# Patient Record
Sex: Male | Born: 1975 | Race: White | Hispanic: No | Marital: Single | Smoking: Never smoker
Health system: Southern US, Community
[De-identification: ages and names within clinical notes are randomized; demographics above are authoritative.]

## PROBLEM LIST (undated history)

## (undated) DIAGNOSIS — E785 Hyperlipidemia, unspecified: Secondary | ICD-10-CM

## (undated) DIAGNOSIS — D649 Anemia, unspecified: Secondary | ICD-10-CM

## (undated) DIAGNOSIS — E119 Type 2 diabetes mellitus without complications: Secondary | ICD-10-CM

## (undated) DIAGNOSIS — I4510 Unspecified right bundle-branch block: Secondary | ICD-10-CM

## (undated) DIAGNOSIS — I10 Essential (primary) hypertension: Secondary | ICD-10-CM

## (undated) HISTORY — PX: APPENDECTOMY: SHX54

## (undated) HISTORY — PX: TONSILLECTOMY: SUR1361

## (undated) HISTORY — DX: Essential (primary) hypertension: I10

## (undated) HISTORY — DX: Hyperlipidemia, unspecified: E78.5

## (undated) HISTORY — PX: COLONOSCOPY: SHX174

---

## 2018-08-20 ENCOUNTER — Telehealth: Payer: Self-pay | Admitting: Internal Medicine

## 2018-08-20 NOTE — Telephone Encounter (Signed)
Copied from CRM [id number]. Topic: General - Inquiry >> Aug 20, 2018 12:19 PM Donita Brooks wrote: Reason for CRM: Pt called wanting to know if Dr. Yetta Barre will accept him as a new pt. Pt was referred by Marla Roe

## 2018-08-20 NOTE — Telephone Encounter (Signed)
yes

## 2018-08-20 NOTE — Telephone Encounter (Signed)
Yes, I will see him  TJ 

## 2018-08-21 NOTE — Telephone Encounter (Signed)
Scheduled for tomorrow at 1:30, I put it in as an OV

## 2018-08-22 ENCOUNTER — Ambulatory Visit: Payer: Self-pay | Admitting: Internal Medicine

## 2018-08-27 DIAGNOSIS — I10 Essential (primary) hypertension: Secondary | ICD-10-CM | POA: Insufficient documentation

## 2018-09-02 DIAGNOSIS — E1165 Type 2 diabetes mellitus with hyperglycemia: Secondary | ICD-10-CM | POA: Insufficient documentation

## 2019-07-01 DIAGNOSIS — F900 Attention-deficit hyperactivity disorder, predominantly inattentive type: Secondary | ICD-10-CM | POA: Insufficient documentation

## 2021-04-07 ENCOUNTER — Other Ambulatory Visit: Payer: Self-pay

## 2021-04-07 ENCOUNTER — Emergency Department (HOSPITAL_BASED_OUTPATIENT_CLINIC_OR_DEPARTMENT_OTHER): Payer: 59

## 2021-04-07 ENCOUNTER — Emergency Department (HOSPITAL_BASED_OUTPATIENT_CLINIC_OR_DEPARTMENT_OTHER)
Admission: EM | Admit: 2021-04-07 | Discharge: 2021-04-07 | Disposition: A | Discharge status: Home / Self Care | Payer: 59 | Attending: Emergency Medicine | Admitting: Emergency Medicine

## 2021-04-07 ENCOUNTER — Encounter (HOSPITAL_BASED_OUTPATIENT_CLINIC_OR_DEPARTMENT_OTHER): Payer: Self-pay | Admitting: *Deleted

## 2021-04-07 DIAGNOSIS — Z23 Encounter for immunization: Secondary | ICD-10-CM | POA: Diagnosis not present

## 2021-04-07 DIAGNOSIS — W25XXXA Contact with sharp glass, initial encounter: Secondary | ICD-10-CM | POA: Insufficient documentation

## 2021-04-07 DIAGNOSIS — S81811A Laceration without foreign body, right lower leg, initial encounter: Secondary | ICD-10-CM | POA: Diagnosis not present

## 2021-04-07 DIAGNOSIS — S8991XA Unspecified injury of right lower leg, initial encounter: Secondary | ICD-10-CM | POA: Diagnosis present

## 2021-04-07 DIAGNOSIS — T1490XA Injury, unspecified, initial encounter: Secondary | ICD-10-CM

## 2021-04-07 MED ORDER — BUPIVACAINE HCL (PF) 0.5 % IJ SOLN
10.0000 mL | Freq: Once | INTRAMUSCULAR | Status: AC
  Administered 2021-04-07: 10 mL
  Filled 2021-04-07: qty 10

## 2021-04-07 MED ORDER — LIDOCAINE-EPINEPHRINE (PF) 2 %-1:200000 IJ SOLN
10.0000 mL | Freq: Once | INTRAMUSCULAR | Status: AC
  Administered 2021-04-07: 10 mL
  Filled 2021-04-07: qty 20

## 2021-04-07 MED ORDER — CEPHALEXIN 500 MG PO CAPS: 500.0000 mg | ORAL_CAPSULE | Freq: Four times a day (QID) | ORAL | 0 refills | Status: DC

## 2021-04-07 MED ORDER — TETANUS-DIPHTH-ACELL PERTUSSIS 5-2.5-18.5 LF-MCG/0.5 IM SUSY
0.5000 mL | PREFILLED_SYRINGE | Freq: Once | INTRAMUSCULAR | Status: AC
  Administered 2021-04-07: 0.5 mL via INTRAMUSCULAR
  Filled 2021-04-07: qty 0.5

## 2021-04-07 NOTE — ED Provider Notes (Signed)
[facility] EMERGENCY DEPT Provider Note   CSN: [id number] Arrival date & time: 04/07/21  1300     History Chief Complaint  Patient presents with   Laceration    Brandon Ruiz is a 45 y.o. male with no pertinent past medical history, unknown last tetanus shot, who presents today for evaluation of a laceration to his right calf.  He states that prior to arrival he accidentally cut his posterior right leg on a broken light bulb. He denies any weakness, numbness or tingling.  He states that the broken bulb was in the trash can and he has low suspicion for retained foreign body, he doesn't think it broke further when he cut his leg on it.   9 sutures, one horizontal and one vertical mattress  HPI     History reviewed. No pertinent past medical history.  There are no problems to display for this patient.   Past Surgical History:  Procedure Laterality Date   APPENDECTOMY         No family history on file.  Social History   Tobacco Use   Smoking status: Never   Smokeless tobacco: Never  Vaping Use   Vaping Use: Never used  Substance Use Topics   Alcohol use: Yes   Drug use: Never    Home Medications Prior to Admission medications   Medication Sig Start Date End Date Taking? Authorizing Provider  cephALEXin (KEFLEX) 500 MG capsule Take 1 capsule (500 mg total) by mouth 4 (four) times daily. 04/07/21  Yes Cristina Gong, PA-C    Allergies    Penicillins  Review of Systems   Review of Systems  Constitutional:  Negative for chills and fever.  Musculoskeletal:  Negative for joint swelling.  Skin:  Positive for wound.  Neurological:  Negative for weakness and numbness.  All other systems reviewed and are negative.  Physical Exam Updated Vital Signs BP (!) 136/93 (BP Location: Right Arm)   Pulse 88   Temp 98.1 F (36.7 C) (Temporal)   Resp 16   Ht 6\' 4"  (1.93 m)   Wt 120.2 kg   SpO2 98%   BMI 32.26 kg/m   Physical Exam Vitals and  nursing note reviewed.  Constitutional:      General: He is not in acute distress. HENT:     Head: Normocephalic and atraumatic.  Cardiovascular:     Rate and Rhythm: Normal rate.  Pulmonary:     Effort: Pulmonary effort is normal. No respiratory distress.  Musculoskeletal:     Cervical back: No rigidity.     Comments: Strength intact to right foot/leg  Skin:    Comments: 10cm V shaped laceration on the posterior right calf.  No active bleeding.  Please see clinical images.  Wound extends through dermis into subcutaneous tissues.    Neurological:     Mental Status: He is alert. Mental status is at baseline.     Sensory: No sensory deficit (Sensation intact to light touch to right foot).     Comments: Awake and alert, answers all questions appropriately.  Speech is not slurred.    Psychiatric:        Mood and Affect: Mood normal.       ED Results / Procedures / Treatments   Labs (all labs ordered are listed, but only abnormal results are displayed) Labs Reviewed - No data to display  EKG None  Radiology DG Tibia/Fibula Right Port  Addendum Date: 04/07/2021   ADDENDUM REPORT: 04/07/2021 16:34 ADDENDUM:  Corrected report: Original report was generated with an error, corrected as follows: Soft tissue density of the lower leg projecting between the tibia and fibula on AP view, likely a vascular calcification. Electronically Signed   By: Allegra Lai M.D.   On: 04/07/2021 16:34   Result Date: 04/07/2021 CLINICAL DATA:  Right lower leg laceration EXAM: PORTABLE RIGHT TIBIA AND FIBULA - 2 VIEW COMPARISON:  None. FINDINGS: There is no evidence of fracture or other focal bone lesions. Soft tissue density of the lower leg projecting between the radius and ulna on AP view, likely a vascular calcification. No radiopaque foreign body. IMPRESSION: No acute osseous abnormality. Electronically Signed: By: Allegra Lai M.D. On: 04/07/2021 14:32    Procedures .Marland KitchenLaceration  Repair  Date/Time: 04/07/2021 10:36 PM Performed by: Cristina Gong, PA-C Authorized by: Cristina Gong, PA-C   Consent:    Consent obtained:  Verbal   Consent given by:  Patient   Risks, benefits, and alternatives were discussed: yes     Risks discussed:  Infection, need for additional repair, poor cosmetic result, pain, retained foreign body, tendon damage, vascular damage, poor wound healing and nerve damage   Alternatives discussed:  No treatment and referral (Alternative wound closures) Universal protocol:    Procedure explained and questions answered to patient or proxy's satisfaction: yes   Anesthesia:    Anesthesia method:  Local infiltration   Local anesthetic: 50-50 mixture of bupivacaine 0.5% without epi, and lidocaine 2% with epi. Laceration details:    Location:  Leg   Leg location:  R lower leg   Length (cm):  10 Pre-procedure details:    Preparation:  Patient was prepped and draped in usual sterile fashion and imaging obtained to evaluate for foreign bodies Exploration:    Limited defect created (wound extended): no     Hemostasis achieved with:  Direct pressure   Imaging outcome: foreign body not noted     Wound exploration: wound explored through full range of motion and entire depth of wound visualized     Wound extent: areolar tissue violated and fascia violated (Small break what appears to be fascia, sub 1cm)     Wound extent: no foreign bodies/material noted, no muscle damage noted, no nerve damage noted, no tendon damage noted and no vascular damage noted     Wound extent comment:  Small   Contaminated: yes   Treatment:    Area cleansed with:  Saline and povidone-iodine   Amount of cleaning:  Extensive   Irrigation solution:  Sterile saline   Irrigation method:  Pressure wash   Layers repaired: Fascia was not repaired as defect is small, under 1 cm and placement of sutures would cause more trauma than benefit. Skin repair:    Repair method:   Sutures   Suture size:  4-0 and 3-0   Suture material:  Prolene   Suture technique:  Simple interrupted, horizontal mattress and vertical mattress (9 sutures total were placed, including 1 horizontal mattress and 1 vertical mattress suture)   Number of sutures:  9 Approximation:    Approximation:  Close Repair type:    Repair type:  Intermediate Post-procedure details:    Dressing:  Non-adherent dressing and bulky dressing   Procedure completion:  Tolerated well, no immediate complications   Medications Ordered in ED Medications  bupivacaine (MARCAINE) 0.5 % injection 10 mL (10 mLs Infiltration Given 04/07/21 1631)  lidocaine-EPINEPHrine (XYLOCAINE W/EPI) 2 %-1:200000 (PF) injection 10 mL (10 mLs Infiltration Given 04/07/21 1631)  Tdap (BOOSTRIX) injection 0.5 mL (0.5 mLs Intramuscular Given 04/07/21 1631)    ED Course  I have reviewed the triage vital signs and the nursing notes.  Pertinent labs & imaging results that were available during my care of the patient were reviewed by me and considered in my medical decision making (see chart for details).    MDM Rules/Calculators/A&P                          Patient is a healthy 45 year old man who presents today for evaluation of a isolated laceration to his right posterior leg.  Unknown last tetanus, tetanus is updated. X-rays obtained without fracture or radiopaque foreign body. I did discuss with patient the possibility of retained foreign body specifically with glass. Base of the wound was viewed in a clean bloodless field without obvious retained foreign body. Wound was extensively irrigated. There is a questionable defect in what appears to be fascia, however this is under 1 cm on exam.  Given the small size it was not repaired. Laceration was repaired, please see procedure note for details.  Patient tolerated repair well. He will require suture removal in 14 days. He is slightly hypertensive in the emergency room, recommended  re-check.    As he does have a history of diabetes we will give prescription for Keflex.  Discussed options for watchful waiting vs initiation of antibiotic treatment.  He does have a history of allergy to penicillins, however states that he has tolerated Keflex well in the past without difficulty  He was offered crutches, states he has some at home and declined.  Ace wrap placed by RN for added support.  Wound care is discussed with patient who states his understanding.  Return precautions were discussed with patient who states their understanding.  At the time of discharge patient denied any unaddressed complaints or concerns.  Patient is agreeable for discharge home.  Note: Portions of this report may have been transcribed using voice recognition software. Every effort was made to ensure accuracy; however, inadvertent computerized transcription errors may be present    Final Clinical Impression(s) / ED Diagnoses Final diagnoses:  Injury  Laceration of right calf    Rx / DC Orders ED Discharge Orders          Ordered    cephALEXin (KEFLEX) 500 MG capsule  4 times daily       Note to Pharmacy: Do not fill after 05/08/21   04/07/21 1740             Cristina Gong, PA-C 04/07/21 2245    Milagros Loll, MD 04/08/21 2137

## 2021-04-07 NOTE — ED Triage Notes (Signed)
Pt cut rt leg on broken light bulb PTA, bleeding controlled.

## 2021-04-07 NOTE — Discharge Instructions (Addendum)
Today you required 9 stitches.  As we discussed your stitches need to be removed in 10 to 14 days. Please keep your wound clean and dry and do not get it wet for the first 48 hours. After that you can let clean running water and soap run over it.  Your sutures need to be removed in 10 to 14 days. You can choose to either watch your wound and monitor it for signs of infection as we discussed or take the antibiotic.   If you develop fevers, have thick white drainage of purulent material, or have any fevers or other concerns please seek additional medical care and evaluation.  Please take Ibuprofen (Advil, motrin) and Tylenol (acetaminophen) to relieve your pain.    You may take up to 600 MG (3 pills) of normal strength ibuprofen every 8 hours as needed.   You make take tylenol, up to 1,000 mg (two extra strength pills) every 8 hours as needed.   It is safe to take ibuprofen and tylenol at the same time as they work differently.   Do not take more than 3,000 mg tylenol in a 24 hour period (not more than one dose every 8 hours.  Please check all medication labels as many medications such as pain and cold medications may contain tylenol.  Do not drink alcohol while taking these medications.  Do not take other NSAID'S while taking ibuprofen (such as aleve or naproxen).  Please take ibuprofen with food to decrease stomach upset.  You may have diarrhea from the antibiotics.  It is very important that you continue to take the antibiotics even if you get diarrhea unless a medical professional tells you that you may stop taking them.  If you stop too early the bacteria you are being treated for will become stronger and you may need different, more powerful antibiotics that have more side effects and worsening diarrhea.  Please stay well hydrated and consider probiotics as they may decrease the severity of your diarrhea.

## 2021-04-07 NOTE — ED Notes (Signed)
No acute distress noted upon this RN's departure of patient. Verified discharge paperwork with name and DOB. Vital signs stable. Patient taken to checkout window. Discharge paperwork discussed with patient. Patient given extra gauze for wound. No further questions voiced upon discharge.

## 2021-06-11 ENCOUNTER — Emergency Department (HOSPITAL_BASED_OUTPATIENT_CLINIC_OR_DEPARTMENT_OTHER)
Admission: EM | Admit: 2021-06-11 | Discharge: 2021-06-11 | Disposition: A | Discharge status: Home / Self Care | Payer: Managed Care, Other (non HMO) | Attending: Emergency Medicine | Admitting: Emergency Medicine

## 2021-06-11 ENCOUNTER — Encounter (HOSPITAL_BASED_OUTPATIENT_CLINIC_OR_DEPARTMENT_OTHER): Payer: Self-pay | Admitting: Emergency Medicine

## 2021-06-11 ENCOUNTER — Other Ambulatory Visit: Payer: Self-pay

## 2021-06-11 ENCOUNTER — Emergency Department (HOSPITAL_BASED_OUTPATIENT_CLINIC_OR_DEPARTMENT_OTHER): Payer: Managed Care, Other (non HMO)

## 2021-06-11 DIAGNOSIS — M79604 Pain in right leg: Secondary | ICD-10-CM | POA: Diagnosis present

## 2021-06-11 DIAGNOSIS — M7989 Other specified soft tissue disorders: Secondary | ICD-10-CM

## 2021-06-11 DIAGNOSIS — R2241 Localized swelling, mass and lump, right lower limb: Secondary | ICD-10-CM | POA: Diagnosis not present

## 2021-06-11 NOTE — ED Triage Notes (Signed)
Pt c/o posterior knee/upper calf pain for 1 weeks. Pt had 9 stitches to same lower leg approx 2 months ago, post lacerations from shattered light bulb.

## 2021-06-11 NOTE — ED Notes (Addendum)
Pt c/o right upper posterior calf pain, more painful with palpation.  Pt drove from [place] last night, states pain started about 1 week prior to his long drive.  Pt did not take his daily BP medication today.

## 2021-06-11 NOTE — Discharge Instructions (Addendum)
Your work-up is reassuring today.  You do not have a blood clot.  The pocket is consistent with a hematoma, there is no infection or anything that needs to be drained.  Follow-up with the attached orthopedics office as needed for this discomfort.

## 2021-06-11 NOTE — ED Provider Notes (Signed)
[facility] EMERGENCY DEPT Provider Note   CSN: [id number] Arrival date & time: 06/11/21  1307     History Chief Complaint  Patient presents with   Leg Pain    Brandon Ruiz is a 45 y.o. male with no pertinent past medical history presenting today with a complaint of right lower extremity swelling and pain.  Reports that in October he sustained a laceration to the posterior calf on his right leg.  This was sutured and healed with a elevated bump that was not painful.  Over the last few days he began to notice that he was having tenderness to the posterior calf proximal to this area as well as leg swelling.  He recently had a flight to [place].  No shortness of breath or chest pain.  Pain is worse when walking.  No history of DVT/PE.  Is concerned that there could possibly be an abscess or skin infection.   History reviewed. No pertinent past medical history.  There are no problems to display for this patient.   Past Surgical History:  Procedure Laterality Date   APPENDECTOMY         No family history on file.  Social History   Tobacco Use   Smoking status: Never   Smokeless tobacco: Never  Vaping Use   Vaping Use: Never used  Substance Use Topics   Alcohol use: Yes   Drug use: Never    Home Medications Prior to Admission medications   Medication Sig Start Date End Date Taking? Authorizing Provider  cephALEXin (KEFLEX) 500 MG capsule Take 1 capsule (500 mg total) by mouth 4 (four) times daily. 04/07/21   Cristina Gong, PA-C    Allergies    Penicillins  Review of Systems   Review of Systems  Constitutional:  Negative for chills and fever.  Respiratory:  Negative for chest tightness and shortness of breath.   Cardiovascular:  Negative for chest pain.  Musculoskeletal:  Negative for arthralgias and gait problem.  Skin:  Negative for wound.  Neurological:  Negative for weakness and numbness.  All other systems reviewed and are  negative.  Physical Exam Updated Vital Signs BP (!) 160/115 (BP Location: Right Arm)    Pulse (!) 105    Temp 98.2 F (36.8 C) (Oral)    Resp 16    SpO2 100%   Physical Exam Vitals and nursing note reviewed.  Constitutional:      Appearance: Normal appearance.  HENT:     Head: Normocephalic and atraumatic.  Eyes:     General: No scleral icterus.    Conjunctiva/sclera: Conjunctivae normal.  Pulmonary:     Effort: Pulmonary effort is normal. No respiratory distress.  Musculoskeletal:        General: Swelling present. Normal range of motion.     Comments: Right lower extremity swollen when compared to the left.  Strong pulses bilaterally.   2x2cm cyst to the posterior right calf.  Skin:    General: Skin is warm and dry.     Findings: No erythema or rash.  Neurological:     Mental Status: He is alert.  Psychiatric:        Mood and Affect: Mood normal.    ED Results / Procedures / Treatments   Labs (all labs ordered are listed, but only abnormal results are displayed) Labs Reviewed - No data to display  EKG None  Radiology US Venous Img Lower Right (DVT Study)  Result Date: 06/11/2021 CLINICAL DATA:  Right leg  swelling EXAM: Right LOWER EXTREMITY VENOUS DOPPLER ULTRASOUND TECHNIQUE: Gray-scale sonography with compression, as well as color and duplex ultrasound, were performed to evaluate the deep venous system(s) from the level of the common femoral vein through the popliteal and proximal calf veins. COMPARISON:  None. FINDINGS: VENOUS Normal compressibility of the common femoral, superficial femoral, and popliteal veins, as well as the visualized calf veins. Visualized portions of profunda femoral vein and great saphenous vein unremarkable. No filling defects to suggest DVT on grayscale or color Doppler imaging. Doppler waveforms show normal direction of venous flow, normal respiratory plasticity and response to augmentation. Limited views of the contralateral common femoral  vein are unremarkable. OTHER In the region of interest at the posterolateral upper calf region, there is a 5.7 x 1.3 x 2.9 cm heterogeneously hypoechoic subcutaneous collection without internal or increased surrounding Doppler flow. Limitations: none IMPRESSION: 1. No DVT identified. 2. Heterogeneous subcutaneous collection in the posterolateral upper calf region of interest as described. Appearance is suggestive of a hematoma, without definite evidence of abscess/infection, correlate clinically. Electronically Signed   By: Jannifer Hick M.D.   On: 06/11/2021 15:40    Procedures Procedures   Medications Ordered in ED Medications - No data to display  ED Course  I have reviewed the triage vital signs and the nursing notes.  Pertinent labs & imaging results that were available during my care of the patient were reviewed by me and considered in my medical decision making (see chart for details).  Clinical Course as of 06/11/21 1550  Sat Jun 11, 2021  1542 US Venous Img Lower Right (DVT Study) [MR]    Clinical Course User Index [MR] Johniya Durfee, Gabriel Cirri, PA-C   MDM Rules/Calculators/A&P Clementeen Graham is a 45 year old male presenting today due to right leg swelling and discomfort.  He is concerned about cellulitis or potential complication of the laceration repair.  That was done in October.  Patient was examined by myself and MD Dykstra.  His right lower extremity was swollen in comparison to the left.  He had strong pulses bilaterally.  The fluid collection on the back of his leg is not consistent with an abscess and there are no signs of cellulitic skin.   Workup: -DVT study was negative.   Conclusion: -Ultrasound states that the area on the back of his calf is consistent with a hematoma.  There is nothing for [place] to do about this today and he will follow-up with orthopedics as needed for his symptoms.  Signs and symptoms of DVT were discussed at length.  He expressed understanding and will  return if his symptoms get worse.  Final Clinical Impression(s) / ED Diagnoses Final diagnoses:  Right leg swelling    Rx / DC Orders Results and diagnoses were explained to the patient. Return precautions discussed in full. Patient had no additional questions and expressed complete understanding.     Saddie Benders, PA-C 06/11/21 1615    Milagros Loll, MD 06/13/21 [phone]

## 2021-06-11 NOTE — ED Notes (Signed)
No acute distress noted upon this RN's departure of patient. Verified discharge paperwork with name and DOB. Vital signs stable. Patient taken to checkout window. Discharge paperwork discussed with patient. No further questions voiced upon discharge.  ° °

## 2022-05-22 ENCOUNTER — Emergency Department (HOSPITAL_COMMUNITY): Payer: Managed Care, Other (non HMO) | Admitting: Anesthesiology

## 2022-05-22 ENCOUNTER — Other Ambulatory Visit: Payer: Self-pay

## 2022-05-22 ENCOUNTER — Emergency Department (HOSPITAL_BASED_OUTPATIENT_CLINIC_OR_DEPARTMENT_OTHER): Payer: Managed Care, Other (non HMO)

## 2022-05-22 ENCOUNTER — Encounter (HOSPITAL_BASED_OUTPATIENT_CLINIC_OR_DEPARTMENT_OTHER): Payer: Self-pay | Admitting: Radiology

## 2022-05-22 ENCOUNTER — Encounter (HOSPITAL_COMMUNITY): Payer: Self-pay

## 2022-05-22 ENCOUNTER — Inpatient Hospital Stay (HOSPITAL_BASED_OUTPATIENT_CLINIC_OR_DEPARTMENT_OTHER)
Admission: EM | Admit: 2022-05-22 | Discharge: 2022-05-24 | DRG: 872 | Disposition: A | Discharge status: Home / Self Care | Payer: Managed Care, Other (non HMO) | Attending: General Surgery | Admitting: General Surgery

## 2022-05-22 ENCOUNTER — Encounter (HOSPITAL_COMMUNITY): Admission: EM | Disposition: A | Discharge status: Home / Self Care | Payer: Self-pay | Source: Home / Self Care

## 2022-05-22 DIAGNOSIS — E669 Obesity, unspecified: Secondary | ICD-10-CM | POA: Diagnosis present

## 2022-05-22 DIAGNOSIS — Z880 Allergy status to penicillin: Secondary | ICD-10-CM

## 2022-05-22 DIAGNOSIS — L02214 Cutaneous abscess of groin: Secondary | ICD-10-CM | POA: Diagnosis present

## 2022-05-22 DIAGNOSIS — Z9049 Acquired absence of other specified parts of digestive tract: Secondary | ICD-10-CM

## 2022-05-22 DIAGNOSIS — A419 Sepsis, unspecified organism: Secondary | ICD-10-CM | POA: Diagnosis present

## 2022-05-22 DIAGNOSIS — N493 Fournier gangrene: Principal | ICD-10-CM

## 2022-05-22 DIAGNOSIS — Z6831 Body mass index (BMI) 31.0-31.9, adult: Secondary | ICD-10-CM | POA: Diagnosis not present

## 2022-05-22 DIAGNOSIS — Z1152 Encounter for screening for COVID-19: Secondary | ICD-10-CM

## 2022-05-22 DIAGNOSIS — F909 Attention-deficit hyperactivity disorder, unspecified type: Secondary | ICD-10-CM | POA: Diagnosis present

## 2022-05-22 DIAGNOSIS — I10 Essential (primary) hypertension: Secondary | ICD-10-CM | POA: Diagnosis present

## 2022-05-22 DIAGNOSIS — E1165 Type 2 diabetes mellitus with hyperglycemia: Secondary | ICD-10-CM | POA: Diagnosis present

## 2022-05-22 HISTORY — PX: INCISION AND DRAINAGE OF WOUND: SHX1803

## 2022-05-22 HISTORY — DX: Type 2 diabetes mellitus without complications: E11.9

## 2022-05-22 LAB — GLUCOSE, CAPILLARY
Glucose-Capillary: 241 mg/dL — ABNORMAL HIGH (ref 70–99)
Glucose-Capillary: 218 mg/dL — ABNORMAL HIGH (ref 70–99)
Glucose-Capillary: 218 mg/dL — ABNORMAL HIGH (ref 70–99)

## 2022-05-22 LAB — COMPREHENSIVE METABOLIC PANEL
ALT: 27 U/L (ref 0–44)
AST: 16 U/L (ref 15–41)
Albumin: 4 g/dL (ref 3.5–5.0)
Alkaline Phosphatase: 98 U/L (ref 38–126)
Anion gap: 16 — ABNORMAL HIGH (ref 5–15)
BUN: 13 mg/dL (ref 6–20)
CO2: 19 mmol/L — ABNORMAL LOW (ref 22–32)
Calcium: 9.7 mg/dL (ref 8.9–10.3)
Chloride: 96 mmol/L — ABNORMAL LOW (ref 98–111)
Creatinine, Ser: 0.6 mg/dL — ABNORMAL LOW (ref 0.61–1.24)
GFR, Estimated: >60 mL/min (ref >60)
Glucose, Bld: 305 mg/dL — ABNORMAL HIGH (ref 70–99)
Potassium: 4 mmol/L (ref 3.5–5.1)
Sodium: 131 mmol/L — ABNORMAL LOW (ref 135–145)
Total Bilirubin: 1.2 mg/dL (ref 0.3–1.2)
Total Protein: 7.4 g/dL (ref 6.5–8.1)

## 2022-05-22 LAB — CBC WITH DIFFERENTIAL/PLATELET
Abs Immature Granulocytes: 0.05 10*3/uL (ref 0.00–0.07)
Basophils Absolute: 0.1 10*3/uL (ref 0.0–0.1)
Basophils Relative: 0 %
Eosinophils Absolute: 0 10*3/uL (ref 0.0–0.5)
Eosinophils Relative: 0 %
HCT: 43.8 % (ref 39.0–52.0)
Hemoglobin: 15.9 g/dL (ref 13.0–17.0)
Immature Granulocytes: 0 %
Lymphocytes Relative: 8 %
Lymphs Abs: 0.9 10*3/uL (ref 0.7–4.0)
MCH: 31.7 pg (ref 26.0–34.0)
MCHC: 36.3 g/dL — ABNORMAL HIGH (ref 30.0–36.0)
MCV: 87.4 fL (ref 80.0–100.0)
Monocytes Absolute: 1.1 10*3/uL — ABNORMAL HIGH (ref 0.1–1.0)
Monocytes Relative: 10 %
Neutro Abs: 9.1 10*3/uL — ABNORMAL HIGH (ref 1.7–7.7)
Neutrophils Relative %: 82 %
Platelets: 267 10*3/uL (ref 150–400)
RBC: 5.01 MIL/uL (ref 4.22–5.81)
RDW: 12.7 % (ref 11.5–15.5)
WBC: 11.2 10*3/uL — ABNORMAL HIGH (ref 4.0–10.5)
nRBC: 0 % (ref 0.0–0.2)

## 2022-05-22 LAB — LACTIC ACID, PLASMA: Lactic Acid, Venous: 1 mmol/L (ref 0.5–1.9)

## 2022-05-22 LAB — PROTIME-INR
INR: 1.1 (ref 0.8–1.2)
Prothrombin Time: 14 seconds (ref 11.4–15.2)

## 2022-05-22 LAB — RESP PANEL BY RT-PCR (FLU A&B, COVID) ARPGX2
Influenza A by PCR: NEGATIVE
Influenza B by PCR: NEGATIVE
SARS Coronavirus 2 by RT PCR: NEGATIVE

## 2022-05-22 LAB — APTT: aPTT: 27 seconds (ref 24–36)

## 2022-05-22 SURGERY — IRRIGATION AND DEBRIDEMENT WOUND
Anesthesia: General | Laterality: Right

## 2022-05-22 MED ORDER — FENTANYL CITRATE PF 50 MCG/ML IJ SOSY
PREFILLED_SYRINGE | INTRAMUSCULAR | Status: AC
  Administered 2022-05-22: 50 ug via INTRAVENOUS
  Filled 2022-05-22: qty 3

## 2022-05-22 MED ORDER — LACTATED RINGERS IV BOLUS (SEPSIS)
1000.0000 mL | Freq: Once | INTRAVENOUS | Status: AC
  Administered 2022-05-22: 1000 mL via INTRAVENOUS

## 2022-05-22 MED ORDER — LIDOCAINE HCL (PF) 2 % IJ SOLN
INTRAMUSCULAR | Status: AC
  Filled 2022-05-22: qty 5

## 2022-05-22 MED ORDER — ENOXAPARIN SODIUM 40 MG/0.4ML IJ SOSY
40.0000 mg | PREFILLED_SYRINGE | INTRAMUSCULAR | Status: DC
  Administered 2022-05-23 – 2022-05-24 (×2): 40 mg via SUBCUTANEOUS
  Filled 2022-05-22 (×2): qty 0.4

## 2022-05-22 MED ORDER — ONDANSETRON HCL 4 MG/2ML IJ SOLN: 4.0000 mg | Freq: Four times a day (QID) | INTRAMUSCULAR | Status: DC | PRN

## 2022-05-22 MED ORDER — SUCCINYLCHOLINE CHLORIDE 200 MG/10ML IV SOSY
PREFILLED_SYRINGE | INTRAVENOUS | Status: AC
  Filled 2022-05-22: qty 10

## 2022-05-22 MED ORDER — FENTANYL CITRATE (PF) 100 MCG/2ML IJ SOLN
INTRAMUSCULAR | Status: DC | PRN
  Administered 2022-05-22 (×2): 50 ug via INTRAVENOUS

## 2022-05-22 MED ORDER — IBUPROFEN 400 MG PO TABS: 800.0000 mg | ORAL_TABLET | Freq: Once | ORAL | Status: DC

## 2022-05-22 MED ORDER — ONDANSETRON HCL 4 MG/2ML IJ SOLN
INTRAMUSCULAR | Status: AC
  Filled 2022-05-22: qty 2

## 2022-05-22 MED ORDER — MIDAZOLAM HCL 5 MG/5ML IJ SOLN
INTRAMUSCULAR | Status: DC | PRN
  Administered 2022-05-22: 2 mg via INTRAVENOUS

## 2022-05-22 MED ORDER — DEXTROSE-NACL 5-0.45 % IV SOLN: INTRAVENOUS | Status: DC

## 2022-05-22 MED ORDER — VANCOMYCIN HCL IN DEXTROSE 1-5 GM/200ML-% IV SOLN
1000.0000 mg | Freq: Once | INTRAVENOUS | Status: AC
  Administered 2022-05-22: 1000 mg via INTRAVENOUS
  Filled 2022-05-22: qty 200

## 2022-05-22 MED ORDER — ACETAMINOPHEN 650 MG RE SUPP: 650.0000 mg | Freq: Four times a day (QID) | RECTAL | Status: DC | PRN

## 2022-05-22 MED ORDER — MIDAZOLAM HCL 2 MG/2ML IJ SOLN
INTRAMUSCULAR | Status: AC
  Filled 2022-05-22: qty 2

## 2022-05-22 MED ORDER — ACETAMINOPHEN 325 MG PO TABS: 650.0000 mg | ORAL_TABLET | Freq: Four times a day (QID) | ORAL | Status: DC | PRN

## 2022-05-22 MED ORDER — ACETAMINOPHEN 10 MG/ML IV SOLN
INTRAVENOUS | Status: AC
  Filled 2022-05-22: qty 100

## 2022-05-22 MED ORDER — LIDOCAINE 2% (20 MG/ML) 5 ML SYRINGE
INTRAMUSCULAR | Status: DC | PRN
  Administered 2022-05-22: 60 mg via INTRAVENOUS

## 2022-05-22 MED ORDER — FENTANYL CITRATE PF 50 MCG/ML IJ SOSY: 25.0000 ug | PREFILLED_SYRINGE | INTRAMUSCULAR | Status: DC | PRN

## 2022-05-22 MED ORDER — BUPIVACAINE-EPINEPHRINE (PF) 0.5% -1:200000 IJ SOLN
INTRAMUSCULAR | Status: DC | PRN
  Administered 2022-05-22: 10 mL via PERINEURAL

## 2022-05-22 MED ORDER — DEXAMETHASONE SODIUM PHOSPHATE 10 MG/ML IJ SOLN
INTRAMUSCULAR | Status: DC | PRN
  Administered 2022-05-22: 4 mg via INTRAVENOUS

## 2022-05-22 MED ORDER — FENTANYL CITRATE (PF) 100 MCG/2ML IJ SOLN
INTRAMUSCULAR | Status: AC
  Filled 2022-05-22: qty 2

## 2022-05-22 MED ORDER — SODIUM CHLORIDE 0.9 % IV SOLN
2.0000 g | Freq: Three times a day (TID) | INTRAVENOUS | Status: DC
  Administered 2022-05-22 – 2022-05-24 (×6): 2 g via INTRAVENOUS
  Filled 2022-05-22 (×7): qty 12.5

## 2022-05-22 MED ORDER — PROPOFOL 10 MG/ML IV BOLUS
INTRAVENOUS | Status: AC
  Filled 2022-05-22: qty 20

## 2022-05-22 MED ORDER — SODIUM CHLORIDE 0.9 % IV SOLN: INTRAVENOUS | Status: DC | PRN

## 2022-05-22 MED ORDER — IOHEXOL 300 MG/ML  SOLN
100.0000 mL | Freq: Once | INTRAMUSCULAR | Status: AC | PRN
  Administered 2022-05-22: 100 mL via INTRAVENOUS

## 2022-05-22 MED ORDER — ONDANSETRON 4 MG PO TBDP: 4.0000 mg | ORAL_TABLET | Freq: Four times a day (QID) | ORAL | Status: DC | PRN

## 2022-05-22 MED ORDER — CHLORHEXIDINE GLUCONATE 0.12 % MT SOLN
15.0000 mL | Freq: Once | OROMUCOSAL | Status: AC
  Administered 2022-05-22: 15 mL via OROMUCOSAL

## 2022-05-22 MED ORDER — ONDANSETRON HCL 4 MG/2ML IJ SOLN
INTRAMUSCULAR | Status: DC | PRN
  Administered 2022-05-22: 4 mg via INTRAVENOUS

## 2022-05-22 MED ORDER — 0.9 % SODIUM CHLORIDE (POUR BTL) OPTIME
TOPICAL | Status: DC | PRN
  Administered 2022-05-22: 1000 mL

## 2022-05-22 MED ORDER — OXYCODONE HCL 5 MG PO TABS: 5.0000 mg | ORAL_TABLET | ORAL | Status: DC | PRN

## 2022-05-22 MED ORDER — AMISULPRIDE (ANTIEMETIC) 5 MG/2ML IV SOLN: 10.0000 mg | Freq: Once | INTRAVENOUS | Status: DC | PRN

## 2022-05-22 MED ORDER — ACETAMINOPHEN 500 MG PO TABS
1000.0000 mg | ORAL_TABLET | Freq: Once | ORAL | Status: DC
  Filled 2022-05-22: qty 2

## 2022-05-22 MED ORDER — LACTATED RINGERS IV BOLUS (SEPSIS): 1000.0000 mL | Freq: Once | INTRAVENOUS | Status: DC

## 2022-05-22 MED ORDER — MORPHINE SULFATE (PF) 2 MG/ML IV SOLN: 2.0000 mg | INTRAVENOUS | Status: DC | PRN

## 2022-05-22 MED ORDER — VANCOMYCIN HCL 1750 MG/350ML IV SOLN
1750.0000 mg | Freq: Two times a day (BID) | INTRAVENOUS | Status: DC
  Administered 2022-05-23 – 2022-05-24 (×3): 1750 mg via INTRAVENOUS
  Filled 2022-05-22 (×5): qty 350

## 2022-05-22 MED ORDER — KETOROLAC TROMETHAMINE 30 MG/ML IJ SOLN
INTRAMUSCULAR | Status: DC | PRN
  Administered 2022-05-22: 30 mg via INTRAVENOUS

## 2022-05-22 MED ORDER — DEXAMETHASONE SODIUM PHOSPHATE 10 MG/ML IJ SOLN
INTRAMUSCULAR | Status: AC
  Filled 2022-05-22: qty 1

## 2022-05-22 MED ORDER — ROCURONIUM BROMIDE 10 MG/ML (PF) SYRINGE
PREFILLED_SYRINGE | INTRAVENOUS | Status: AC
  Filled 2022-05-22: qty 10

## 2022-05-22 MED ORDER — INSULIN ASPART 100 UNIT/ML IJ SOLN
INTRAMUSCULAR | Status: AC
  Filled 2022-05-22: qty 1

## 2022-05-22 MED ORDER — KETOROLAC TROMETHAMINE 30 MG/ML IJ SOLN
INTRAMUSCULAR | Status: AC
  Filled 2022-05-22: qty 1

## 2022-05-22 MED ORDER — METRONIDAZOLE 500 MG/100ML IV SOLN
500.0000 mg | Freq: Once | INTRAVENOUS | Status: AC
  Administered 2022-05-22: 500 mg via INTRAVENOUS
  Filled 2022-05-22: qty 100

## 2022-05-22 MED ORDER — SODIUM CHLORIDE 0.9 % IV SOLN
2.0000 g | Freq: Once | INTRAVENOUS | Status: AC
  Administered 2022-05-22: 2 g via INTRAVENOUS
  Filled 2022-05-22: qty 12.5

## 2022-05-22 MED ORDER — PROPOFOL 10 MG/ML IV BOLUS
INTRAVENOUS | Status: DC | PRN
  Administered 2022-05-22: 50 mg via INTRAVENOUS
  Administered 2022-05-22: 120 mg via INTRAVENOUS

## 2022-05-22 MED ORDER — BUPIVACAINE-EPINEPHRINE (PF) 0.5% -1:200000 IJ SOLN
INTRAMUSCULAR | Status: AC
  Filled 2022-05-22: qty 30

## 2022-05-22 MED ORDER — METOPROLOL TARTRATE 5 MG/5ML IV SOLN: 5.0000 mg | Freq: Four times a day (QID) | INTRAVENOUS | Status: DC | PRN

## 2022-05-22 MED ORDER — INSULIN ASPART 100 UNIT/ML IJ SOLN
0.0000 [IU] | INTRAMUSCULAR | Status: DC
  Administered 2022-05-22 – 2022-05-23 (×2): 5 [IU] via SUBCUTANEOUS
  Administered 2022-05-23: 3 [IU] via SUBCUTANEOUS
  Administered 2022-05-23: 5 [IU] via SUBCUTANEOUS
  Administered 2022-05-23 (×2): 8 [IU] via SUBCUTANEOUS
  Administered 2022-05-23: 11 [IU] via SUBCUTANEOUS
  Administered 2022-05-24: 5 [IU] via SUBCUTANEOUS
  Administered 2022-05-24: 8 [IU] via SUBCUTANEOUS
  Administered 2022-05-24: 3 [IU] via SUBCUTANEOUS
  Administered 2022-05-24: 5 [IU] via SUBCUTANEOUS

## 2022-05-22 MED ORDER — INSULIN ASPART 100 UNIT/ML IJ SOLN: 4.0000 [IU] | Freq: Once | INTRAMUSCULAR | Status: DC

## 2022-05-22 MED ORDER — LACTATED RINGERS IV SOLN: INTRAVENOUS | Status: AC

## 2022-05-22 SURGICAL SUPPLY — 32 items
BAG COUNTER SPONGE SURGICOUNT (BAG) IMPLANT
BLADE SURG SZ11 CARB STEEL (BLADE) ×1 IMPLANT
CHLORAPREP W/TINT 26 (MISCELLANEOUS) ×1 IMPLANT
COVER SURGICAL LIGHT HANDLE (MISCELLANEOUS) ×1 IMPLANT
DRAIN PENROSE 0.5X18 (DRAIN) IMPLANT
DRAPE LAPAROSCOPIC ABDOMINAL (DRAPES) IMPLANT
DRAPE LAPAROTOMY TRNSV 102X78 (DRAPES) IMPLANT
DRAPE SHEET LG 3/4 BI-LAMINATE (DRAPES) IMPLANT
DRAPE UNDERBUTTOCKS STRL (DISPOSABLE) IMPLANT
ELECT REM PT RETURN 15FT ADLT (MISCELLANEOUS) ×1 IMPLANT
GAUZE PAD ABD 7.5X8 STRL (GAUZE/BANDAGES/DRESSINGS) IMPLANT
GAUZE PAD ABD 8X10 STRL (GAUZE/BANDAGES/DRESSINGS) IMPLANT
GAUZE SPONGE 4X4 12PLY STRL (GAUZE/BANDAGES/DRESSINGS) IMPLANT
GLOVE BIOGEL PI IND STRL 7.0 (GLOVE) ×1 IMPLANT
GLOVE SURG SS PI 7.0 STRL IVOR (GLOVE) ×1 IMPLANT
GOWN STRL REUS W/ TWL LRG LVL3 (GOWN DISPOSABLE) ×1 IMPLANT
GOWN STRL REUS W/ TWL XL LVL3 (GOWN DISPOSABLE) IMPLANT
GOWN STRL REUS W/TWL LRG LVL3 (GOWN DISPOSABLE) ×1
GOWN STRL REUS W/TWL XL LVL3 (GOWN DISPOSABLE)
KIT BASIN OR (CUSTOM PROCEDURE TRAY) ×1 IMPLANT
KIT TURNOVER KIT A (KITS) IMPLANT
LEGGING LITHOTOMY PAIR STRL (DRAPES) IMPLANT
NEEDLE HYPO 22GX1.5 SAFETY (NEEDLE) IMPLANT
PACK GENERAL/GYN (CUSTOM PROCEDURE TRAY) ×1 IMPLANT
SPIKE FLUID TRANSFER (MISCELLANEOUS) IMPLANT
SURGILUBE 2OZ TUBE FLIPTOP (MISCELLANEOUS) IMPLANT
SUT MNCRL AB 4-0 PS2 18 (SUTURE) IMPLANT
SUT SILK 2 0 SH (SUTURE) IMPLANT
SWAB COLLECTION DEVICE MRSA (MISCELLANEOUS) IMPLANT
SWAB CULTURE ESWAB REG 1ML (MISCELLANEOUS) IMPLANT
TOWEL OR 17X26 10 PK STRL BLUE (TOWEL DISPOSABLE) ×1 IMPLANT
TOWEL OR NON WOVEN STRL DISP B (DISPOSABLE) ×1 IMPLANT

## 2022-05-22 NOTE — Anesthesia Preprocedure Evaluation (Addendum)
Anesthesia Evaluation  Patient identified by MRN, date of birth, ID band Patient awake    Reviewed: Allergy & Precautions, NPO status , Patient's Chart, lab work & pertinent test results  Airway Mallampati: III  TM Distance: >3 FB Neck ROM: Full    Dental   Pulmonary neg pulmonary ROS   breath sounds clear to auscultation       Cardiovascular negative cardio ROS  Rhythm:Regular Rate:Normal     Neuro/Psych negative neurological ROS     GI/Hepatic negative GI ROS, Neg liver ROS,,,  Endo/Other  diabetes, Poorly Controlled, Type 2    Renal/GU negative Renal ROS     Musculoskeletal   Abdominal   Peds  Hematology negative hematology ROS (+)   Anesthesia Other Findings   Reproductive/Obstetrics                             Anesthesia Physical Anesthesia Plan  ASA: 2 and emergent  Anesthesia Plan: General   Post-op Pain Management: Ofirmev IV (intra-op)* and Toradol IV (intra-op)*   Induction: Intravenous and Rapid sequence  PONV Risk Score and Plan: 2 and Dexamethasone, Ondansetron and Treatment may vary due to age or medical condition  Airway Management Planned: LMA  Additional Equipment: None  Intra-op Plan:   Post-operative Plan: Extubation in OR  Informed Consent: I have reviewed the patients History and Physical, chart, labs and discussed the procedure including the risks, benefits and alternatives for the proposed anesthesia with the patient or authorized representative who has indicated his/her understanding and acceptance.     Dental advisory given  Plan Discussed with:   Anesthesia Plan Comments:        Anesthesia Quick Evaluation

## 2022-05-22 NOTE — Progress Notes (Signed)
Pharmacy Antibiotic Note  Valiant Dills is a 46 y.o. male admitted on 05/22/2022 with sepsis and cellulitis. Pharmacy has been consulted for vancomycin and cefepime dosing. Pt is febrile with Tmax 100.4 and WBC is elevated at 11.2. Scr and lactic acid are WNL. Pt was on doxycycline PTA.   Plan: Vancomycin 2gm IV x 1 then 1750mg  IV Q12H  Cefepime 2gm IV Q8H F/u renal fxn, C&S, clinical status and peak/trough at SS  Height: 6\' 4"  (193 cm) Weight: 116.1 kg (256 lb) IBW/kg (Calculated) : 86.8  Temp (24hrs), Avg:100.4 F (38 C), Min:100.4 F (38 C), Max:100.4 F (38 C)  No results for input(s): "WBC", "CREATININE", "LATICACIDVEN", "VANCOTROUGH", "VANCOPEAK", "VANCORANDOM", "GENTTROUGH", "GENTPEAK", "GENTRANDOM", "TOBRATROUGH", "TOBRAPEAK", "TOBRARND", "AMIKACINPEAK", "AMIKACINTROU", "AMIKACIN" in the last 168 hours.  CrCl cannot be calculated (No successful lab value found.).    Allergies  Allergen Reactions   Penicillins     Antimicrobials this admission: Vanc 11/27>> Cefepime 11/27>> Flagyl x 1 11/27  Dose adjustments this admission: N/A  Microbiology results: Pending  Thank you for allowing pharmacy to be a part of this patient's care.  Arrie Zuercher, 05/22/2022 10:41 AM

## 2022-05-22 NOTE — Op Note (Signed)
Preoperative diagnosis: right groin abscess  Postoperative diagnosis: same   Procedure: incision and drainage of right groin abscess  Surgeon: Feliciana Rossetti, M.D.  Asst: none  Anesthesia: general LMA  Indications for procedure: Brandon Ruiz is a 46 y.o. year old male with symptoms of right groin pain and swelling.  Description of procedure: The patient was brought into the operative suite. Anesthesia was administered with General LMA anesthesia. WHO checklist was applied. The patient was then placed in lithotomy position. The area was prepped and draped in the usual sterile fashion.  Next, an incision was made over the area of fluctuance. A moderate amount of purulence was expressed. Some was sent for culture. The area was edematous but there was no sign of necrotizing infection. A counter incision was made posteriorly. Digital dissection was performed to ensure no undrained collections. The tissue was inspected and appeared viable. A 1/2" penrose drain was looped between the incisions and sutured together with 2-0 silk. The wound was irrigated. A single gauze was packed into the wound for hemostasis. The wound was bandaged. The patient awoke from anesthesia and was brought to PACU in stable condition.  Findings: right groin abscess  Specimen: abscess for culture  Implant: 1/2" penrose drain   Blood loss: 10 ml  Local anesthesia:  10 ml marcaine   Complications: none  Feliciana Rossetti, M.D. General, Bariatric, & Minimally Invasive Surgery [facility]

## 2022-05-22 NOTE — ED Provider Notes (Signed)
3:19 PM Assumed care of patient from off-going team. For more details, please see note from same day.  In brief, this is a 46 y.o. male with HTN, T2DM, ADHD who presented to [facility] for c/f fournier's gangrene on CT. S/p cefepime, metronidazole, vancomycin IV. Was febrile/tachycardic at OSH. Also s/p tylenol, ibuprofen, and 2L LR.   Plan/Dispo at time of sign-out & ED Course since sign-out: [ ]  surgery  BP 132/84   Pulse (!) 105   Temp 99.5 F (37.5 C) (Oral)   Resp (!) 31   Ht 6\' 4"  (1.93 m)   Wt 116.1 kg   SpO2 95%   BMI 31.16 kg/m    ED Course:   Clinical Course as of 05/22/22 1636  Mon May 22, 2022  1058 WBC(!): 11.2 [MR]  1119 Lactic Acid, Venous: 1.0 [MR]  1535 Surgery paged [HN]  1544 Surgery coming to see [HN]    Clinical Course User Index [HN] 05/24/22, MD [MR] [place], May 24, 2022, PA-C    Dispo: Patient will be admitted to surgery and go to OR from ED ------------------------------- Loetta Rough, MD Emergency Medicine  This note was created using dictation software, which may contain spelling or grammatical errors.   Gabriel Cirri, MD 05/22/22 [phone]

## 2022-05-22 NOTE — ED Notes (Signed)
Pt arrived from [facility] for surgery consult due to abscess in rectal area that has worsened over the last several days. Alert and oriented x 4. No pain when still.

## 2022-05-22 NOTE — ED Triage Notes (Signed)
Pt c/o abscess in groin area wrapping around R leg to buttock. States it started as pimple, he popped it & it's gotten worse. Associated fever, went to UC & was prescribed doxy. States it began draining this AM Tylenol & doxy this AM

## 2022-05-22 NOTE — ED Notes (Signed)
Called [facility], spoke to Parker. Informed patient ready for ED to ED tx per PA request

## 2022-05-22 NOTE — Anesthesia Procedure Notes (Signed)
Procedure Name: LMA Insertion Date/Time: 05/22/2022 5:02 PM  Performed by: Epimenio Sarin, CRNAPre-anesthesia Checklist: Patient identified, Emergency Drugs available, Suction available, Patient being monitored and Timeout performed Patient Re-evaluated:Patient Re-evaluated prior to induction Oxygen Delivery Method: Circle system utilized Preoxygenation: Pre-oxygenation with 100% oxygen Induction Type: IV induction Ventilation: Mask ventilation without difficulty LMA: LMA inserted LMA Size: 5.0 Number of attempts: 1 Dental Injury: Teeth and Oropharynx as per pre-operative assessment

## 2022-05-22 NOTE — Consult Note (Addendum)
Brandon Ruiz Jun 18, 1976  [id number].    Requesting MD: Deretha Emory, MD Chief Complaint/Reason for Consult: perineal abscess  HPI:  Brandon Ruiz is a 46 y/o M with a PMH of hypertension and diabetes who presents to the ED with a cc groin pain. Reports pain in his right groin and thigh that started about 5 days ago. It started spontaneously draining at home and he was evaluated in urgent care 11/25 where bedside I&D was attempted and only bloody drainage evacuated. He was prescribed doxycycline and warm water soaks at this visit. He returns to the ED today with ongoing pain and discomfort in the area. Associated symptoms include fever. He denies history of previous abscess in this area. He denies tobacco use. Denies use of blood thinners. Reports he is allergic to penicillin. Surgical history: appendectomy.  ROS: as above Review of Systems  All other systems reviewed and are negative.   No family history on file.  History reviewed. No pertinent past medical history.  Past Surgical History:  Procedure Laterality Date   APPENDECTOMY      Social History:  reports that he has never smoked. He has never used smokeless tobacco. He reports current alcohol use. He reports that he does not use drugs.  Allergies:  Allergies  Allergen Reactions   Penicillins     (Not in a hospital admission)    Physical Exam: Blood pressure (!) 133/95, pulse (!) 112, temperature 99.5 F (37.5 C), temperature source Oral, resp. rate 17, height 6\' 4"  (1.93 m), weight 116.1 kg, SpO2 95 %. General: Pleasant white male  laying on hospital bed, appears stated age, NAD. HEENT: head -normocephalic, atraumatic; Eyes: PERRLA, no conjunctival injection; Ears- no external lesions or tenderness, TM visible with no redness or bulging; Nose: nonerythematous, no polyps/masses; Throat: pink mucosa, uvula midline, no exudates.  Neck- Trachea is midline, no thyromegaly or JVD appreciated.  CV- RRR, normal S1/S2, no M/R/G,  radial and dorsalis pedis pulses 2+ BL, cap refill < 2 seconds. Pulm- breathing is non-labored. CTABL, no wheezes, rhales, rhonchi. Abd- soft, NT/ND, appropriate bowel sounds in 4 quadrants, no masses, hernias, or organomegaly. GU- Right posterior groin area with cellulitis and 1 area of 5 mm opening. No crepitus. MSK- UE/LE symmetrical, no cyanosis, clubbing, or edema. Neuro- CN II-XII grossly in tact, no paresthesias. Psych- Alert and Oriented x3 with appropriate affect Skin: warm and dry, no rashes or lesions   Results for orders placed or performed during the hospital encounter of 05/22/22 (from the past 48 hour(s))  Resp Panel by RT-PCR (Flu A&B, Covid) Anterior Nasal Swab     Status: None   Collection Time: 05/22/22 10:34 AM   Specimen: Anterior Nasal Swab  Result Value Ref Range   SARS Coronavirus 2 by RT PCR NEGATIVE NEGATIVE    Comment: (NOTE) SARS-CoV-2 target nucleic acids are NOT DETECTED.  The SARS-CoV-2 RNA is generally detectable in upper respiratory specimens during the acute phase of infection. The lowest concentration of SARS-CoV-2 viral copies this assay can detect is 138 copies/mL. A negative result does not preclude SARS-Cov-2 infection and should not be used as the sole basis for treatment or other patient management decisions. A negative result may occur with  improper specimen collection/handling, submission of specimen other than nasopharyngeal swab, presence of viral mutation(s) within the areas targeted by this assay, and inadequate number of viral copies(<138 copies/mL). A negative result must be combined with clinical observations, patient history, and epidemiological information. The expected result is  Negative.  Fact Sheet for Patients:  BloggerCourse.com  Fact Sheet for Healthcare Providers:  SeriousBroker.it  This test is no t yet approved or cleared by the [place] FDA and  has been  authorized for detection and/or diagnosis of SARS-CoV-2 by FDA under an Emergency Use Authorization (EUA). This EUA will remain  in effect (meaning this test can be used) for the duration of the COVID-19 declaration under Section 564(b)(1) of the Act, 21 U.S.C.section 360bbb-3(b)(1), unless the authorization is terminated  or revoked sooner.       Influenza A by PCR NEGATIVE NEGATIVE   Influenza B by PCR NEGATIVE NEGATIVE    Comment: (NOTE) The Xpert Xpress SARS-CoV-2/FLU/RSV plus assay is intended as an aid in the diagnosis of influenza from Nasopharyngeal swab specimens and should not be used as a sole basis for treatment. Nasal washings and aspirates are unacceptable for Xpert Xpress SARS-CoV-2/FLU/RSV testing.  Fact Sheet for Patients: BloggerCourse.com  Fact Sheet for Healthcare Providers: SeriousBroker.it  This test is not yet approved or cleared by the [place] FDA and has been authorized for detection and/or diagnosis of SARS-CoV-2 by FDA under an Emergency Use Authorization (EUA). This EUA will remain in effect (meaning this test can be used) for the duration of the COVID-19 declaration under Section 564(b)(1) of the Act, 21 U.S.C. section 360bbb-3(b)(1), unless the authorization is terminated or revoked.  Performed at [facility], [address] [place], [place] [address]   Lactic acid, plasma     Status: None   Collection Time: 05/22/22 10:34 AM  Result Value Ref Range   Lactic Acid, Venous 1.0 0.5 - 1.9 mmol/L    Comment: Performed at [facility], [address] [place], [place] [address]  Comprehensive metabolic panel     Status: Abnormal   Collection Time: 05/22/22 10:34 AM  Result Value Ref Range   Sodium 131 (L) 135 - 145 mmol/L   Potassium 4.0 3.5 - 5.1 mmol/L   Chloride 96 (L) 98 - 111 mmol/L   CO2 19 (L) 22 - 32 mmol/L   Glucose, Bld 305 (H) 70 - 99 mg/dL     Comment: Glucose reference range applies only to samples taken after fasting for at least 8 hours.   BUN 13 6 - 20 mg/dL   Creatinine, Ser 8.83 (L) 0.61 - 1.24 mg/dL   Calcium 9.7 8.9 - 25.4 mg/dL   Total Protein 7.4 6.5 - 8.1 g/dL   Albumin 4.0 3.5 - 5.0 g/dL   AST 16 15 - 41 U/L   ALT 27 0 - 44 U/L   Alkaline Phosphatase 98 38 - 126 U/L   Total Bilirubin 1.2 0.3 - 1.2 mg/dL   GFR, Estimated >98 >26 mL/min    Comment: (NOTE) Calculated using the CKD-EPI Creatinine Equation (2021)    Anion gap 16 (H) 5 - 15    Comment: Performed at [facility], [address] [place], [place] [address]  CBC with Differential     Status: Abnormal   Collection Time: 05/22/22 10:34 AM  Result Value Ref Range   WBC 11.2 (H) 4.0 - 10.5 K/uL   RBC 5.01 4.22 - 5.81 MIL/uL   Hemoglobin 15.9 13.0 - 17.0 g/dL   HCT 09.4 07.6 - 80.8 %   MCV 87.4 80.0 - 100.0 fL   MCH 31.7 26.0 - 34.0 pg   MCHC 36.3 (H) 30.0 - 36.0 g/dL   RDW 81.1 03.1 - 59.4 %   Platelets 267  150 - 400 K/uL   nRBC 0.0 0.0 - 0.2 %   Neutrophils Relative % 82 %   Neutro Abs 9.1 (H) 1.7 - 7.7 K/uL   Lymphocytes Relative 8 %   Lymphs Abs 0.9 0.7 - 4.0 K/uL   Monocytes Relative 10 %   Monocytes Absolute 1.1 (H) 0.1 - 1.0 K/uL   Eosinophils Relative 0 %   Eosinophils Absolute 0.0 0.0 - 0.5 K/uL   Basophils Relative 0 %   Basophils Absolute 0.1 0.0 - 0.1 K/uL   Immature Granulocytes 0 %   Abs Immature Granulocytes 0.05 0.00 - 0.07 K/uL    Comment: Performed at [facility], [address], [place], [place] [address]  Protime-INR     Status: None   Collection Time: 05/22/22 10:34 AM  Result Value Ref Range   Prothrombin Time 14.0 11.4 - 15.2 seconds   INR 1.1 0.8 - 1.2    Comment: (NOTE) INR goal varies based on device and disease states. Performed at [facility], [address], [place], [place] [address]   APTT     Status: None   Collection Time: 05/22/22 10:34 AM   Result Value Ref Range   aPTT 27 24 - 36 seconds    Comment: Performed at [facility], [address], [place], [place] [address]   CT PELVIS W CONTRAST  Result Date: 05/22/2022 CLINICAL DATA:  Right groin abscess. EXAM: CT PELVIS WITH CONTRAST TECHNIQUE: Multidetector CT imaging of the pelvis was performed using the standard protocol following the bolus administration of intravenous contrast. RADIATION DOSE REDUCTION: This exam was performed according to the departmental dose-optimization program which includes automated exposure control, adjustment of the mA and/or kV according to patient size and/or use of iterative reconstruction technique. CONTRAST:  OMNIPAQUE IOHEXOL 300 MG/ML  SOLN COMPARISON:  None Available. FINDINGS: Urinary Tract:  Urinary bladder is unremarkable. Bowel:  Unremarkable visualized pelvic bowel loops. Vascular/Lymphatic: No significant vascular abnormality seen. Enlarged right inguinal lymph nodes are noted, the largest measuring 12 mm. These most likely are inflammatory in etiology. Reproductive:  Prostate is unremarkable. Other: No ascites or hernia is noted. There is extensive stranding of the subcutaneous tissues extending from the right inguinal region and through the right perineal region to the inferior portion of the right posterior thigh region. This is consistent with cellulitis. There is noted a 15 x 10 mm possible fluid collection in the right perineum with a small amount of gas present suggesting abscess or possible necrosis, best seen on image number 52 of series 4. Musculoskeletal: Moderate degenerative changes are seen involving both hip joints. No acute osseous abnormality is noted. IMPRESSION: Extensive inflammatory stranding is seen involving the subcutaneous tissues extending from the right inguinal region and through the right perineal region to the inferior portion of the right posterior thigh region, most consistent with cellulitis.  1.5 x 1.0 cm possible fluid collection is seen in the right perineum with a small amount of gas present suggesting abscess or possible necrosis, concerning for developing Fournier's gangrene. Enlarged right inguinal lymph nodes are also noted which most likely are inflammatory in etiology. 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 Electronically Signed   By: Lupita Raider M.D.   On: 05/22/2022 12:28      Assessment/Plan Abscess of the right groin - TMAX 100.4, sinus tachycardia (112 bpm), normotensive. WBC 11.2  - CT abdomen pelvis with significant soft tissue stranding of right groin, perineum, and thigh.  Possible small fluid collection (1.5 x 1.0 cm). - based on above information and physical exam I do suspect he has an abscess that needs further drainage in the OR. Recommend proceeding with I&D.   FEN - NPO, IVF VTE - SCD's ID - Vanc cefepime  [facility]  I reviewed last 24 h vitals and pain scores, last 48 h intake and output, last 24 h labs and trends, and last 24 h imaging results.  Feliciana RossettiLuke Herson Prichard, M.D. [facility] 05/22/2022, 3:43 PM Please see Amion for pager number during day hours 7:00am-4:30pm or 7:00am -11:30am on weekends

## 2022-05-22 NOTE — ED Provider Notes (Signed)
[facility] EMERGENCY DEPT Provider Note   CSN: [id number] Arrival date & time: 05/22/22  0840     History  Chief Complaint  Patient presents with   Abscess    Brandon Ruiz is a 46 y.o. male with a past medical history of hypertension presenting today with an abscess.  He reports that it was present prior to Thanksgiving.  On Saturday he went to urgent care and they tried to perform an I&D but only drained blood.  He says today he started to have some clear drainage.  Currently on doxycycline from urgent care.  No history of diabetes, IVDU or recurrent abscesses.  No dysuria, penile discharge or testicular pain.  Still having bowel movements.  He reports he has had subjective fevers over the last couple of days.   Abscess      Home Medications Prior to Admission medications   Medication Sig Start Date End Date Taking? Authorizing Provider  cephALEXin (KEFLEX) 500 MG capsule Take 1 capsule (500 mg total) by mouth 4 (four) times daily. 04/07/21   Cristina Gong, PA-C      Allergies    Penicillins    Review of Systems   Review of Systems  Physical Exam Updated Vital Signs BP (!) 140/103   Pulse (!) 127   Temp (!) 100.4 F (38 C) (Oral)   Resp 16   Ht 6\' 4"  (1.93 m)   Wt 116.1 kg   SpO2 95%   BMI 31.16 kg/m  Physical Exam Vitals and nursing note reviewed.  Constitutional:      General: He is not in acute distress.    Appearance: Normal appearance. He is not ill-appearing.  HENT:     Head: Normocephalic and atraumatic.  Eyes:     General: No scleral icterus.    Conjunctiva/sclera: Conjunctivae normal.  Cardiovascular:     Rate and Rhythm: Regular rhythm. Tachycardia present.  Pulmonary:     Effort: Pulmonary effort is normal. No respiratory distress.  Abdominal:     General: Abdomen is flat.     Palpations: Abdomen is soft.     Tenderness: There is no abdominal tenderness.  Genitourinary:    Comments: GU exam performed in presence of  nurse tech.  Patient with small pustule to the right groin with large amounts of induration extending from the medial thigh and just lateral to the perineum.  Erythematous and very TTP.  No testicular or penile involvement Skin:    Findings: No rash.  Neurological:     Mental Status: He is alert.  Psychiatric:        Mood and Affect: Mood normal.     ED Results / Procedures / Treatments   Labs (all labs ordered are listed, but only abnormal results are displayed) Labs Reviewed  CBC WITH DIFFERENTIAL/PLATELET - Abnormal; Notable for the following components:      Result Value   WBC 11.2 (*)    MCHC 36.3 (*)    Neutro Abs 9.1 (*)    Monocytes Absolute 1.1 (*)    All other components within normal limits  RESP PANEL BY RT-PCR (FLU A&B, COVID) ARPGX2  CULTURE, BLOOD (ROUTINE X 2)  CULTURE, BLOOD (ROUTINE X 2)  LACTIC ACID, PLASMA  PROTIME-INR  APTT  LACTIC ACID, PLASMA  COMPREHENSIVE METABOLIC PANEL    EKG None  Radiology No results found.  Procedures .Critical Care  Performed by: , PA-C Authorized by: Saddie Benders, PA-C   Critical care provider statement:  Critical care time (minutes):  75   Critical care time was exclusive of:  Separately billable procedures and treating other patients   Critical care was necessary to treat or prevent imminent or life-threatening deterioration of the following conditions:  Sepsis   Critical care was time spent personally by me on the following activities:  Development of treatment plan with patient or surrogate, discussions with consultants, discussions with primary provider, evaluation of patient's response to treatment, examination of patient, obtaining history from patient or surrogate, ordering and review of laboratory studies, ordering and review of radiographic studies, ordering and performing treatments and interventions, pulse oximetry, re-evaluation of patient's condition and review of old charts   I  assumed direction of critical care for this patient from another provider in my specialty: no     Care discussed with: accepting provider at another facility      Medications Ordered in ED Medications  lactated ringers infusion (has no administration in time range)  lactated ringers bolus 1,000 mL (has no administration in time range)    And  lactated ringers bolus 1,000 mL (has no administration in time range)    And  lactated ringers bolus 1,000 mL (has no administration in time range)    And  lactated ringers bolus 1,000 mL (has no administration in time range)  ceFEPIme (MAXIPIME) 2 g in sodium chloride 0.9 % 100 mL IVPB (has no administration in time range)  metroNIDAZOLE (FLAGYL) IVPB 500 mg (has no administration in time range)  vancomycin (VANCOCIN) IVPB 1000 mg/200 mL premix (has no administration in time range)  acetaminophen (TYLENOL) tablet 1,000 mg (has no administration in time range)    ED Course/ Medical Decision Making/ A&P Clinical Course as of 05/22/22 1119  Mon May 22, 2022  1058 WBC(!): 11.2 [MR]  1119 Lactic Acid, Venous: 1.0 [MR]    Clinical Course User Index [MR] Keanen Dohse, Gabriel Cirri, PA-C                           Medical Decision Making Amount and/or Complexity of Data Reviewed Labs: ordered. Decision-making details documented in ED Course. Radiology: ordered.  Risk OTC drugs. Prescription drug management. Decision regarding hospitalization.   46 year old male presenting with an abscess to the right groin.  Has been present for around a week.  Currently on doxycycline after an I&D with urgent care over the weekend.  Differential includes but is not limited to cellulitis, abscess, Fournier's gangrene/necrotizing fasciitis.  This is not an exhaustive differential.   Patient presented febrile, tachycardic with a known source of infection.  Sepsis protocol initiated.   Past Medical History / Co-morbidities / Social History: N/A   Additional  history: I reviewed patient's urgent care note on 11/25.  I&D appears to be productive of bloody drainage   Physical Exam: Pertinent physical exam findings include Large area of induration surrounding a pustule in the right groin.  Appears to have perineal involvement, no scrotal involvement  Lab Tests: I ordered, and personally interpreted labs.  The pertinent results include: Normal lactic WBC 11.2   Imaging Studies: I ordered and independently visualized and interpreted CT pelvis and I agree with the radiologist that there are areas on the CT scan concerning for developing necrotizing infection   Cardiac Monitoring:  The patient was maintained on a cardiac monitor.  I viewed and interpreted the cardiac monitored which showed an underlying rhythm of: sinus tach   Medications: Patient declined pain medication.  Broad-spectrum antibiotics ordered for sepsis secondary to cellulitis.   Consultations Obtained: I spoke with Callie with general surgery and she recommends admission to [facility] and they will see the patient there.  Will continue broad-spectrum antibiotics.  MDM/Disposition: This is a 46 year old male presenting with an abscess for a week.  I&D was failed a couple of days ago.  He is on doxycycline.  Physical exam concerning for deeper infection.  CT revealing of a developing necrotizing infection, concern for Fournier's gangrene.  Patient will be transferred ED to ED to Dr. Doran Durand.  Dr. Alvan Dame will be made aware on patient's arrival.  Patient is agreeable to this plan.   I discussed this case with my attending physician Dr. Deretha Emory who cosigned this note including patient's presenting symptoms, physical exam, and planned diagnostics and interventions. Attending physician stated agreement with plan or made changes to plan which were implemented.     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 Doran Durand accepting physician.   Saddie Benders, PA-C 05/22/22 1338    Vanetta Mulders, MD 05/31/22 [phone]

## 2022-05-22 NOTE — Transfer of Care (Signed)
Immediate Anesthesia Transfer of Care Note  Patient: Brandon Ruiz  Procedure(s) Performed: IRRIGATION AND DEBRIDEMENT RIGHT GROIN (Right)  Patient Location: PACU  Anesthesia Type:General  Level of Consciousness: drowsy and patient cooperative  Airway & Oxygen Therapy: Patient Spontanous Breathing and Patient connected to face mask oxygen  Post-op Assessment: Report given to RN and Post -op Vital signs reviewed and stable  Post vital signs: Reviewed and stable  Last Vitals:  Vitals Value Taken Time  BP 147/92 05/22/22 1739  Temp    Pulse 108 05/22/22 1742  Resp 29 05/22/22 1742  SpO2 97 % 05/22/22 1742  Vitals shown include unvalidated device data.  Last Pain:  Vitals:   05/22/22 1632  TempSrc: Oral  PainSc: 0-No pain      Patients Stated Pain Goal: 4 (05/22/22 1632)  Complications: No notable events documented.

## 2022-05-22 NOTE — Sepsis Progress Note (Signed)
Code sepsis protocol being monitored by eLink. 

## 2022-05-23 ENCOUNTER — Encounter (HOSPITAL_COMMUNITY): Payer: Self-pay | Admitting: General Surgery

## 2022-05-23 LAB — BASIC METABOLIC PANEL
Anion gap: 12 (ref 5–15)
BUN: 13 mg/dL (ref 6–20)
CO2: 23 mmol/L (ref 22–32)
Calcium: 8.8 mg/dL — ABNORMAL LOW (ref 8.9–10.3)
Chloride: 102 mmol/L (ref 98–111)
Creatinine, Ser: 0.48 mg/dL — ABNORMAL LOW (ref 0.61–1.24)
GFR, Estimated: >60 mL/min (ref >60)
Glucose, Bld: 235 mg/dL — ABNORMAL HIGH (ref 70–99)
Potassium: 4.1 mmol/L (ref 3.5–5.1)
Sodium: 137 mmol/L (ref 135–145)

## 2022-05-23 LAB — CBC
HCT: 41.7 % (ref 39.0–52.0)
Hemoglobin: 13.9 g/dL (ref 13.0–17.0)
MCH: 30.2 pg (ref 26.0–34.0)
MCHC: 33.3 g/dL (ref 30.0–36.0)
MCV: 90.5 fL (ref 80.0–100.0)
Platelets: 270 10*3/uL (ref 150–400)
RBC: 4.61 MIL/uL (ref 4.22–5.81)
RDW: 12.6 % (ref 11.5–15.5)
WBC: 10.5 10*3/uL (ref 4.0–10.5)
nRBC: 0 % (ref 0.0–0.2)

## 2022-05-23 LAB — GLUCOSE, CAPILLARY
Glucose-Capillary: 192 mg/dL — ABNORMAL HIGH (ref 70–99)
Glucose-Capillary: 239 mg/dL — ABNORMAL HIGH (ref 70–99)
Glucose-Capillary: 248 mg/dL — ABNORMAL HIGH (ref 70–99)
Glucose-Capillary: 257 mg/dL — ABNORMAL HIGH (ref 70–99)
Glucose-Capillary: 277 mg/dL — ABNORMAL HIGH (ref 70–99)
Glucose-Capillary: 285 mg/dL — ABNORMAL HIGH (ref 70–99)
Glucose-Capillary: 341 mg/dL — ABNORMAL HIGH (ref 70–99)

## 2022-05-23 LAB — HIV ANTIBODY (ROUTINE TESTING W REFLEX): HIV Screen 4th Generation wRfx: NONREACTIVE

## 2022-05-23 NOTE — Progress Notes (Signed)
  Transition of Care [facility]) Screening Note   Patient Details  Name: Shant Hence Date of Birth: 12-23-1975   Transition of Care [facility]) CM/SW Contact:    Amada Jupiter, LCSW Phone Number: 05/23/2022, 2:11 PM    Transition of Care Department [facility]) has reviewed patient and no TOC needs have been identified at this time. We will continue to monitor patient advancement through interdisciplinary progression rounds. If new patient transition needs arise, please place a TOC consult.

## 2022-05-23 NOTE — Inpatient Diabetes Management (Signed)
Inpatient Diabetes Program Recommendations  AACE/ADA: New Consensus Statement on Inpatient Glycemic Control (2015)  Target Ranges:  Prepandial:   less than 140 mg/dL      Peak postprandial:   less than 180 mg/dL (1-2 hours)      Critically ill patients:  140 - 180 mg/dL   Lab Results  Component Value Date   GLUCAP 239 (H) 05/23/2022    Review of Glycemic Control  Diabetes history: DM2 (previously on metformin years ago) Outpatient Diabetes medications: None Current orders for Inpatient glycemic control: Novolog 0-15 units Q4H  HgbA1C - pending  Inpatient Diabetes Program Recommendations:    Consider adding Semglee 15 units QHS  Novolog 5 units TID with meals if eating > 50%.  Long discussion at bedside regarding his diabetes control and importance of getting PCP to manage his diabetes. Pt states he has glucose meter at home to monitor blood sugars. Was on metformin years ago, although states he "didn't need it" when he lost weight and exercised on a regular basis. Likely pt needs to go home on insulin in order to control his blood sugars. Discussed hypoglycemia s/s and treatment. Stressed importance of PCP for his diabetes management post-discharge. Needs good glycemic control for healing. Ordered diabetes book and reviewed insulin pen administration. Answered all questions.  Will f/u in am.   Thank you. Ailene Ards, RD, LDN, [facility] Inpatient Diabetes Coordinator [phone]

## 2022-05-23 NOTE — Progress Notes (Signed)
1 Day Post-Op   Chief Complaint/Subjective: Pain improved  Objective: Vital signs in last 24 hours: Temp:  [97.9 F (36.6 C)-99.6 F (37.6 C)] 97.9 F (36.6 C) (11/28 0603) Pulse Rate:  [95-116] 95 (11/28 0603) Resp:  [15-32] 19 (11/28 0603) BP: (116-153)/(68-96) 129/82 (11/28 0603) SpO2:  [93 %-97 %] 94 % (11/28 0603)   Intake/Output from previous day: 11/27 0701 - 11/28 0700 In: 5408.6 [P.O.:1560; I.V.:2180.8; IV Piggyback:1667.7] Out: 602 [Urine:577; Blood:25] Intake/Output this shift: No intake/output data recorded.  PE: Gen: NAd Resp: nonlabored Card: RRR Abd: soft, NT Groin: erythema decreased from yesterday, drain in place, no purulent drainage  Lab Results:  Recent Labs    05/22/22 1034 05/23/22 0419  WBC 11.2* 10.5  HGB 15.9 13.9  HCT 43.8 41.7  PLT 267 270   BMET Recent Labs    05/22/22 1034 05/23/22 0419  NA 131* 137  K 4.0 4.1  CL 96* 102  CO2 19* 23  GLUCOSE 305* 235*  BUN 13 13  CREATININE 0.60* 0.48*  CALCIUM 9.7 8.8*   PT/INR Recent Labs    05/22/22 1034  LABPROT 14.0  INR 1.1   CMP     Component Value Date/Time   NA 137 05/23/2022 0419   K 4.1 05/23/2022 0419   CL 102 05/23/2022 0419   CO2 23 05/23/2022 0419   GLUCOSE 235 (H) 05/23/2022 0419   BUN 13 05/23/2022 0419   CREATININE 0.48 (L) 05/23/2022 0419   CALCIUM 8.8 (L) 05/23/2022 0419   PROT 7.4 05/22/2022 1034   ALBUMIN 4.0 05/22/2022 1034   AST 16 05/22/2022 1034   ALT 27 05/22/2022 1034   ALKPHOS 98 05/22/2022 1034   BILITOT 1.2 05/22/2022 1034   GFRNONAA >60 05/23/2022 0419   Lipase  No results found for: "LIPASE"  Studies/Results: CT PELVIS W CONTRAST  Result Date: 05/22/2022 CLINICAL DATA:  Right groin abscess. EXAM: CT PELVIS WITH CONTRAST TECHNIQUE: Multidetector CT imaging of the pelvis was performed using the standard protocol following the bolus administration of intravenous contrast. RADIATION DOSE REDUCTION: This exam was performed according to  the departmental dose-optimization program which includes automated exposure control, adjustment of the mA and/or kV according to patient size and/or use of iterative reconstruction technique. CONTRAST:  OMNIPAQUE IOHEXOL 300 MG/ML  SOLN COMPARISON:  None Available. FINDINGS: Urinary Tract:  Urinary bladder is unremarkable. Bowel:  Unremarkable visualized pelvic bowel loops. Vascular/Lymphatic: No significant vascular abnormality seen. Enlarged right inguinal lymph nodes are noted, the largest measuring 12 mm. These most likely are inflammatory in etiology. Reproductive:  Prostate is unremarkable. Other: No ascites or hernia is noted. There is extensive stranding of the subcutaneous tissues extending from the right inguinal region and through the right perineal region to the inferior portion of the right posterior thigh region. This is consistent with cellulitis. There is noted a 15 x 10 mm possible fluid collection in the right perineum with a small amount of gas present suggesting abscess or possible necrosis, best seen on image number 52 of series 4. Musculoskeletal: Moderate degenerative changes are seen involving both hip joints. No acute osseous abnormality is noted. IMPRESSION: Extensive inflammatory stranding is seen involving the subcutaneous tissues extending from the right inguinal region and through the right perineal region to the inferior portion of the right posterior thigh region, most consistent with cellulitis. 1.5 x 1.0 cm possible fluid collection is seen in the right perineum with a small amount of gas present suggesting abscess or possible necrosis, concerning  for developing Fournier's gangrene. Enlarged right inguinal lymph nodes are also noted which most likely are inflammatory in etiology. 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 Electronically Signed   By: Lupita Raider M.D.   On: 05/22/2022 12:28    Anti-infectives: Anti-infectives (From admission,  onward)    Start     Dose/Rate Route Frequency Ordered Stop   05/23/22 0500  vancomycin (VANCOREADY) IVPB 1750 mg/350 mL        1,750 mg 175 mL/hr over 120 Minutes Intravenous Every 12 hours 05/22/22 1223     05/22/22 2000  ceFEPIme (MAXIPIME) 2 g in sodium chloride 0.9 % 100 mL IVPB        2 g 200 mL/hr over 30 Minutes Intravenous Every 8 hours 05/22/22 1220     05/22/22 1200  vancomycin (VANCOCIN) IVPB 1000 mg/200 mL premix        1,000 mg 200 mL/hr over 60 Minutes Intravenous  Once 05/22/22 1041 05/22/22 1534   05/22/22 1015  ceFEPIme (MAXIPIME) 2 g in sodium chloride 0.9 % 100 mL IVPB        2 g 200 mL/hr over 30 Minutes Intravenous  Once 05/22/22 1009 05/22/22 1126   05/22/22 1015  metroNIDAZOLE (FLAGYL) IVPB 500 mg        500 mg 100 mL/hr over 60 Minutes Intravenous  Once 05/22/22 1009 05/22/22 1314   05/22/22 1015  vancomycin (VANCOCIN) IVPB 1000 mg/200 mL premix        1,000 mg 200 mL/hr over 60 Minutes Intravenous  Once 05/22/22 1009 05/22/22 1431       Assessment/Plan  s/p Procedure(s): IRRIGATION AND DEBRIDEMENT RIGHT GROIN 05/22/2022    FEN - carb mdo diet VTE - lovenox, scds ID - vanc, cefepime, pending culture Disposition - tight glc control, diabetes coordinator today, likely home tomorrow   LOS: 1 day   I reviewed last 24 h vitals and pain scores, last 48 h intake and output, last 24 h labs and trends, and last 24 h imaging results.  This care required high  level of medical decision making.   De Blanch [facility] 05/23/2022, 9:42 AM Please see Amion for pager number during day hours 7:00am-4:30pm or 7:00am -11:30am on weekends

## 2022-05-24 ENCOUNTER — Other Ambulatory Visit (HOSPITAL_BASED_OUTPATIENT_CLINIC_OR_DEPARTMENT_OTHER): Payer: Self-pay

## 2022-05-24 LAB — HEMOGLOBIN A1C
Hgb A1c MFr Bld: 12.5 % — ABNORMAL HIGH (ref 4.8–5.6)
Mean Plasma Glucose: 312 mg/dL

## 2022-05-24 LAB — GLUCOSE, CAPILLARY
Glucose-Capillary: 196 mg/dL — ABNORMAL HIGH (ref 70–99)
Glucose-Capillary: 209 mg/dL — ABNORMAL HIGH (ref 70–99)
Glucose-Capillary: 231 mg/dL — ABNORMAL HIGH (ref 70–99)

## 2022-05-24 MED ORDER — BLOOD GLUCOSE METER KIT
PACK | 0 refills | Status: DC
  Filled 2022-05-24: qty 1, fill #0

## 2022-05-24 MED ORDER — TRAMADOL HCL 50 MG PO TABS
50.0000 mg | ORAL_TABLET | Freq: Three times a day (TID) | ORAL | 0 refills | Status: DC | PRN
  Filled 2022-05-24: qty 10, 4d supply, fill #0

## 2022-05-24 MED ORDER — METFORMIN HCL 1000 MG PO TABS
1000.0000 mg | ORAL_TABLET | Freq: Two times a day (BID) | ORAL | 1 refills | Status: DC
  Filled 2022-05-24: qty 60, 30d supply, fill #0

## 2022-05-24 MED ORDER — INSULIN GLARGINE-YFGN 100 UNIT/ML ~~LOC~~ SOLN: 15.0000 [IU] | Freq: Every day | SUBCUTANEOUS | Status: DC

## 2022-05-24 MED ORDER — METFORMIN HCL 1000 MG PO TABS: 1000.0000 mg | ORAL_TABLET | Freq: Two times a day (BID) | ORAL | 1 refills | Status: DC

## 2022-05-24 MED ORDER — "PEN NEEDLES 3/16"" 31G X 5 MM MISC": 1.0000 | Freq: Four times a day (QID) | 2 refills | Status: AC

## 2022-05-24 MED ORDER — BLOOD GLUCOSE METER KIT: PACK | 0 refills | Status: DC

## 2022-05-24 MED ORDER — TRAMADOL HCL 50 MG PO TABS: 50.0000 mg | ORAL_TABLET | Freq: Three times a day (TID) | ORAL | 0 refills | Status: DC | PRN

## 2022-05-24 MED ORDER — BASAGLAR KWIKPEN 100 UNIT/ML ~~LOC~~ SOPN
15.0000 [IU] | PEN_INJECTOR | Freq: Every day | SUBCUTANEOUS | 0 refills | Status: DC
  Filled 2022-05-24: qty 4.5, 30d supply, fill #0

## 2022-05-24 MED ORDER — "PEN NEEDLES 3/16"" 31G X 5 MM MISC"
1.0000 | Freq: Four times a day (QID) | 2 refills | Status: DC
  Filled 2022-05-24: qty 200, fill #0

## 2022-05-24 MED ORDER — INSULIN GLARGINE-YFGN 100 UNIT/ML ~~LOC~~ SOLN
15.0000 [IU] | Freq: Every day | SUBCUTANEOUS | Status: DC
  Filled 2022-05-24: qty 0.15

## 2022-05-24 MED ORDER — INSULIN ASPART 100 UNIT/ML FLEXPEN: 5.0000 [IU] | PEN_INJECTOR | Freq: Three times a day (TID) | SUBCUTANEOUS | 1 refills | Status: DC

## 2022-05-24 MED ORDER — INSULIN ASPART 100 UNIT/ML IJ SOLN
5.0000 [IU] | Freq: Three times a day (TID) | INTRAMUSCULAR | Status: DC
  Administered 2022-05-24 (×2): 5 [IU] via SUBCUTANEOUS

## 2022-05-24 MED ORDER — BASAGLAR KWIKPEN 100 UNIT/ML ~~LOC~~ SOPN: 15.0000 [IU] | PEN_INJECTOR | Freq: Every day | SUBCUTANEOUS | 0 refills | Status: DC

## 2022-05-24 MED ORDER — INSULIN ASPART 100 UNIT/ML FLEXPEN
5.0000 [IU] | PEN_INJECTOR | Freq: Three times a day (TID) | SUBCUTANEOUS | 1 refills | Status: DC
  Filled 2022-05-24: qty 15, fill #0

## 2022-05-24 NOTE — Progress Notes (Signed)
Patient was given discharge instructions, and all questions were answered.  Patient was stable for discharge and was taken to the main exit by wheelchair. 

## 2022-05-24 NOTE — Discharge Summary (Signed)
[facility] Discharge Summary   Patient ID: Brandon Ruiz MRN: [id number] DOB/AGE: Dec 14, 1975 46 y.o.  Admit date: 05/22/2022 Discharge date: 05/24/2022  Admitting Diagnosis: Groin abscess  Discharge Diagnosis Patient Active Problem List   Diagnosis Date Noted   Abscess of right groin 05/22/2022   Attention deficit hyperactivity disorder (ADHD), predominantly inattentive type 07/01/2019   Uncontrolled type 2 diabetes mellitus with hyperglycemia ([place]) 09/02/2018   Essential hypertension 08/27/2018    Consultants Diabetes coordinator   Imaging: No results found.  Procedures Dr. Lurena Joiner Kinsinger (05/22/22) -  incision and drainage of right groin abscess, placement of penrose drain  Hospital Course:  46 y/o M who presented to the ED with groin pain and drainage.  Workup showed groin abscess.  Patient was admitted and underwent procedure listed above.  Tolerated procedure well and was transferred to the floor.  Diet was advanced as tolerated. The patient was hyperglycemic during admission and his A1c was 12.5%. Diabetes coordinator was consulting for medication assistance. On POD#2, the patient was voiding well, tolerating diet, ambulating well, pain well controlled, vital signs stable, incisions c/d/i and felt stable for discharge home.  He was discharged on the below medications and with instructions to see a PCP within 2 weeks. He expressed understanding and all of his questions were answered. Patient will follow up in our office was below for a wound check and knows to call with questions or concerns.  He will call to confirm appointment date/time.    I have personally reviewed the patients medication history on the [place] controlled substance database.   Allergies as of 05/24/2022       Reactions   Penicillins Itching        Medication List     STOP taking these medications    cephALEXin 500 MG capsule Commonly known as: KEFLEX   doxycycline 100 MG  capsule Commonly known as: VIBRAMYCIN       TAKE these medications    acetaminophen 500 MG tablet Commonly known as: TYLENOL Take 500-1,500 mg by mouth 2 (two) times daily as needed for fever (for pain).   Basaglar KwikPen 100 UNIT/ML Inject 15 Units into the skin at bedtime.   blood glucose meter kit and supplies Dispense based on patient and insurance preference. Use up to four times daily as directed. (FOR ICD-10 E10.9, E11.9).   insulin aspart 100 UNIT/ML FlexPen Commonly known as: NOVOLOG Inject 5 Units into the skin 3 (three) times daily with meals.   metFORMIN 1000 MG tablet Commonly known as: GLUCOPHAGE Take 1 tablet (1,000 mg total) by mouth 2 (two) times daily with a meal.   Pen Needles 3/16" 31G X 5 MM Misc 1 Needle by Does not apply route 4 (four) times daily. Use a new needle each time you use your insulin pen (3 times daily novolog, once at bedtime)   traMADol 50 MG tablet Commonly known as: ULTRAM Take 1 tablet (50 mg total) by mouth every 8 (eight) hours as needed for up to 10 doses.          Follow-up Information     [facility] Follow up on 06/13/2022.   Why: 06/13/22 at 10:30 am for wound check. please arrive 30 minutes early. Contact information: [address] [place] [address]        Janith Lima, MD. Schedule an appointment as soon as possible for a visit in 1 week(s).   Specialty: Internal Medicine Why: for  urgent follow up of diabetes Contact information: [place] [address] [phone]                 Signed: Obie Dredge, [facility] 05/24/2022, 2:55 PM

## 2022-05-24 NOTE — Inpatient Diabetes Management (Signed)
Inpatient Diabetes Program Recommendations  AACE/ADA: New Consensus Statement on Inpatient Glycemic Control (2015)  Target Ranges:  Prepandial:   less than 140 mg/dL      Peak postprandial:   less than 180 mg/dL (1-2 hours)      Critically ill patients:  140 - 180 mg/dL   Lab Results  Component Value Date   GLUCAP 231 (H) 05/24/2022   HGBA1C 12.5 (H) 05/22/2022    Review of Glycemic Control  Diabetes history: DM2  Current orders for Inpatient glycemic control: Semglee 15 QHS, Novolog 0-15 Q4H + 5 units TID  Inpatient Diabetes Program Recommendations:    Lantus/Semglee (pen) 15 units QHS Novolog (pen) 5 units TID with meals Metformin 1000 mg BID Insulin pen needles (#[id number]) Blood glucose meter kit (#[id number])  Pt is in process of obtaining PCP - Dr Scarlette Calico at [facility].  Would benefit from OP Diabetes Education.  Spoke with pt at bedside regarding going home on insulin. Stressed importance of f/u with PCP within the next couple of weeks. Instructed to monitor blood sugars at least 3-4x/day and take logbook to PCP visit. Reviewed diabetes survival skills. Discussed HgbA1C of 12.5% and goal of <7%. Also discussed hypoglycemia s/s and treatment. Answered all questions.  Thank you. Lorenda Peck, RD, LDN, [place] Inpatient Diabetes Coordinator [phone]

## 2022-05-24 NOTE — Anesthesia Postprocedure Evaluation (Signed)
Anesthesia Post Note  Patient: Brandon Ruiz  Procedure(s) Performed: IRRIGATION AND DEBRIDEMENT RIGHT GROIN (Right)     Patient location during evaluation: PACU Anesthesia Type: General Level of consciousness: awake and alert Pain management: pain level controlled Vital Signs Assessment: post-procedure vital signs reviewed and stable Respiratory status: spontaneous breathing, nonlabored ventilation, respiratory function stable and patient connected to nasal cannula oxygen Cardiovascular status: blood pressure returned to baseline and stable Postop Assessment: no apparent nausea or vomiting Anesthetic complications: no   No notable events documented.  Last Vitals:  Vitals:   05/24/22 0648 05/24/22 0845  BP: 137/85 (!) 136/103  Pulse: 91   Resp: 18 18  Temp: 36.4 C 36.9 C  SpO2: 97% 98%    Last Pain:  Vitals:   05/24/22 0943  TempSrc:   PainSc: 0-No pain                 Kennieth Rad

## 2022-05-24 NOTE — Progress Notes (Addendum)
2 Days Post-Op   Chief Complaint/Subjective: Pain well controlled, tolerating diet  Objective: Vital signs in last 24 hours: Temp:  [97.6 F (36.4 C)-98.6 F (37 C)] 98.4 F (36.9 C) (11/29 0845) Pulse Rate:  [91-102] 91 (11/29 0648) Resp:  [16-18] 18 (11/29 0845) BP: (132-144)/(79-103) 136/103 (11/29 0845) SpO2:  [96 %-98 %] 98 % (11/29 0845)   Intake/Output from previous day: 11/28 0701 - 11/29 0700 In: 3891 [P.O.:720; I.V.:2338.8; IV Piggyback:832.3] Out: 0  Intake/Output this shift: No intake/output data recorded.  PE: Gen: NAD Resp: nonlabored Card: RRR Abd: soft, groin wound clean without much drainage  Lab Results:  Recent Labs    05/22/22 1034 05/23/22 0419  WBC 11.2* 10.5  HGB 15.9 13.9  HCT 43.8 41.7  PLT 267 270   BMET Recent Labs    05/22/22 1034 05/23/22 0419  NA 131* 137  K 4.0 4.1  CL 96* 102  CO2 19* 23  GLUCOSE 305* 235*  BUN 13 13  CREATININE 0.60* 0.48*  CALCIUM 9.7 8.8*   PT/INR Recent Labs    05/22/22 1034  LABPROT 14.0  INR 1.1   CMP     Component Value Date/Time   NA 137 05/23/2022 0419   K 4.1 05/23/2022 0419   CL 102 05/23/2022 0419   CO2 23 05/23/2022 0419   GLUCOSE 235 (H) 05/23/2022 0419   BUN 13 05/23/2022 0419   CREATININE 0.48 (L) 05/23/2022 0419   CALCIUM 8.8 (L) 05/23/2022 0419   PROT 7.4 05/22/2022 1034   ALBUMIN 4.0 05/22/2022 1034   AST 16 05/22/2022 1034   ALT 27 05/22/2022 1034   ALKPHOS 98 05/22/2022 1034   BILITOT 1.2 05/22/2022 1034   GFRNONAA >60 05/23/2022 0419   Lipase  No results found for: "LIPASE"  Studies/Results: CT PELVIS W CONTRAST  Result Date: 05/22/2022 CLINICAL DATA:  Right groin abscess. EXAM: CT PELVIS WITH CONTRAST TECHNIQUE: Multidetector CT imaging of the pelvis was performed using the standard protocol following the bolus administration of intravenous contrast. RADIATION DOSE REDUCTION: This exam was performed according to the departmental dose-optimization program  which includes automated exposure control, adjustment of the mA and/or kV according to patient size and/or use of iterative reconstruction technique. CONTRAST:  OMNIPAQUE IOHEXOL 300 MG/ML  SOLN COMPARISON:  None Available. FINDINGS: Urinary Tract:  Urinary bladder is unremarkable. Bowel:  Unremarkable visualized pelvic bowel loops. Vascular/Lymphatic: No significant vascular abnormality seen. Enlarged right inguinal lymph nodes are noted, the largest measuring 12 mm. These most likely are inflammatory in etiology. Reproductive:  Prostate is unremarkable. Other: No ascites or hernia is noted. There is extensive stranding of the subcutaneous tissues extending from the right inguinal region and through the right perineal region to the inferior portion of the right posterior thigh region. This is consistent with cellulitis. There is noted a 15 x 10 mm possible fluid collection in the right perineum with a small amount of gas present suggesting abscess or possible necrosis, best seen on image number 52 of series 4. Musculoskeletal: Moderate degenerative changes are seen involving both hip joints. No acute osseous abnormality is noted. IMPRESSION: Extensive inflammatory stranding is seen involving the subcutaneous tissues extending from the right inguinal region and through the right perineal region to the inferior portion of the right posterior thigh region, most consistent with cellulitis. 1.5 x 1.0 cm possible fluid collection is seen in the right perineum with a small amount of gas present suggesting abscess or possible necrosis, concerning for developing Fournier's gangrene.  Enlarged right inguinal lymph nodes are also noted which most likely are inflammatory in etiology. 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 Electronically Signed   By: Lupita Raider M.D.   On: 05/22/2022 12:28    Anti-infectives: Anti-infectives (From admission, onward)    Start     Dose/Rate Route Frequency  Ordered Stop   05/23/22 0500  vancomycin (VANCOREADY) IVPB 1750 mg/350 mL        1,750 mg 175 mL/hr over 120 Minutes Intravenous Every 12 hours 05/22/22 1223     05/22/22 2000  ceFEPIme (MAXIPIME) 2 g in sodium chloride 0.9 % 100 mL IVPB        2 g 200 mL/hr over 30 Minutes Intravenous Every 8 hours 05/22/22 1220     05/22/22 1200  vancomycin (VANCOCIN) IVPB 1000 mg/200 mL premix        1,000 mg 200 mL/hr over 60 Minutes Intravenous  Once 05/22/22 1041 05/22/22 1534   05/22/22 1015  ceFEPIme (MAXIPIME) 2 g in sodium chloride 0.9 % 100 mL IVPB        2 g 200 mL/hr over 30 Minutes Intravenous  Once 05/22/22 1009 05/22/22 1126   05/22/22 1015  metroNIDAZOLE (FLAGYL) IVPB 500 mg        500 mg 100 mL/hr over 60 Minutes Intravenous  Once 05/22/22 1009 05/22/22 1314   05/22/22 1015  vancomycin (VANCOCIN) IVPB 1000 mg/200 mL premix        1,000 mg 200 mL/hr over 60 Minutes Intravenous  Once 05/22/22 1009 05/22/22 1431       Assessment/Plan  s/p Procedure(s): IRRIGATION AND DEBRIDEMENT RIGHT GROIN 05/22/2022  A1c 12.5 consistent with uncontrolled diabetes mellitus with complication of skin infection - start semglee and aspart with meals  Diabetes mellitus - A1C 12.5 Obesity - BMI 31  FEN - carb mod diet VTE - lovenox ID - vac cefepime, pending culture Disposition - follow up DM coordinator recs, follow up culture results, likely transition to oral abx and home today   LOS: 2 days   I reviewed last 24 h vitals and pain scores, last 48 h intake and output, last 24 h labs and trends, and last 24 h imaging results.  This care required high  level of medical decision making.   De Blanch [facility] 05/24/2022, 8:59 AM Please see Amion for pager number during day hours 7:00am-4:30pm or 7:00am -11:30am on weekends

## 2022-05-24 NOTE — Progress Notes (Signed)
Mobility Specialist - Progress Note   05/24/22 1415  Mobility  Activity Ambulated independently in hallway  Level of Assistance Independent  Assistive Device None  Distance Ambulated (ft) 500 ft  Activity Response Tolerated well  Mobility Referral Yes  $Mobility charge 1 Mobility   Pt received in bed and agreeable to mobility. No complaints during mobility. Pt to room standing after session with all needs met.      [facility]

## 2022-05-26 LAB — AEROBIC/ANAEROBIC CULTURE W GRAM STAIN (SURGICAL/DEEP WOUND)

## 2022-05-27 LAB — CULTURE, BLOOD (ROUTINE X 2)
Culture: NO GROWTH
Culture: NO GROWTH

## 2022-07-27 ENCOUNTER — Ambulatory Visit: Payer: Managed Care, Other (non HMO) | Admitting: Dietician

## 2022-08-03 ENCOUNTER — Ambulatory Visit (INDEPENDENT_AMBULATORY_CARE_PROVIDER_SITE_OTHER): Payer: Managed Care, Other (non HMO) | Admitting: Internal Medicine

## 2022-08-03 ENCOUNTER — Encounter: Payer: Self-pay | Admitting: Internal Medicine

## 2022-08-03 VITALS — BP 158/98 | HR 100 | Temp 98.2°F | Resp 16 | Ht 76.0 in | Wt 248.0 lb

## 2022-08-03 DIAGNOSIS — E785 Hyperlipidemia, unspecified: Secondary | ICD-10-CM

## 2022-08-03 DIAGNOSIS — R809 Proteinuria, unspecified: Secondary | ICD-10-CM

## 2022-08-03 DIAGNOSIS — E1129 Type 2 diabetes mellitus with other diabetic kidney complication: Secondary | ICD-10-CM | POA: Diagnosis not present

## 2022-08-03 DIAGNOSIS — R0683 Snoring: Secondary | ICD-10-CM

## 2022-08-03 DIAGNOSIS — I10 Essential (primary) hypertension: Secondary | ICD-10-CM | POA: Diagnosis not present

## 2022-08-03 DIAGNOSIS — Z0001 Encounter for general adult medical examination with abnormal findings: Secondary | ICD-10-CM

## 2022-08-03 DIAGNOSIS — E119 Type 2 diabetes mellitus without complications: Secondary | ICD-10-CM | POA: Insufficient documentation

## 2022-08-03 DIAGNOSIS — Z794 Long term (current) use of insulin: Secondary | ICD-10-CM

## 2022-08-03 DIAGNOSIS — Z1211 Encounter for screening for malignant neoplasm of colon: Secondary | ICD-10-CM | POA: Insufficient documentation

## 2022-08-03 DIAGNOSIS — Z1159 Encounter for screening for other viral diseases: Secondary | ICD-10-CM | POA: Insufficient documentation

## 2022-08-03 LAB — LIPID PANEL
Cholesterol: 164 mg/dL (ref 0–200)
HDL: 27.1 mg/dL — ABNORMAL LOW (ref >39.00)
NonHDL: 136.48
Total CHOL/HDL Ratio: 6
Triglycerides: 389 mg/dL — ABNORMAL HIGH (ref 0.0–149.0)
VLDL: 77.8 mg/dL — ABNORMAL HIGH (ref 0.0–40.0)

## 2022-08-03 LAB — MICROALBUMIN / CREATININE URINE RATIO
Creatinine,U: 82.2 mg/dL
Microalb Creat Ratio: 10.9 mg/g (ref 0.0–30.0)
Microalb, Ur: 9 mg/dL — ABNORMAL HIGH (ref 0.0–1.9)

## 2022-08-03 LAB — BASIC METABOLIC PANEL
BUN: 12 mg/dL (ref 6–23)
CO2: 26 mEq/L (ref 19–32)
Calcium: 9.7 mg/dL (ref 8.4–10.5)
Chloride: 104 mEq/L (ref 96–112)
Creatinine, Ser: 0.68 mg/dL (ref 0.40–1.50)
GFR: 111.55 mL/min (ref >60.00)
Glucose, Bld: 131 mg/dL — ABNORMAL HIGH (ref 70–99)
Potassium: 4.2 mEq/L (ref 3.5–5.1)
Sodium: 140 mEq/L (ref 135–145)

## 2022-08-03 LAB — URINALYSIS, ROUTINE W REFLEX MICROSCOPIC
Bilirubin Urine: NEGATIVE
Hgb urine dipstick: NEGATIVE
Ketones, ur: NEGATIVE
Leukocytes,Ua: NEGATIVE
Nitrite: NEGATIVE
RBC / HPF: NONE SEEN (ref 0–?)
Specific Gravity, Urine: 1.02 (ref 1.000–1.030)
Total Protein, Urine: NEGATIVE
Urine Glucose: NEGATIVE
Urobilinogen, UA: 0.2 (ref 0.0–1.0)
pH: 7 (ref 5.0–8.0)

## 2022-08-03 LAB — PSA: PSA: 0.93 ng/mL (ref 0.10–4.00)

## 2022-08-03 LAB — LDL CHOLESTEROL, DIRECT: Direct LDL: 85 mg/dL

## 2022-08-03 LAB — TSH: TSH: 0.71 u[IU]/mL (ref 0.35–5.50)

## 2022-08-03 LAB — HEMOGLOBIN A1C: Hgb A1c MFr Bld: 7.7 % — ABNORMAL HIGH (ref 4.6–6.5)

## 2022-08-03 MED ORDER — DEXCOM G7 SENSOR MISC: 1.0000 | Freq: Every day | 1 refills | Status: DC

## 2022-08-03 MED ORDER — DEXCOM G7 RECEIVER DEVI: 1.0000 | Freq: Every day | 1 refills | Status: DC

## 2022-08-03 MED ORDER — ROSUVASTATIN CALCIUM 10 MG PO TABS: 10.0000 mg | ORAL_TABLET | Freq: Every day | ORAL | 1 refills | Status: DC

## 2022-08-03 MED ORDER — BASAGLAR KWIKPEN 100 UNIT/ML ~~LOC~~ SOPN: 30.0000 [IU] | PEN_INJECTOR | Freq: Every day | SUBCUTANEOUS | 0 refills | Status: DC

## 2022-08-03 MED ORDER — OLMESARTAN MEDOXOMIL 20 MG PO TABS: 20.0000 mg | ORAL_TABLET | Freq: Every day | ORAL | 0 refills | Status: DC

## 2022-08-03 NOTE — Progress Notes (Signed)
Subjective:  Patient ID: Brandon Ruiz, male    DOB: 16-Feb-1976  Age: 47 y.o. MRN: [id number]  CC: Annual Exam, Hyperlipidemia, Diabetes, and Hypertension   HPI Jameson Rowlette presents for a CPX and to establish ----  He is status post incision and drainage of an abscess in his right thigh.  At the time that he had the abscess his A1c was 12.5%.  He has been working on his lifestyle modification and has been able to lose weight.  He walks about 1.5 miles every other day and has good endurance.  He denies chest pain, shortness of breath, diaphoresis, or edema.  He is aware of heavy snoring.  History Hinton has a past medical history of Diabetes mellitus without complication ([place]).   He has a past surgical history that includes Appendectomy and Incision and drainage of wound (Right, 05/22/2022).   His family history includes Lung cancer in his father.He reports that he has never smoked. He has never used smokeless tobacco. He reports current alcohol use of about 3.0 standard drinks of alcohol per week. He reports that he does not use drugs.  Outpatient Medications Prior to Visit  Medication Sig Dispense Refill   acetaminophen (TYLENOL) 500 MG tablet Take 500-1,500 mg by mouth 2 (two) times daily as needed for fever (for pain).     insulin aspart (NOVOLOG) 100 UNIT/ML FlexPen Inject 5 Units into the skin 3 (three) times daily with meals. 15 mL 1   metFORMIN (GLUCOPHAGE) 1000 MG tablet Take 1 tablet (1,000 mg total) by mouth 2 (two) times daily with a meal. 60 tablet 1   blood glucose meter kit and supplies Dispense based on patient and insurance preference. Use up to four times daily as directed. (FOR ICD-10 E10.9, E11.9). 1 each 0   traMADol (ULTRAM) 50 MG tablet Take 1 tablet (50 mg total) by mouth every 8 (eight) hours as needed for up to 10 doses. 10 tablet 0   Insulin Glargine (BASAGLAR KWIKPEN) 100 UNIT/ML Inject 15 Units into the skin at bedtime. 4.5 mL 0   No facility-administered medications  prior to visit.    ROS Review of Systems  Constitutional: Negative.  Negative for chills, diaphoresis, fatigue, fever and unexpected weight change.  HENT: Negative.    Eyes: Negative.  Negative for visual disturbance.  Respiratory: Negative.  Negative for apnea, cough, shortness of breath and wheezing.   Cardiovascular:  Negative for chest pain, palpitations and leg swelling.  Gastrointestinal:  Negative for abdominal pain, blood in stool, diarrhea, nausea and vomiting.  Genitourinary: Negative.   Musculoskeletal:  Positive for arthralgias. Negative for myalgias.  Skin: Negative.   Neurological: Negative.  Negative for dizziness, weakness and headaches.  Hematological:  Negative for adenopathy. Does not bruise/bleed easily.  Psychiatric/Behavioral: Negative.      Objective:  BP (!) 158/98 (BP Location: Right Arm, Patient Position: Sitting, Cuff Size: Large)   Pulse 100   Temp 98.2 F (36.8 C) (Oral)   Resp 16   Ht 6' 4"$  (1.93 m)   Wt 248 lb (112.5 kg)   SpO2 96%   BMI 30.19 kg/m   Physical Exam Vitals reviewed.  Constitutional:      Appearance: He is not ill-appearing.  HENT:     Mouth/Throat:     Mouth: Mucous membranes are moist.  Eyes:     General: No scleral icterus.    Conjunctiva/sclera: Conjunctivae normal.  Cardiovascular:     Rate and Rhythm: Normal rate and regular rhythm.  Heart sounds: Normal heart sounds, S1 normal and S2 normal. No murmur heard.    No gallop.     Comments: EKG- NSR, 90 bpm RBBB - no old EKG's No LVH or Q waves  Pulmonary:     Effort: Pulmonary effort is normal.     Breath sounds: No stridor. No wheezing, rhonchi or rales.  Abdominal:     General: Abdomen is protuberant. Bowel sounds are normal. There is no distension.     Palpations: Abdomen is soft. There is no hepatomegaly, splenomegaly or mass.     Tenderness: There is no abdominal tenderness. There is no guarding or rebound.  Musculoskeletal:     Cervical back: Neck  supple.     Right lower leg: No edema.     Left lower leg: No edema.  Lymphadenopathy:     Cervical: No cervical adenopathy.  Skin:    General: Skin is warm and dry.  Neurological:     General: No focal deficit present.     Mental Status: He is alert. Mental status is at baseline.  Psychiatric:        Mood and Affect: Mood normal.        Behavior: Behavior normal.     Lab Results  Component Value Date   WBC 10.5 05/23/2022   HGB 13.9 05/23/2022   HCT 41.7 05/23/2022   PLT 270 05/23/2022   GLUCOSE 131 (H) 08/03/2022   CHOL 164 08/03/2022   TRIG 389.0 (H) 08/03/2022   HDL 27.10 (L) 08/03/2022   LDLDIRECT 85.0 08/03/2022   ALT 27 05/22/2022   AST 16 05/22/2022   NA 140 08/03/2022   K 4.2 08/03/2022   CL 104 08/03/2022   CREATININE 0.68 08/03/2022   BUN 12 08/03/2022   CO2 26 08/03/2022   TSH 0.71 08/03/2022   PSA 0.93 08/03/2022   INR 1.1 05/22/2022   HGBA1C 7.7 (H) 08/03/2022   MICROALBUR 9.0 (H) 08/03/2022     Assessment & Plan:   Bayley was seen today for annual exam, hyperlipidemia, diabetes and hypertension.  Diagnoses and all orders for this visit:  Insulin-requiring or dependent type II diabetes mellitus ([place])- His A1c is down to 7.7% but he has not achieved his A1c goal of 6.5-7%.  I have asked him to increase the dose of basal insulin and to discontinue use of the mealtime insulin.  Will continue the current dose of metformin. -     Ambulatory referral to Ophthalmology -     Continuous Blood Gluc Sensor (DEXCOM G7 SENSOR) MISC; 1 Act by Does not apply route daily. -     Continuous Blood Gluc Receiver ([place]) [place]; 1 Act by Does not apply route daily. -     Hemoglobin A1c; Future -     Microalbumin / creatinine urine ratio; Future -     Basic metabolic panel; Future -     Basic metabolic panel -     Microalbumin / creatinine urine ratio -     Hemoglobin A1c -     Insulin Glargine (BASAGLAR KWIKPEN) 100 UNIT/ML; Inject 30 Units into the skin at  bedtime. -     HM Diabetes Foot Exam  Primary hypertension- He has not achieved his blood pressure goal of 130/80.  Will check labs to screen for secondary causes and endorgan damage.  I have asked him to start taking an ARB.  EKG is negative for LVH. -     EKG 12-Lead -  TSH; Future -     Urinalysis, Routine w reflex microscopic; Future -     Basic metabolic panel; Future -     Aldosterone + renin activity w/ ratio; Future -     Aldosterone + renin activity w/ ratio -     Basic metabolic panel -     Urinalysis, Routine w reflex microscopic -     TSH -     olmesartan (BENICAR) 20 MG tablet; Take 1 tablet (20 mg total) by mouth daily.  Dyslipidemia, goal LDL below 100- Will start a statin for cardiovascular risk reduction. -     Lipid panel; Future -     TSH; Future -     TSH -     Lipid panel -     rosuvastatin (CRESTOR) 10 MG tablet; Take 1 tablet (10 mg total) by mouth daily.  Encounter for general adult medical examination with abnormal findings- Exam completed, labs reviewed, vaccines reviewed, cancer screenings addressed, patient education was given. -     Hepatitis C antibody; Future -     PSA; Future -     PSA -     Hepatitis C antibody  Need for hepatitis C screening test -     Hepatitis C antibody; Future -     Hepatitis C antibody  Microalbuminuria due to type 2 diabetes mellitus (HCC) -     olmesartan (BENICAR) 20 MG tablet; Take 1 tablet (20 mg total) by mouth daily.  Loud snoring -     Ambulatory referral to Sleep Studies  Screen for colon cancer -     Cologuard  Other orders -     LDL cholesterol, direct   I have discontinued Ricard Benedetti's blood glucose meter kit and supplies and traMADol. I have also changed his Basaglar KwikPen. Additionally, I am having him start on Dexcom G7 Sensor, Dexcom G7 Receiver, olmesartan, and rosuvastatin. Lastly, I am having him maintain his acetaminophen, metFORMIN, and insulin aspart.  Meds ordered this encounter   Medications   Continuous Blood Gluc Sensor (DEXCOM G7 SENSOR) MISC    Sig: 1 Act by Does not apply route daily.    Dispense:  9 each    Refill:  1   Continuous Blood Gluc Receiver ([place]) DEVI    Sig: 1 Act by Does not apply route daily.    Dispense:  9 each    Refill:  1   Insulin Glargine (BASAGLAR KWIKPEN) 100 UNIT/ML    Sig: Inject 30 Units into the skin at bedtime.    Dispense:  27 mL    Refill:  0   olmesartan (BENICAR) 20 MG tablet    Sig: Take 1 tablet (20 mg total) by mouth daily.    Dispense:  90 tablet    Refill:  0   rosuvastatin (CRESTOR) 10 MG tablet    Sig: Take 1 tablet (10 mg total) by mouth daily.    Dispense:  90 tablet    Refill:  1     Follow-up: Return in about 3 months (around 11/01/2022).  Scarlette Calico, MD

## 2022-08-05 ENCOUNTER — Encounter: Payer: Self-pay | Admitting: Internal Medicine

## 2022-08-06 ENCOUNTER — Other Ambulatory Visit: Payer: Self-pay | Admitting: Internal Medicine

## 2022-08-06 DIAGNOSIS — E119 Type 2 diabetes mellitus without complications: Secondary | ICD-10-CM

## 2022-08-06 MED ORDER — ZEGALOGUE 0.6 MG/0.6ML ~~LOC~~ SOAJ: 1.0000 | Freq: Every day | SUBCUTANEOUS | 5 refills | Status: DC | PRN

## 2022-08-06 NOTE — Addendum Note (Signed)
Addended by: Janith Lima on: 08/06/2022 12:04 PM   Modules accepted: Orders

## 2022-08-08 ENCOUNTER — Other Ambulatory Visit: Payer: Self-pay | Admitting: Internal Medicine

## 2022-08-10 LAB — HEPATITIS C ANTIBODY: Hepatitis C Ab: NONREACTIVE

## 2022-08-10 LAB — ALDOSTERONE + RENIN ACTIVITY W/ RATIO
ALDO / PRA Ratio: 12 Ratio (ref 0.9–28.9)
Aldosterone: 3 ng/dL
Renin Activity: 0.25 ng/mL/h (ref 0.25–5.82)

## 2022-09-01 LAB — COLOGUARD: Cologuard: POSITIVE — AB

## 2022-09-02 LAB — COLOGUARD: COLOGUARD: POSITIVE — AB

## 2022-09-03 ENCOUNTER — Other Ambulatory Visit: Payer: Self-pay | Admitting: Internal Medicine

## 2022-09-03 ENCOUNTER — Encounter: Payer: Self-pay | Admitting: Internal Medicine

## 2022-09-03 DIAGNOSIS — R195 Other fecal abnormalities: Secondary | ICD-10-CM | POA: Insufficient documentation

## 2022-09-03 IMAGING — DX DG TIBIA/FIBULA PORT 2V*R*
1 series · 4 of 4 positions shown · non-contrast
Comparison: None.
COMPARISON: None.

Addendum:
CLINICAL DATA: Right lower leg laceration

EXAM:
PORTABLE RIGHT TIBIA AND FIBULA - 2 VIEW

[Series 1: leg · 0.14mm/px · 4 of 4 slices shown]
[im 1/4]
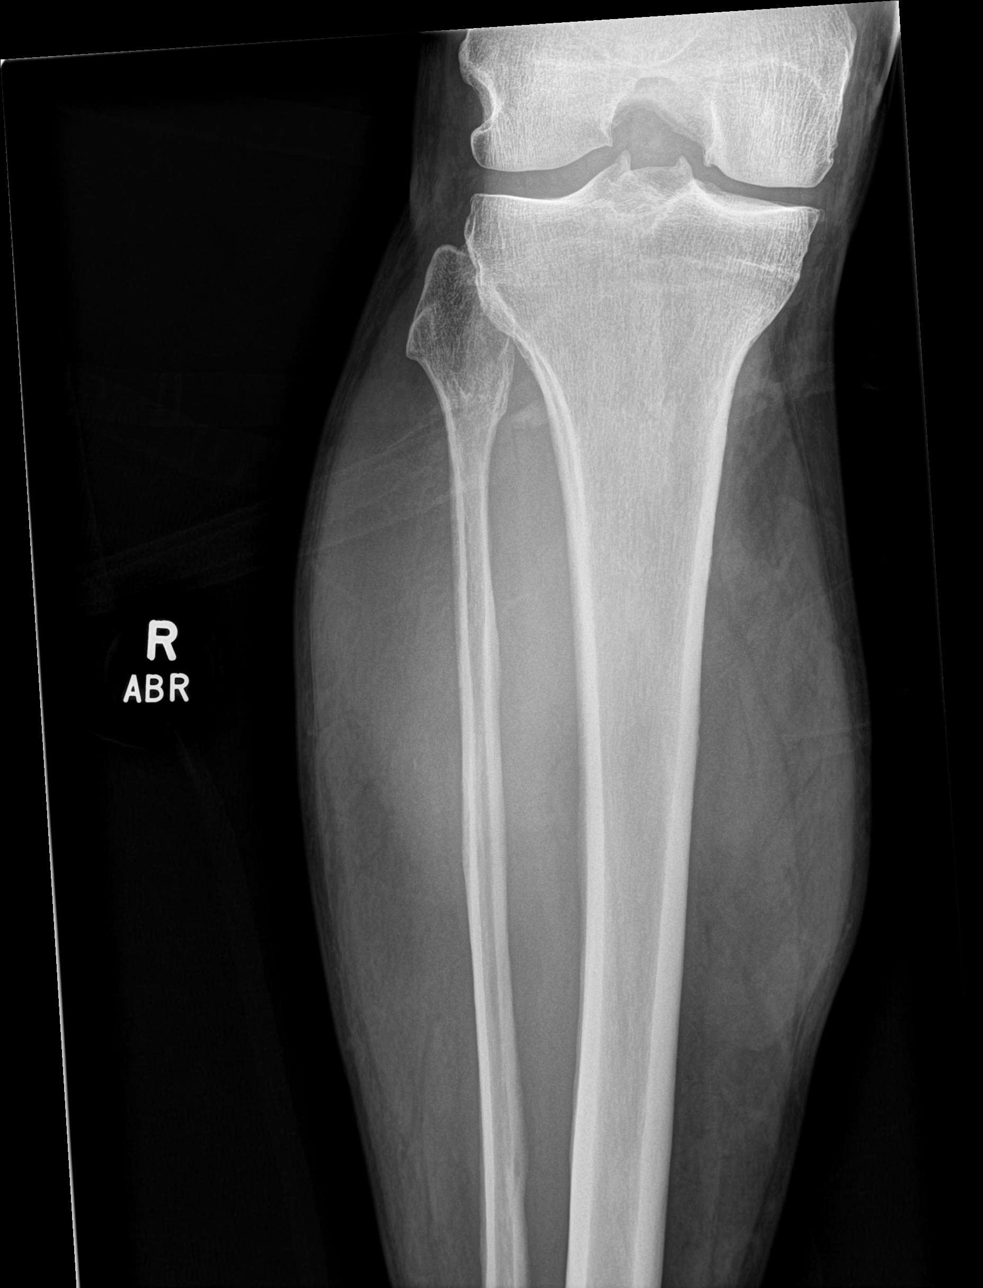
[im 2/4]
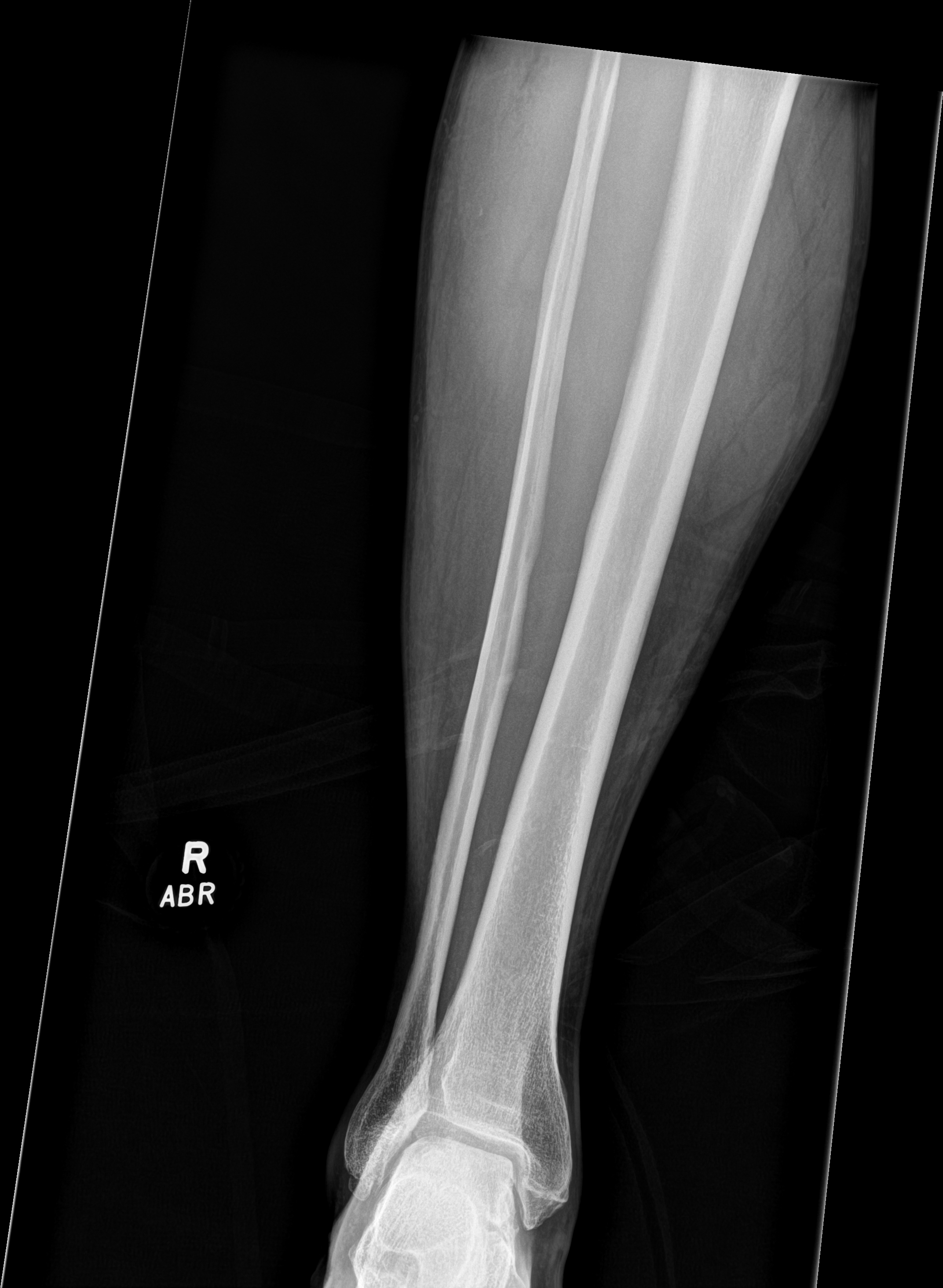
[im 3/4]
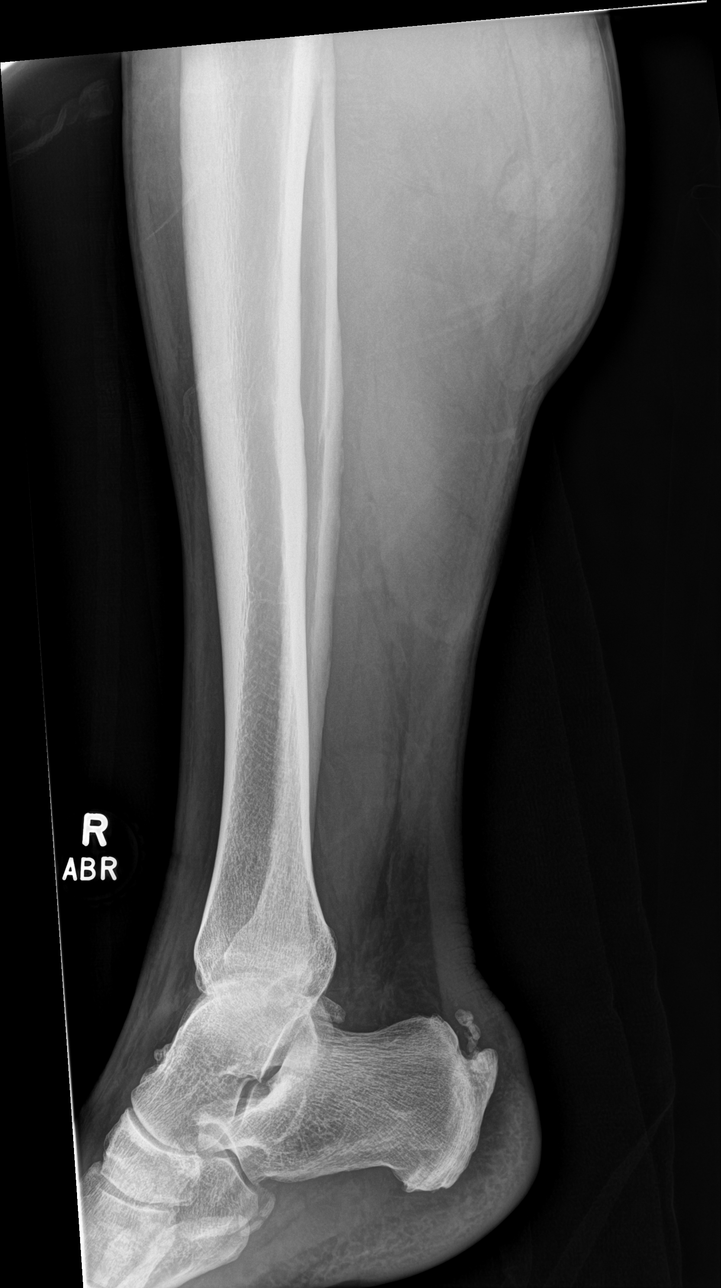
[im 4/4]
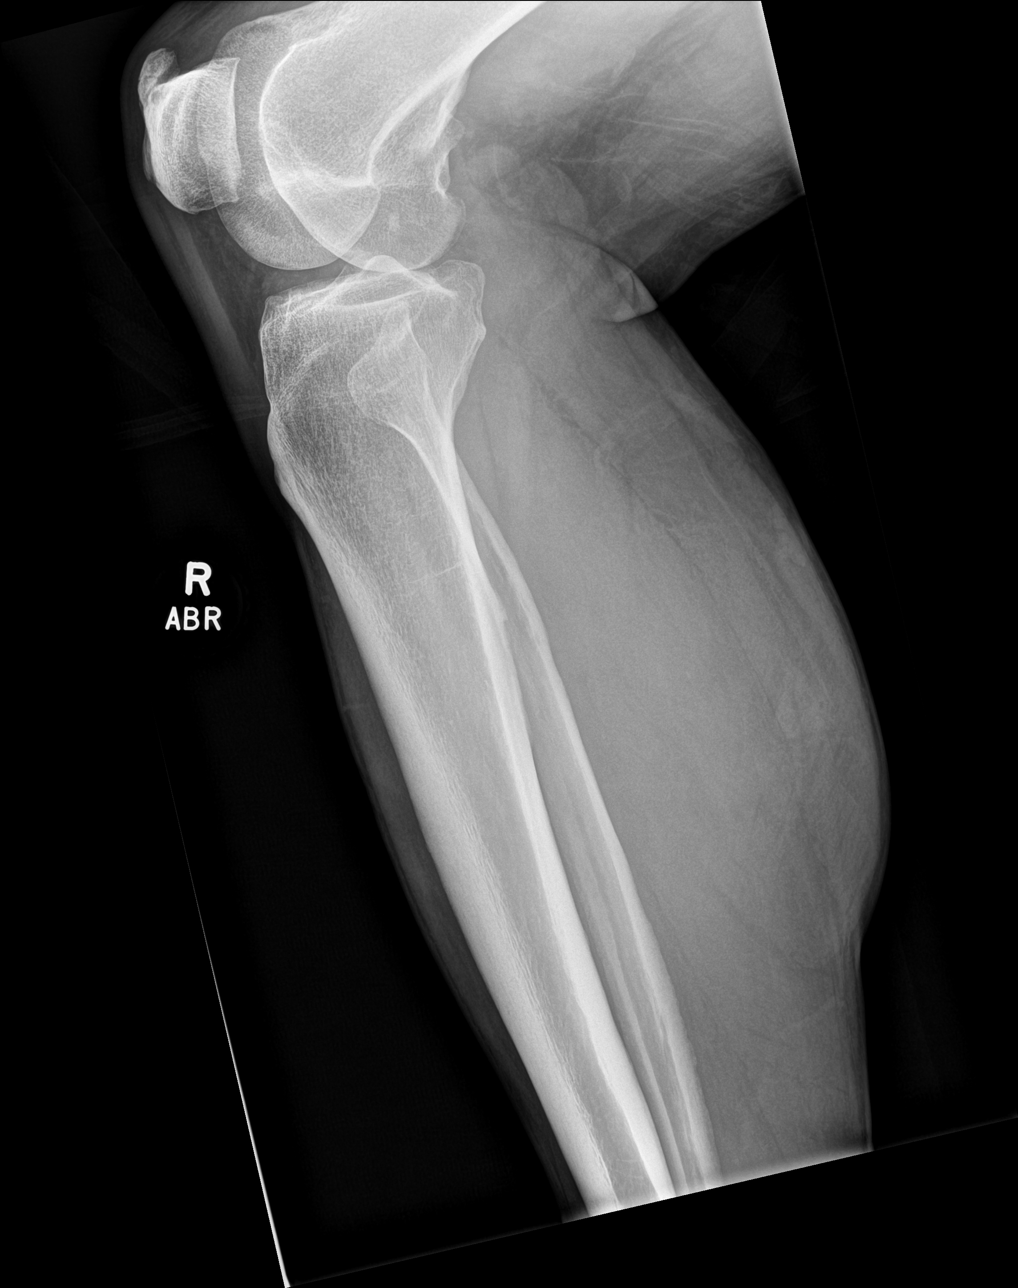

[4 of 4 positions shown; findings below may reference images not displayed]

FINDINGS: There is no evidence of fracture or other focal bone lesions. Soft
tissue density of the lower leg projecting between the radius and
ulna on AP view, likely a vascular calcification. No radiopaque
foreign body.
IMPRESSION: No acute osseous abnormality.

ADDENDUM:
Corrected report: Original report was generated with an error,
corrected as follows: Soft tissue density of the lower leg
projecting between the tibia and fibula on AP view, likely a
vascular calcification.

*** End of Addendum ***
FINDINGS: There is no evidence of fracture or other focal bone lesions. Soft
tissue density of the lower leg projecting between the radius and
ulna on AP view, likely a vascular calcification. No radiopaque
foreign body.
IMPRESSION: No acute osseous abnormality.

## 2022-09-20 ENCOUNTER — Encounter: Payer: Self-pay | Admitting: Internal Medicine

## 2022-09-20 ENCOUNTER — Ambulatory Visit (AMBULATORY_SURGERY_CENTER): Payer: Managed Care, Other (non HMO) | Admitting: *Deleted

## 2022-09-20 VITALS — Ht 76.0 in | Wt 245.0 lb

## 2022-09-20 DIAGNOSIS — Z1211 Encounter for screening for malignant neoplasm of colon: Secondary | ICD-10-CM

## 2022-09-20 MED ORDER — NA SULFATE-K SULFATE-MG SULF 17.5-3.13-1.6 GM/177ML PO SOLN: 1.0000 | Freq: Once | ORAL | 0 refills | Status: AC

## 2022-09-20 NOTE — Progress Notes (Signed)
Pt's previsit is done over the phone and all paperwork (prep instructions) sent to patient. Pt's name and DOB verified at the beginning of the previsit. Pt denies any difficulty with ambulating.  No egg or soy allergy known to patient  No issues known to pt with past sedation with any surgeries or procedures Patient no issues being intubated No FH of Malignant Hyperthermia Pt is not on diet pills Pt is not on  home 02  Pt is not on blood thinners  Pt denies issues with constipation  Pt is not on dialysis Pt denies any upcoming cardiac testing Pt encouraged to use to use Singlecare or Goodrx to reduce cost  Patient's chart reviewed by Osvaldo Angst CNRA prior to previsit and patient appropriate for the [facility].  Previsit completed and red dot placed by patient's name on their procedure day (on provider's schedule).  . Visit by phone Pt states her weight is 245lb Instructions reviewed with pt and pt states understanding. Instructed to review again prior to procedure. Pt states they will.  Instructions sent by mail with coupon and by my chart

## 2022-09-21 ENCOUNTER — Other Ambulatory Visit: Payer: Self-pay | Admitting: Internal Medicine

## 2022-10-19 ENCOUNTER — Other Ambulatory Visit: Payer: Self-pay

## 2022-10-19 ENCOUNTER — Ambulatory Visit: Payer: Managed Care, Other (non HMO) | Admitting: Internal Medicine

## 2022-10-19 ENCOUNTER — Emergency Department (HOSPITAL_COMMUNITY): Payer: Managed Care, Other (non HMO)

## 2022-10-19 ENCOUNTER — Encounter (HOSPITAL_COMMUNITY): Payer: Self-pay

## 2022-10-19 ENCOUNTER — Other Ambulatory Visit: Payer: Managed Care, Other (non HMO)

## 2022-10-19 ENCOUNTER — Telehealth: Payer: Self-pay | Admitting: Internal Medicine

## 2022-10-19 ENCOUNTER — Inpatient Hospital Stay (HOSPITAL_COMMUNITY)
Admission: EM | Admit: 2022-10-19 | Discharge: 2022-10-21 | DRG: 920 | Disposition: A | Discharge status: Home / Self Care | Payer: Managed Care, Other (non HMO) | Attending: Internal Medicine | Admitting: Internal Medicine

## 2022-10-19 ENCOUNTER — Encounter: Payer: Self-pay | Admitting: Internal Medicine

## 2022-10-19 ENCOUNTER — Encounter: Payer: Managed Care, Other (non HMO) | Admitting: Internal Medicine

## 2022-10-19 VITALS — BP 150/86 | HR 100 | Temp 98.4°F | Resp 20 | Ht 76.0 in | Wt 245.0 lb

## 2022-10-19 DIAGNOSIS — D120 Benign neoplasm of cecum: Secondary | ICD-10-CM | POA: Diagnosis not present

## 2022-10-19 DIAGNOSIS — D127 Benign neoplasm of rectosigmoid junction: Secondary | ICD-10-CM | POA: Diagnosis not present

## 2022-10-19 DIAGNOSIS — Z794 Long term (current) use of insulin: Secondary | ICD-10-CM

## 2022-10-19 DIAGNOSIS — Z1211 Encounter for screening for malignant neoplasm of colon: Secondary | ICD-10-CM | POA: Diagnosis present

## 2022-10-19 DIAGNOSIS — Y838 Other surgical procedures as the cause of abnormal reaction of the patient, or of later complication, without mention of misadventure at the time of the procedure: Secondary | ICD-10-CM | POA: Diagnosis present

## 2022-10-19 DIAGNOSIS — K91840 Postprocedural hemorrhage and hematoma of a digestive system organ or structure following a digestive system procedure: Secondary | ICD-10-CM | POA: Diagnosis not present

## 2022-10-19 DIAGNOSIS — Z79899 Other long term (current) drug therapy: Secondary | ICD-10-CM

## 2022-10-19 DIAGNOSIS — R195 Other fecal abnormalities: Secondary | ICD-10-CM

## 2022-10-19 DIAGNOSIS — D124 Benign neoplasm of descending colon: Secondary | ICD-10-CM

## 2022-10-19 DIAGNOSIS — D62 Acute posthemorrhagic anemia: Secondary | ICD-10-CM | POA: Diagnosis present

## 2022-10-19 DIAGNOSIS — I10 Essential (primary) hypertension: Secondary | ICD-10-CM | POA: Diagnosis present

## 2022-10-19 DIAGNOSIS — Z9889 Other specified postprocedural states: Secondary | ICD-10-CM

## 2022-10-19 DIAGNOSIS — K6389 Other specified diseases of intestine: Secondary | ICD-10-CM

## 2022-10-19 DIAGNOSIS — K625 Hemorrhage of anus and rectum: Secondary | ICD-10-CM | POA: Diagnosis not present

## 2022-10-19 DIAGNOSIS — K922 Gastrointestinal hemorrhage, unspecified: Secondary | ICD-10-CM | POA: Diagnosis present

## 2022-10-19 DIAGNOSIS — D122 Benign neoplasm of ascending colon: Secondary | ICD-10-CM

## 2022-10-19 DIAGNOSIS — E785 Hyperlipidemia, unspecified: Secondary | ICD-10-CM | POA: Diagnosis present

## 2022-10-19 DIAGNOSIS — Z86010 Personal history of colonic polyps: Secondary | ICD-10-CM

## 2022-10-19 DIAGNOSIS — K8020 Calculus of gallbladder without cholecystitis without obstruction: Secondary | ICD-10-CM | POA: Diagnosis present

## 2022-10-19 DIAGNOSIS — D123 Benign neoplasm of transverse colon: Secondary | ICD-10-CM | POA: Diagnosis not present

## 2022-10-19 DIAGNOSIS — Z7984 Long term (current) use of oral hypoglycemic drugs: Secondary | ICD-10-CM

## 2022-10-19 DIAGNOSIS — K633 Ulcer of intestine: Secondary | ICD-10-CM | POA: Diagnosis present

## 2022-10-19 DIAGNOSIS — Z801 Family history of malignant neoplasm of trachea, bronchus and lung: Secondary | ICD-10-CM

## 2022-10-19 DIAGNOSIS — E1165 Type 2 diabetes mellitus with hyperglycemia: Secondary | ICD-10-CM | POA: Diagnosis present

## 2022-10-19 DIAGNOSIS — I722 Aneurysm of renal artery: Secondary | ICD-10-CM | POA: Diagnosis present

## 2022-10-19 DIAGNOSIS — R1084 Generalized abdominal pain: Secondary | ICD-10-CM

## 2022-10-19 DIAGNOSIS — Z880 Allergy status to penicillin: Secondary | ICD-10-CM

## 2022-10-19 DIAGNOSIS — K9189 Other postprocedural complications and disorders of digestive system: Secondary | ICD-10-CM

## 2022-10-19 LAB — COMPREHENSIVE METABOLIC PANEL
ALT: 15 U/L (ref 0–44)
AST: 14 U/L — ABNORMAL LOW (ref 15–41)
Albumin: 4.1 g/dL (ref 3.5–5.0)
Alkaline Phosphatase: 70 U/L (ref 38–126)
Anion gap: 10 (ref 5–15)
BUN: 13 mg/dL (ref 6–20)
CO2: 22 mmol/L (ref 22–32)
Calcium: 8.9 mg/dL (ref 8.9–10.3)
Chloride: 105 mmol/L (ref 98–111)
Creatinine, Ser: 0.84 mg/dL (ref 0.61–1.24)
GFR, Estimated: >60 mL/min (ref >60)
Glucose, Bld: 182 mg/dL — ABNORMAL HIGH (ref 70–99)
Potassium: 3.5 mmol/L (ref 3.5–5.1)
Sodium: 137 mmol/L (ref 135–145)
Total Bilirubin: 1.3 mg/dL — ABNORMAL HIGH (ref 0.3–1.2)
Total Protein: 6.7 g/dL (ref 6.5–8.1)

## 2022-10-19 LAB — HEMOGLOBIN AND HEMATOCRIT, BLOOD
HCT: 36.5 % — ABNORMAL LOW (ref 39.0–52.0)
Hemoglobin: 12.4 g/dL — ABNORMAL LOW (ref 13.0–17.0)

## 2022-10-19 LAB — CBC WITH DIFFERENTIAL/PLATELET
Abs Immature Granulocytes: 0.06 10*3/uL (ref 0.00–0.07)
Basophils Absolute: 0.1 10*3/uL (ref 0.0–0.1)
Basophils Relative: 0 %
Eosinophils Absolute: 0 10*3/uL (ref 0.0–0.5)
Eosinophils Relative: 0 %
HCT: 42.7 % (ref 39.0–52.0)
Hemoglobin: 14.2 g/dL (ref 13.0–17.0)
Immature Granulocytes: 0 %
Lymphocytes Relative: 6 %
Lymphs Abs: 1 10*3/uL (ref 0.7–4.0)
MCH: 29.9 pg (ref 26.0–34.0)
MCHC: 33.3 g/dL (ref 30.0–36.0)
MCV: 89.9 fL (ref 80.0–100.0)
Monocytes Absolute: 1 10*3/uL (ref 0.1–1.0)
Monocytes Relative: 6 %
Neutro Abs: 14.1 10*3/uL — ABNORMAL HIGH (ref 1.7–7.7)
Neutrophils Relative %: 88 %
Platelets: 269 10*3/uL (ref 150–400)
RBC: 4.75 MIL/uL (ref 4.22–5.81)
RDW: 13.3 % (ref 11.5–15.5)
WBC: 16.3 10*3/uL — ABNORMAL HIGH (ref 4.0–10.5)
nRBC: 0 % (ref 0.0–0.2)

## 2022-10-19 LAB — HEMOGLOBIN: Hemoglobin: 13.1 g/dL (ref 13.0–17.0)

## 2022-10-19 LAB — LACTIC ACID, PLASMA
Lactic Acid, Venous: 1.8 mmol/L (ref 0.5–1.9)
Lactic Acid, Venous: 1.5 mmol/L (ref 0.5–1.9)

## 2022-10-19 LAB — TYPE AND SCREEN
ABO/RH(D): B POS
Antibody Screen: NEGATIVE

## 2022-10-19 LAB — PROTIME-INR
INR: 1.1 (ref 0.8–1.2)
Prothrombin Time: 13.8 seconds (ref 11.4–15.2)

## 2022-10-19 LAB — APTT: aPTT: 24 seconds (ref 24–36)

## 2022-10-19 LAB — GLUCOSE, CAPILLARY: Glucose-Capillary: 167 mg/dL — ABNORMAL HIGH (ref 70–99)

## 2022-10-19 MED ORDER — IOHEXOL 350 MG/ML SOLN
100.0000 mL | Freq: Once | INTRAVENOUS | Status: AC | PRN
  Administered 2022-10-19: 100 mL via INTRAVENOUS

## 2022-10-19 MED ORDER — LACTATED RINGERS IV SOLN: INTRAVENOUS | Status: DC

## 2022-10-19 MED ORDER — METRONIDAZOLE 500 MG/100ML IV SOLN
500.0000 mg | Freq: Two times a day (BID) | INTRAVENOUS | Status: DC
  Administered 2022-10-19: 500 mg via INTRAVENOUS
  Filled 2022-10-19: qty 100

## 2022-10-19 MED ORDER — HYDROMORPHONE HCL 1 MG/ML IJ SOLN
0.5000 mg | INTRAMUSCULAR | Status: AC
  Administered 2022-10-19: 0.5 mg via INTRAVENOUS
  Filled 2022-10-19: qty 1

## 2022-10-19 MED ORDER — MELATONIN 5 MG PO TABS: 5.0000 mg | ORAL_TABLET | Freq: Every evening | ORAL | Status: DC | PRN

## 2022-10-19 MED ORDER — FENTANYL CITRATE PF 50 MCG/ML IJ SOSY
50.0000 ug | PREFILLED_SYRINGE | Freq: Once | INTRAMUSCULAR | Status: AC
  Administered 2022-10-19: 50 ug via INTRAVENOUS
  Filled 2022-10-19: qty 1

## 2022-10-19 MED ORDER — OXYCODONE HCL 5 MG PO TABS
5.0000 mg | ORAL_TABLET | Freq: Four times a day (QID) | ORAL | Status: DC | PRN
  Administered 2022-10-19: 5 mg via ORAL
  Filled 2022-10-19: qty 1

## 2022-10-19 MED ORDER — CIPROFLOXACIN IN D5W 400 MG/200ML IV SOLN
400.0000 mg | Freq: Once | INTRAVENOUS | Status: AC
  Administered 2022-10-19: 400 mg via INTRAVENOUS
  Filled 2022-10-19: qty 200

## 2022-10-19 MED ORDER — ACETAMINOPHEN 325 MG PO TABS: 650.0000 mg | ORAL_TABLET | Freq: Four times a day (QID) | ORAL | Status: DC | PRN

## 2022-10-19 MED ORDER — LACTATED RINGERS IV SOLN
INTRAVENOUS | Status: DC
  Administered 2022-10-19: 50 mL/h via INTRAVENOUS

## 2022-10-19 MED ORDER — LACTATED RINGERS IV BOLUS
1000.0000 mL | Freq: Once | INTRAVENOUS | Status: AC
  Administered 2022-10-19: 1000 mL via INTRAVENOUS

## 2022-10-19 MED ORDER — CIPROFLOXACIN IN D5W 400 MG/200ML IV SOLN
400.0000 mg | Freq: Two times a day (BID) | INTRAVENOUS | Status: DC
  Administered 2022-10-20 – 2022-10-21 (×3): 400 mg via INTRAVENOUS
  Filled 2022-10-19 (×3): qty 200

## 2022-10-19 MED ORDER — SODIUM CHLORIDE 0.9 % IV SOLN: 500.0000 mL | Freq: Once | INTRAVENOUS | Status: DC

## 2022-10-19 MED ORDER — INSULIN ASPART 100 UNIT/ML IJ SOLN
0.0000 [IU] | INTRAMUSCULAR | Status: DC
  Administered 2022-10-20: 1 [IU] via SUBCUTANEOUS
  Administered 2022-10-20 (×2): 2 [IU] via SUBCUTANEOUS

## 2022-10-19 MED ORDER — METRONIDAZOLE 500 MG/100ML IV SOLN
500.0000 mg | Freq: Two times a day (BID) | INTRAVENOUS | Status: DC
  Administered 2022-10-20 – 2022-10-21 (×3): 500 mg via INTRAVENOUS
  Filled 2022-10-19 (×3): qty 100

## 2022-10-19 MED ORDER — HYDROMORPHONE HCL 1 MG/ML IJ SOLN
0.5000 mg | INTRAMUSCULAR | Status: DC | PRN
  Administered 2022-10-20 (×2): 0.5 mg via INTRAVENOUS
  Filled 2022-10-19 (×3): qty 0.5

## 2022-10-19 MED ORDER — PROCHLORPERAZINE EDISYLATE 10 MG/2ML IJ SOLN: 5.0000 mg | Freq: Four times a day (QID) | INTRAMUSCULAR | Status: DC | PRN

## 2022-10-19 NOTE — ED Triage Notes (Addendum)
Patient had a colonoscopy 3 hours ago. Has had generalized abdominal pain since the procedure along with bright red blood in his stools. Abdomen tender to touch.

## 2022-10-19 NOTE — Op Note (Signed)
[facility] Patient Name: Brandon Ruiz Procedure Date: 10/19/2022 12:23 PM MRN: [id number] Endoscopist: Wilhemina Bonito. Marina Goodell , MD, [id number] Age: 47 Referring MD:  Date of Birth: 1975-12-01 Gender: Male Account #: [id number] Procedure:                Colonoscopy with snare cold (2), hot (4); EMR                            (1);bx; submucosal inj Indications:              Screening for colorectal malignant neoplasm.                            Positive Cologuard testing Medicines:                Monitored Anesthesia Care Procedure:                Pre-Anesthesia Assessment:                           - Prior to the procedure, a History and Physical                            was performed, and patient medications and                            allergies were reviewed. The patient's tolerance of                            previous anesthesia was also reviewed. The risks                            and benefits of the procedure and the sedation                            options and risks were discussed with the patient.                            All questions were answered, and informed consent                            was obtained. Prior Anticoagulants: The patient has                            taken no anticoagulant or antiplatelet agents. ASA                            Grade Assessment: II - A patient with mild systemic                            disease. After reviewing the risks and benefits,                            the patient was deemed in satisfactory condition to  undergo the procedure.                           After obtaining informed consent, the colonoscope                            was passed under direct vision. Throughout the                            procedure, the patient's blood pressure, pulse, and                            oxygen saturations were monitored continuously. The                            Olympus [id number])  Colonoscope was                            introduced through the anus and advanced to the the                            cecum, identified by appendiceal orifice and                            ileocecal valve. The ileocecal valve, appendiceal                            orifice, and rectum were photographed. The quality                            of the bowel preparation was excellent. The                            colonoscopy was performed without difficulty. The                            patient tolerated the procedure well. The bowel                            preparation used was SUPREP via split dose                            instruction. Scope In: 12:32:32 PM Scope Out: 1:14:21 PM Scope Withdrawal Time: 0 hours 36 minutes 21 seconds  Total Procedure Duration: 0 hours 41 minutes 49 seconds  Findings:                 Two polyps were found in the ascending colon. The                            polyps were 3 to 5 mm in size. These polyps were                            removed with a cold snare. Resection and retrieval  were complete.                           Four polyps were found in the recto-sigmoid colon                            (at 20 cm from the anal verge), descending colon,                            transverse colon and cecum. The polyps were 8 to 15                            mm in size. These polyps were removed with a hot                            snare. Resection and retrieval were complete.                           A 15 mm polyp was found in the cecum. The polyp was                            sessile. The polyp was removed with a saline                            injection-lift technique using a hot snare. EMR                            technique. Resection and retrieval were complete.                           An ulcerated non-obstructing large mass was found                            in the sigmoid colon. The mass was partially                             circumferential (involving one-half of the lumen                            circumference). This was biopsied with a cold                            forceps for histology. The mass was located at 30                            cm from the anal verge. It measured approximately 4                            cm. The area was tattooed with carbon black just                            distal (downstream toward the anus) to the lesion.  See photo.                           The exam was otherwise without abnormality on                            direct and retroflexion views. Complications:            No immediate complications. Estimated blood loss:                            None. Estimated Blood Loss:     Estimated blood loss: none. Impression:               - Two 3 to 5 mm polyps in the ascending colon,                            removed with a cold snare. Resected and retrieved.                           - Four 8 to 15 mm polyps at the recto-sigmoid                            colon, in the descending colon, in the transverse                            colon and in the cecum, removed with a hot snare.                            Resected and retrieved.                           - One 15 mm polyp in the cecum, removed using                            injection-lift and a hot snare. EMR technique.                            Resected and retrieved.                           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                            Biopsied. Tattooed.                           - The examination was otherwise normal on direct                            and retroflexion views. Recommendation:           - Repeat colonoscopy in 1 year for surveillance.                           - Patient has a contact number  available for                            emergencies. The signs and symptoms of potential                            delayed  complications were discussed with the                            patient. Return to normal activities tomorrow.                            Written discharge instructions were provided to the                            patient.                           - Resume previous diet.                           - Continue present medications.                           - Await pathology results.                           - Schedule contrast-enhanced CT scan of the chest,                            abdomen and pelvis. "Sigmoid mass, rule out passes"                           - CEA level today                           - Referred to general surgery, "sigmoid colon                            cancer, for resection" Wilhemina Bonito. Marina Goodell, MD 10/19/2022 1:36:16 PM This report has been signed electronically.

## 2022-10-19 NOTE — Telephone Encounter (Signed)
Patient called states he is experiencing nauseating abdominal pain and some bleeding, had a colonoscopy today. Please advise.

## 2022-10-19 NOTE — Telephone Encounter (Signed)
This patient underwent colonoscopy earlier today. Multiple small and large polyps removed with both cold and hot snare techniques. 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  He has bleeding may be secondary to the rectosigmoid mass.  Post polypectomy bleeding consideration. His pain could be retained here.  Alternatively, post polypectomy syndrome.  Doubt perforation, unless delayed, as he looked fine in recovery where he was alert and comfortable.  Agree with proceeding to the hospital for assessment and triage.  I have copied the inpatient GI team on this note.

## 2022-10-19 NOTE — ED Notes (Signed)
Patient transported to CT 

## 2022-10-19 NOTE — Telephone Encounter (Signed)
Got it, patient in the ED, I spoke with the ED physician.

## 2022-10-19 NOTE — Patient Instructions (Addendum)
Resume previous diet Continue present medications Await pathology results  Handouts/information given for polyps CT to be scheduled  Referral to general surgery   YOU HAD AN ENDOSCOPIC PROCEDURE TODAY AT THE [facility]:   Refer to the procedure report that was given to you for any specific questions about what was found during the examination.  If the procedure report does not answer your questions, please call your gastroenterologist to clarify.  If you requested that your care partner not be given the details of your procedure findings, then the procedure report has been included in a sealed envelope for you to review at your convenience later.  YOU SHOULD EXPECT: Some feelings of bloating in the abdomen. Passage of more gas than usual.  Walking can help get rid of the air that was put into your GI tract during the procedure and reduce the bloating. If you had a lower endoscopy (such as a colonoscopy or flexible sigmoidoscopy) you may notice spotting of blood in your stool or on the toilet paper. If you underwent a bowel prep for your procedure, you may not have a normal bowel movement for a few days.  Please Note:  You might notice some irritation and congestion in your nose or some drainage.  This is from the oxygen used during your procedure.  There is no need for concern and it should clear up in a day or so.  SYMPTOMS TO REPORT IMMEDIATELY:  Following lower endoscopy (colonoscopy):  Excessive amounts of blood in the stool  Significant tenderness or worsening of abdominal pains  Swelling of the abdomen that is new, acute  Fever of 100F or higher  For urgent or emergent issues, a gastroenterologist can be reached at any hour by calling [phone]. Do not use MyChart messaging for urgent concerns.    DIET:  We do recommend a small meal at first, but then you may proceed to your regular diet.  Drink plenty of fluids but you should avoid alcoholic beverages for 24  hours.  ACTIVITY:  You should plan to take it easy for the rest of today and you should NOT DRIVE or use heavy machinery until tomorrow (because of the sedation medicines used during the test).    FOLLOW UP: Our staff will call the number listed on your records the next business day following your procedure.  We will call around 7:15- 8:00 am to check on you and address any questions or concerns that you may have regarding the information given to you following your procedure. If we do not reach you, we will leave a message.     If any biopsies were taken you will be contacted by phone or by letter within the next 1-3 weeks.  Please call us at [phone] if you have not heard about the biopsies in 3 weeks.    SIGNATURES/CONFIDENTIALITY: You and/or your care partner have signed paperwork which will be entered into your electronic medical record.  These signatures attest to the fact that that the information above on your After Visit Summary has been reviewed and is understood.  Full responsibility of the confidentiality of this discharge information lies with you and/or your care-partner.

## 2022-10-19 NOTE — ED Provider Notes (Signed)
[place] EMERGENCY DEPARTMENT AT [facility] Provider Note   CSN: [id number] Arrival date & time: 10/19/22  1658     History  Chief Complaint  Patient presents with   Rectal Bleeding    Brandon Ruiz is a 47 y.o. male with HTN, T2DM on insulin, +cologuard test who presents with severe generalized abdominal pain and rectal bleeding that started about 45 minutes after his colonoscopy earlier today. Has had 4 episodes of large volume BRBPR, filling the toilet bowl. Doesn't feel lightheaded, no CP/SOB. No urinary symptoms, testicular/scrotal pain. Had several polyps removed and was told he had a spot that looked like colon cancer, was going to have o/p CT abd pelvis next week.   Rectal Bleeding      Home Medications Prior to Admission medications   Medication Sig Start Date End Date Taking? Authorizing Provider  acetaminophen (TYLENOL) 500 MG tablet Take 500-1,500 mg by mouth 2 (two) times daily as needed for fever (for pain). Patient not taking: Reported on 09/20/2022    [provider]  Continuous Blood Gluc Receiver (DEXCOM G7 RECEIVER) DEVI 1 Act by Does not apply route daily. Patient not taking: Reported on 09/20/2022 08/03/22   Etta Grandchild, MD  Continuous Blood Gluc Sensor (DEXCOM G7 SENSOR) MISC 1 Act by Does not apply route daily. Patient not taking: Reported on 09/20/2022 08/03/22   Etta Grandchild, MD  Dasiglucagon HCl (ZEGALOGUE) 0.6 MG/0.6ML SOAJ Inject 1 Act into the skin daily as needed. 08/06/22   Etta Grandchild, MD  Insulin Glargine [facility] KWIKPEN) 100 UNIT/ML Inject 30 Units into the skin at bedtime. 08/03/22 10/19/22  Etta Grandchild, MD  insulin lispro (HUMALOG) 100 UNIT/ML injection Inject 100 Units into the skin as directed.    [provider]  metFORMIN (GLUCOPHAGE) 1000 MG tablet TAKE ONE TABLET BY MOUTH WITH A MEAL TWICE DAILY 08/08/22   Etta Grandchild, MD  olmesartan (BENICAR) 20 MG tablet Take 1 tablet (20 mg total) by mouth daily.  08/03/22   Etta Grandchild, MD  rosuvastatin (CRESTOR) 10 MG tablet Take 1 tablet (10 mg total) by mouth daily. 08/03/22   Etta Grandchild, MD      Allergies    Penicillins    Review of Systems   Review of Systems  Gastrointestinal:  Positive for hematochezia.   Review of systems Negative for lightheadedness, LOC.  A 10 point review of systems was performed and is negative unless otherwise reported in HPI.  Physical Exam Updated Vital Signs BP 127/80 (BP Location: Left Arm)   Pulse (!) 113   Temp 97.7 F (36.5 C) (Oral)   Resp 19   Ht 6\' 4"  (1.93 m)   Wt 111.1 kg   SpO2 100%   BMI 29.82 kg/m  Physical Exam General: Normal appearing male, lying in bed.  HEENT: Sclera anicteric, MMM, trachea midline.  Cardiology: Regular tachycardic rate, no murmurs/rubs/gallops. BL radial and DP pulses equal bilaterally.  Resp: Normal respiratory rate and effort. CTAB, no wheezes, rhonchi, crackles.  Abd: Diffuse abdominal TTP with involuntary guarding. Soft, non-distended. GU: Deferred. MSK: No peripheral edema or signs of trauma. Extremities without deformity or TTP. No cyanosis or clubbing. Skin: warm, dry.  Neuro: A&Ox4, CNs II-XII grossly intact. MAEs. Sensation grossly intact.  Psych: Normal mood and affect.   ED Results / Procedures / Treatments   Labs (all labs ordered are listed, but only abnormal results are displayed) Labs Reviewed  CBC WITH DIFFERENTIAL/PLATELET - Abnormal;  Notable for the following components:      Result Value   WBC 16.3 (*)    Neutro Abs 14.1 (*)    All other components within normal limits  COMPREHENSIVE METABOLIC PANEL - Abnormal; Notable for the following components:   Glucose, Bld 182 (*)    AST 14 (*)    Total Bilirubin 1.3 (*)    All other components within normal limits  LACTIC ACID, PLASMA  LACTIC ACID, PLASMA  PROTIME-INR  APTT  HEMOGLOBIN  TYPE AND SCREEN  ABO/RH    EKG None  Radiology CT ANGIO GI BLEED  Result Date:  10/19/2022 CLINICAL DATA:  Rectal bleeding after colonoscopy, known sigmoid mass, multiple polyps resected EXAM: CTA ABDOMEN AND PELVIS WITHOUT AND WITH CONTRAST TECHNIQUE: Multidetector CT imaging of the abdomen and pelvis was performed using the standard protocol during bolus administration of intravenous contrast. Multiplanar reconstructed images and MIPs were obtained and reviewed to evaluate the vascular anatomy. RADIATION DOSE REDUCTION: This exam was performed according to the departmental dose-optimization program which includes automated exposure control, adjustment of the mA and/or kV according to patient size and/or use of iterative reconstruction technique. CONTRAST:  OMNIPAQUE IOHEXOL 350 MG/ML SOLN COMPARISON:  None Available. FINDINGS: VASCULAR Aorta: Normal caliber aorta without aneurysm, dissection, vasculitis or significant stenosis. Celiac: Patent without evidence of aneurysm, dissection, vasculitis or significant stenosis. 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 Maximal diameter of the mid SMA measures 1.4 cm reference image 107/6. The dissection extends approximately 4.5 cm in length. Distal branches of the SMA remain patent. No significant stenosis or vasculitis. 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 Reference image 105/6. Otherwise the bilateral renal arteries are patent without evidence of dissection, vasculitis, fibromuscular dysplasia, or significant stenosis. IMA: Patent without evidence of aneurysm, dissection, vasculitis or significant stenosis. Inflow: Patent without evidence of aneurysm, dissection, vasculitis or significant stenosis. Proximal Outflow: Bilateral common femoral and visualized portions of the superficial and profunda femoral arteries are patent without evidence of aneurysm, dissection, vasculitis or  significant stenosis. Veins: No obvious venous abnormality within the limitations of this arterial phase study. Review of the MIP images confirms the above findings. NON-VASCULAR Lower chest: Hypoventilatory changes at the lung bases. Hepatobiliary: Calcified gallstones without cholecystitis. The liver is unremarkable. Pancreas: Unremarkable. No pancreatic ductal dilatation or surrounding inflammatory changes. Spleen: Normal in size without focal abnormality. Adrenals/Urinary Tract: Adrenal glands are unremarkable. Kidneys are normal, without renal calculi, focal lesion, or hydronephrosis. Bladder is unremarkable. Stomach/Bowel: No bowel obstruction or ileus. Annular mass is seen within the mid sigmoid colon, reference image 85/18, compatible with malignancy. There is a small amount of intramural gas within the sigmoid colon just distal to the annular mass, likely sequela of recent biopsy. Additionally, there is mural thickening within the medial aspect of the cecum, reference image 74/18, with punctate foci of intramural gas. By report, the patient had a polypectomy in this region during colonoscopy, this likely reflects postprocedural change. There is no evidence of frank bowel perforation. Free fluid within the bilateral lower quadrants is low in attenuation, and less dense than the bowel contents, consistent with reactive fluid. There is no evidence of intraluminal contrast accumulation to suggest active gastrointestinal hemorrhage. Lymphatic: No pathologic adenopathy within the abdomen or pelvis. Reproductive: Prostate is unremarkable. Other: Trace free fluid within the bilateral lower quadrants. No  free intraperitoneal gas. No abdominal wall hernia. Musculoskeletal: No acute or destructive bony lesions. Degenerative changes of the bilateral hips. Reconstructed images demonstrate no additional findings. IMPRESSION: VASCULAR 1. No evidence of active gastrointestinal bleeding. 2. 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 3. 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 No evidence of rupture. NON-VASCULAR 1. 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 Adjacent intramural gas likely reflects sequela of biopsy during recent colonoscopy. No evidence of perforation. 2. Wall thickening of the medial aspect of the cecum, with punctate foci of intramural gas, again consistent with sequela of biopsy during recent colonoscopy. No evidence of perforation. 3. Small amount of reactive free fluid within the lower quadrants of the abdomen, right greater than left. 4. Cholelithiasis without cholecystitis. Electronically Signed   By: Sharlet Salina M.D.   On: 10/19/2022 19:48    Procedures Procedures    Medications Ordered in ED Medications  ciprofloxacin (CIPRO) IVPB 400 mg (400 mg Intravenous New Bag/Given 10/19/22 2025)  metroNIDAZOLE (FLAGYL) IVPB 500 mg (has no administration in time range)  HYDROmorphone (DILAUDID) injection 0.5 mg (has no administration in time range)  oxyCODONE (Oxy IR/ROXICODONE) immediate release tablet 5 mg (has no administration in time range)  HYDROmorphone (DILAUDID) injection 0.5 mg (has no administration in time range)  prochlorperazine (COMPAZINE) injection 5 mg (has no administration in time range)  lactated ringers bolus 1,000 mL (1,000 mLs Intravenous New Bag/Given 10/19/22 1803)  fentaNYL (SUBLIMAZE) injection 50 mcg (50 mcg Intravenous Given 10/19/22 1804)  iohexol (OMNIPAQUE) 350 MG/ML injection 100 mL (100 mLs Intravenous Contrast Given 10/19/22 1901)    ED Course/ Medical Decision Making/ A&P                          Medical Decision Making Amount and/or Complexity of Data Reviewed Labs: ordered. Decision-making details documented in ED Course. Radiology:  ordered.  Risk Prescription drug management. Decision regarding hospitalization.    This patient presents to the ED for concern of abd pain, hematochezia after colonoscopy; this involves an extensive number of treatment options, and is a complaint that carries with it a high risk of complications and morbidity.  I considered the following differential and admission for this acute, potentially life threatening condition.   MDM:    Per [facility] note Dr. Marina Goodell from earlier today: - Two 3 to 5 mm polyps in the ascending colon,                            removed with a cold snare. Resected and retrieved.                           - Four 8 to 15 mm polyps at the recto-sigmoid                            colon, in the descending colon, in the transverse                            colon and in the cecum, removed with a hot snare.  Resected and retrieved.                           - One 15 mm polyp in the cecum, removed using                            injection-lift and a hot snare. EMR technique.                            Resected and retrieved.                           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                            Biopsied. Tattooed.                           - The examination was otherwise normal on direct                            and retroflexion views. Recommendation:           - Repeat colonoscopy in 1 year for surveillance.                  - Resume previous diet.                           - Continue present medications.                           - Await pathology results.                           - Schedule contrast-enhanced CT scan of the chest,                            abdomen and pelvis. "Sigmoid mass, rule out passes"                           - CEA level today                           - Referred to general surgery, "sigmoid colon                            cancer, for resection"   DDx for LGIB includes but is not  limited to: In s/o colonoscopy today, consider possible bleeding from polypectomy site, bleeding from suspected sigmoid malignancy, or bowel perforation given significant abd TTP and guarding on exam.  Other causes of acute onset abd pain/hematochezia less likely including diverticulitis/diverticulosis, ischemic colitis, AVMs or Dieulafoy lesions, infectious colitis.    Patient is tachy into 120s, pressure is stable. Patient notes his HR typically is around 105 bpm at baseline. Started 2 LBIVs and is on the monitor.  Will initiate fluid resuscitate will initiate fluid resuscitation with 1 L LR and assess for acute blood loss anemia  with possible need for blood.  Type and screen and coags ordered.  Tachycardia improving with fluids. Hgb stable but will trend as may drop his Hgb as it catches up or if he continues to have bloody bowel movements.  Clinical Course as of 10/19/22 2036  Thu Oct 19, 2022  1751 Messaged Dr. Adela Lank with [facility]. [HN]  1819 WBC(!): 16.3 [HN]  1819 Hemoglobin: 14.2 [HN]  1909 Lactic Acid, Venous: 1.8 [HN]  1909 Comprehensive metabolic panel(!) Unremarkable [HN]  1928 D/w Dr. Adela Lank who reviewed the CT scan with radiology. No clear bowel perforation. Dr. Adela Lank queries maybe post-polypectomy syndrome of the cecum. Recommends IV abx and keeping NPO.  [HN]    Clinical Course User Index [HN] Loetta Rough, MD    Labs: I Ordered, and personally interpreted labs.  The pertinent results include:  those listed above  Imaging Studies ordered: I ordered imaging studies including CT abd pelvis angio GIB I independently visualized and interpreted imaging. I agree with the radiologist interpretation  Additional history obtained from chart review, girlfriend at bedside.    Cardiac Monitoring: The patient was maintained on a cardiac monitor.  I personally viewed and interpreted the cardiac monitored which showed an underlying rhythm of: sinus  tachycardia  Reevaluation: After the interventions noted above, I reevaluated the patient and found that they have :improved  Social Determinants of Health: Patient lives independently   Disposition:  Giving IV cipro/flagyl given penicillin allergy. Admitted to hospitalist with Gi following.  Co morbidities that complicate the patient evaluation  Past Medical History:  Diagnosis Date   Diabetes mellitus without complication (HCC)    Hyperlipidemia    Hypertension      Medicines Meds ordered this encounter  Medications   lactated ringers bolus 1,000 mL   fentaNYL (SUBLIMAZE) injection 50 mcg   iohexol (OMNIPAQUE) 350 MG/ML injection 100 mL   ciprofloxacin (CIPRO) IVPB 400 mg    Order Specific Question:   Antibiotic Indication:    Answer:   Intra-abdominal Infection   metroNIDAZOLE (FLAGYL) IVPB 500 mg    Order Specific Question:   Antibiotic Indication:    Answer:   Intra-abdominal Infection   HYDROmorphone (DILAUDID) injection 0.5 mg   oxyCODONE (Oxy IR/ROXICODONE) immediate release tablet 5 mg   HYDROmorphone (DILAUDID) injection 0.5 mg   prochlorperazine (COMPAZINE) injection 5 mg    I have reviewed the patients home medicines and have made adjustments as needed  Problem List / ED Course: Problem List Items Addressed This Visit       Digestive   * (Principal) Rectal bleeding - Primary     Other   Generalized abdominal pain                This note was created using dictation software, which may contain spelling or grammatical errors.    Loetta Rough, MD 10/19/22 [id number]

## 2022-10-19 NOTE — Telephone Encounter (Signed)
Spoke with pt. He c/o increasing abdominal pain. States when he got home it felt like gas pains but the pain has changed in feeling and worsened. States "it feels like someone is punching me in the stomach." Pt also reports that his abdomen is tender to touch. Pt states that since the procedure he has had 3 bowel movements that have been large amounts of bright red bloody water. MD made aware and wants pt to go to ED, [facility] if possible, to be evaluated. Informed pt of MD recommendations. Pt verbalized understanding and will be going to ED for evaluation.

## 2022-10-19 NOTE — Progress Notes (Signed)
Pt's states no medical or surgical changes since previsit or office visit. VS assessed by D.T 

## 2022-10-19 NOTE — Progress Notes (Signed)
HISTORY OF PRESENT ILLNESS:  Brandon Ruiz is a 47 y.o. male who is sent today for colonoscopy after positive Cologuard testing x 2.  No complaints  REVIEW OF SYSTEMS:  All non-GI ROS negative except for  Past Medical History:  Diagnosis Date   Diabetes mellitus without complication    Hyperlipidemia    Hypertension     Past Surgical History:  Procedure Laterality Date   APPENDECTOMY     INCISION AND DRAINAGE OF WOUND Right 05/22/2022   Procedure: IRRIGATION AND DEBRIDEMENT RIGHT GROIN;  Surgeon: Kinsinger, De Blanch, MD;  Location: [facility];  Service: General;  Laterality: Right;    Social History Brandon Ruiz  reports that he has never smoked. He has never used smokeless tobacco. He reports current alcohol use of about 3.0 standard drinks of alcohol per week. He reports that he does not use drugs.  family history includes Lung cancer in his father.  Allergies  Allergen Reactions   Penicillins Itching       PHYSICAL EXAMINATION: Vital signs: BP 135/71   Pulse 97   Temp 98.4 F (36.9 C) (Skin)   Ht  (1.93 m)   Wt 245 lb (111.1 kg)   SpO2 96%   BMI 29.82 kg/m  General: Well-developed, well-nourished, no acute distress HEENT: Sclerae are anicteric, conjunctiva pink. Oral mucosa intact Lungs: Clear Heart: Regular Abdomen: soft, nontender, nondistended, no obvious ascites, no peritoneal signs, normal bowel sounds. No organomegaly. Extremities: No edema Psychiatric: alert and oriented x3. Cooperative     ASSESSMJourden GilsonPositive Cologuard testing  PLAN:   Colonoscopy

## 2022-10-19 NOTE — H&P (Addendum)
History and Physical  Brandon Ruiz OZD:[id number] DOB: [birth year] DOA: 10/19/2022  Referring physician: Dr. Jearld Fenton, EDP  PCP: Etta Grandchild, MD  Outpatient Specialists: [facility]. Patient coming from: Home.  Chief Complaint: Rectal bleeding  HPI: Brandon Ruiz is a 47 y.o. male with medical history significant for hypertension, type 2 diabetes, hyperlipidemia, who presented to [facility] ED from home after noticing rectal bleeding, post polypectomy and sigmoid mass biopsies today.  Associated with lower abdominal pain.  The patient was initially sent today to [facility] for colonoscopy after positive Cologuard testing x 2.  Generally he had no complaints.  Never smoker, 3.0 standard drinks of alcohol per week.  Family history of lung cancer in his father.    Per colonoscopy report, 2 polyps were found in the ascending colon, this was removed with a cold snare.  More polyps were found in the colon and were also removed.  There was an ulcerated nonobstructing large mass found in the sigmoid colon.  The mass was partially circumferential involving one half of the lumen circumference, this was biopsied with a cold forcep for histology.  The mass was located at 30 cm from the anal verge.  It measured approximately 4 cm.  The area was tattooed with carbon black just distal to the lesion.  GI recommendation was to schedule contrast enhanced CT scan of the chest, abdomen and pelvis, to obtain CEA level, and to refer to general surgery, due to concern for sigmoid colon cancer, for possible resection.  After his colonoscopy, he endorses constant lower abdominal pain and rectal bleeding.  He had 4 episodes of large-volume bright red blood per rectum.  He presented to the ED for further evaluation.  In the ED had another 3 bloody stools.  EDP discussed the case with GI who recommended IV antibiotics, admission for observation, and will continue to see in consultation.  IV ciprofloxacin and IV Flagyl were initiated.  The  patient is allergic to penicillin.  The patient was admitted by [facility], hospitalist service, as observation status to [facility] progressive care unit.  ED Course: Tmax 98.2.  BP 133/79, pulse 111, respiration rate 18, saturation 96% on room air.  Lab studies remarkable for serum glucose 182, T. bili 1.3.  WBC 16.3.  Neutrophil count 14.1.  WBC 16.3.  Neutrophil count 14.1.  Review of Systems: Review of systems as noted in the HPI. All other systems reviewed and are negative.   Past Medical History:  Diagnosis Date   Diabetes mellitus without complication (HCC)    Hyperlipidemia    Hypertension    Past Surgical History:  Procedure Laterality Date   APPENDECTOMY     INCISION AND DRAINAGE OF WOUND Right 05/22/2022   Procedure: IRRIGATION AND DEBRIDEMENT RIGHT GROIN;  Surgeon: Kinsinger, De Blanch, MD;  Location: [facility] ORS;  Service: General;  Laterality: Right;    Social History:  reports that he has never smoked. He has never used smokeless tobacco. He reports current alcohol use of about 3.0 standard drinks of alcohol per week. He reports that he does not use drugs.   Allergies  Allergen Reactions   Penicillins Itching    Family History  Problem Relation Age of Onset   Lung cancer Father    Colon polyps Neg Hx    Colon cancer Neg Hx    Esophageal cancer Neg Hx    Stomach cancer Neg Hx    Rectal cancer Neg Hx       Prior to Admission  medications   Medication Sig Start Date End Date Taking? Authorizing Provider  acetaminophen (TYLENOL) 500 MG tablet Take 500-1,500 mg by mouth 2 (two) times daily as needed for fever (for pain). Patient not taking: Reported on 09/20/2022    [provider]  Continuous Blood Gluc Receiver (DEXCOM G7 RECEIVER) DEVI 1 Act by Does not apply route daily. Patient not taking: Reported on 09/20/2022 08/03/22   Etta Grandchild, MD  Continuous Blood Gluc Sensor (DEXCOM G7 SENSOR) MISC 1 Act by Does not apply route daily. Patient not  taking: Reported on 09/20/2022 08/03/22   Etta Grandchild, MD  Dasiglucagon HCl (ZEGALOGUE) 0.6 MG/0.6ML SOAJ Inject 1 Act into the skin daily as needed. 08/06/22   Etta Grandchild, MD  Insulin Glargine [facility] KWIKPEN) 100 UNIT/ML Inject 30 Units into the skin at bedtime. 08/03/22 10/19/22  Etta Grandchild, MD  insulin lispro (HUMALOG) 100 UNIT/ML injection Inject 100 Units into the skin as directed.    [provider]  metFORMIN (GLUCOPHAGE) 1000 MG tablet TAKE ONE TABLET BY MOUTH WITH A MEAL TWICE DAILY 08/08/22   Etta Grandchild, MD  olmesartan (BENICAR) 20 MG tablet Take 1 tablet (20 mg total) by mouth daily. 08/03/22   Etta Grandchild, MD  rosuvastatin (CRESTOR) 10 MG tablet Take 1 tablet (10 mg total) by mouth daily. 08/03/22   Etta Grandchild, MD    Physical Exam: BP 133/79 (BP Location: Left Arm)   Pulse (!) 109   Temp 98.2 F (36.8 C) (Oral)   Resp 18   Ht  (1.93 m)   Wt 111.1 kg   SpO2 96%   BMI 29.82 kg/m   General: 47 y.o. year-old male well developed well nourished in no acute distress.  Alert and oriented x3. Cardiovascular: Regular rate and rhythm with no rubs or gallops.  No thyromegaly or JVD noted.  No lower extremity edema. 2/4 pulses in all 4 extremities. Respiratory: Clear to auscultation with no wheezes or rales. Good inspiratory effort. Abdomen: Soft lower abdominal tenderness with mild palpation.  Nondistended with normal bowel sounds x4 quadrants. Muskuloskeletal: No cyanosis, clubbing or edema noted bilaterally Neuro: CN II-XII intact, strength, sensation, reflexes Skin: No ulcerative lesions noted or rashes Psychiatry: Judgement and insight appear normal. Mood is appropriate for condition and setting          Labs on Admission:  Basic Metabolic Panel: Recent Labs  Lab 10/19/22 1739  NA 137  K 3.5  CL 105  CO2 22  GLUCOSE 182*  BUN 13  CREATININE 0.84  CALCIUM 8.9   Liver Function Tests: Recent Labs  Lab 10/19/22 1739  AST 14*  ALT 15   ALKPHOS 70  BILITOT 1.3*  PROT 6.7  ALBUMIN 4.1   No results for input(s): "LIPASE", "AMYLASE" in the last 168 hours. No results for input(s): "AMMONIA" in the last 168 hours. CBC: Recent Labs  Lab 10/19/22 1739 10/19/22 2045  WBC 16.3*  --   NEUTROABS 14.1*  --   HGB 14.2 13.1  HCT 42.7  --   MCV 89.9  --   PLT 269  --    Cardiac Enzymes: No results for input(s): "CKTOTAL", "CKMB", "CKMBINDEX", "TROPONINI" in the last 168 hours.  BNP (last 3 results) No results for input(s): "BNP" in the last 8760 hours.  ProBNP (last 3 results) No results for input(s): "PROBNP" in the last 8760 hours.  CBG: Recent Labs  Lab 10/19/22 2151  GLUCAP 167*  Radiological Exams on Admission: CT ANGIO GI BLEED  Result Date: 10/19/2022 CLINICAL DATA:  Rectal bleeding after colonoscopy, known sigmoid mass, multiple polyps resected EXAM: CTA ABDOMEN AND PELVIS WITHOUT AND WITH CONTRAST TECHNIQUE: Multidetector CT imaging of the abdomen and pelvis was performed using the standard protocol during bolus administration of intravenous contrast. Multiplanar reconstructed images and MIPs were obtained and reviewed to evaluate the vascular anatomy. RADIATION DOSE REDUCTION: This exam was performed according to the departmental dose-optimization program which includes automated exposure control, adjustment of the mA and/or kV according to patient size and/or use of iterative reconstruction technique. CONTRAST:  OMNIPAQUE IOHEXOL 350 MG/ML SOLN COMPARISON:  None Available. FINDINGS: VASCULAR Aorta: Normal caliber aorta without aneurysm, dissection, vasculitis or significant stenosis. Celiac: Patent without evidence of aneurysm, dissection, vasculitis or significant stenosis. 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 Maximal diameter of the mid SMA measures 1.4 cm reference image 107/6. The dissection extends approximately 4.5 cm in length. Distal  branches of the SMA remain patent. No significant stenosis or vasculitis. 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 Reference image 105/6. Otherwise the bilateral renal arteries are patent without evidence of dissection, vasculitis, fibromuscular dysplasia, or significant stenosis. IMA: Patent without evidence of aneurysm, dissection, vasculitis or significant stenosis. Inflow: Patent without evidence of aneurysm, dissection, vasculitis or significant stenosis. Proximal Outflow: Bilateral common femoral and visualized portions of the superficial and profunda femoral arteries are patent without evidence of aneurysm, dissection, vasculitis or significant stenosis. Veins: No obvious venous abnormality within the limitations of this arterial phase study. Review of the MIP images confirms the above findings. NON-VASCULAR Lower chest: Hypoventilatory changes at the lung bases. Hepatobiliary: Calcified gallstones without cholecystitis. The liver is unremarkable. Pancreas: Unremarkable. No pancreatic ductal dilatation or surrounding inflammatory changes. Spleen: Normal in size without focal abnormality. Adrenals/Urinary Tract: Adrenal glands are unremarkable. Kidneys are normal, without renal calculi, focal lesion, or hydronephrosis. Bladder is unremarkable. Stomach/Bowel: No bowel obstruction or ileus. Annular mass is seen within the mid sigmoid colon, reference image 85/18, compatible with malignancy. There is a small amount of intramural gas within the sigmoid colon just distal to the annular mass, likely sequela of recent biopsy. Additionally, there is mural thickening within the medial aspect of the cecum, reference image 74/18, with punctate foci of intramural gas. By report, the patient had a polypectomy in this region during colonoscopy, this likely reflects postprocedural change. There is no evidence of frank  bowel perforation. Free fluid within the bilateral lower quadrants is low in attenuation, and less dense than the bowel contents, consistent with reactive fluid. There is no evidence of intraluminal contrast accumulation to suggest active gastrointestinal hemorrhage. Lymphatic: No pathologic adenopathy within the abdomen or pelvis. Reproductive: Prostate is unremarkable. Other: Trace free fluid within the bilateral lower quadrants. No free intraperitoneal gas. No abdominal wall hernia. Musculoskeletal: No acute or destructive bony lesions. Degenerative changes of the bilateral hips. Reconstructed images demonstrate no additional findings. IMPRESSION: VASCULAR 1. No evidence of active gastrointestinal bleeding. 2. 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 3. 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 No evidence of rupture. NON-VASCULAR 1. Annular mass of the mid sigmoid colon consistent  with malignancy. Adjacent intramural gas likely reflects sequela of biopsy during recent colonoscopy. No evidence of perforation. 2. Wall thickening of the medial aspect of the cecum, with punctate foci of intramural gas, again consistent with sequela of biopsy during recent colonoscopy. No evidence of perforation. 3. Small amount of reactive free fluid within the lower quadrants of the abdomen, right greater than left. 4. Cholelithiasis without cholecystitis. Electronically Signed   By: Sharlet Salina M.D.   On: 10/19/2022 19:48    EKG: I independently viewed the EKG done and my findings are as followed: None available at the time of this visit.  Assessment/Plan Present on Admission:  Rectal bleeding  Principal Problem:   Rectal bleeding Active Problems:   Status post colon polypectomy   Generalized abdominal pain  Lower GI bleed status post  polypectomy, sigmoid mass biopsies, POA Colonoscopy done on 10/19/2022 Seen by GI Initial hemoglobin 14.2, downtrending to 13.1. Continue IV antibiotics as recommended by GI On IV ciprofloxacin and IV Flagyl, allergic to penicillin Serial H&H every 6 hours CT angio abdomen and pelvis was done in the ED, due to ongoing GI bleed, additional 3 bloody stool self-reported Gentle IV fluid hydration Maintain MAP greater than 65.  Sigmoid mass, biopsy sent to pathology. GI recommended CT the chest, abdomen and pelvis, to obtain CEA level Defer further imaging to GI, CTAngio GI bleed was done in the ED, no active bleeding or perforation seen. Continue gentle IV fluid hydration post IV contrast CEA ordered  Type 2 diabetes with hyperglycemia Presented with serum glucose of 182 N.p.o. as recommended by GI Last hemoglobin A1c 7.7 on 08/03/2022. Insulin sliding scale every 4 hours while NPO  Essential hypertension Hold off home oral antihypertensives Avoid hypotension Closely monitor vital signs.  Hyperbilirubinemia T. bili 1.3 Hold off home statin for now  Findings from CT scan (CT A GI Bleed 10/19/22): VASCULAR   1. No evidence of active gastrointestinal bleeding. 2. 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 3. 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 No evidence of rupture.   NON-VASCULAR   1. 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 Adjacent intramural gas likely reflects sequela of biopsy during recent colonoscopy. No evidence of perforation. 2. Wall thickening of the medial aspect of the cecum, with punctate foci of intramural gas, again consistent with sequela of biopsy during recent colonoscopy. No evidence of perforation. 3. Small amount of reactive free fluid within the  lower quadrants of the abdomen, right greater than left. 4. Cholelithiasis without cholecystitis.     DVT prophylaxis: SCDs.  Pharmacological DVT prophylaxis contraindicated at this time due to ongoing lower GI bleed.  Code Status: Full code.  Family Communication: Updated his significant other at bedside.  Disposition Plan: Admitted to progressive care unit.  Consults called: [facility] consulted  Admission status: Observation status.   Status is: Observation    Darlin Drop MD [facility] Pager [phone]  If 7PM-7AM, please contact night-coverage [email or web address] Password [facility]  10/19/2022, 10:02 PM

## 2022-10-19 NOTE — Telephone Encounter (Signed)
Forwarding to on Call Dr. Adela Lank

## 2022-10-19 NOTE — Progress Notes (Signed)
PHARMACY NOTE:  ANTIMICROBIAL RENAL DOSAGE ADJUSTMENT  Current antimicrobial regimen includes a mismatch between antimicrobial dosage and estimated renal function.  As per policy approved by the Pharmacy & Therapeutics and Medical Executive Committees, the antimicrobial dosage will be adjusted accordingly.  Current antimicrobial dosage:  Cipro  IV q24h  Indication: Lower GI bleed status post polypectomy, sigmoid mass biopsies, POA ; PCN allergy  Renal Function:  Estimated Creatinine Clearance: 150 mL/min (by C-G formula based on SCr of 0.84 mg/dL).      On intermittent HD, scheduled:      On CRRT    Antimicrobial dosage has been changed to:  Cipro  IV q12h  Additional comments:   Thank you for allowing pharmacy to be a part of this patient's care.  Junita Push, [facility] 10/19/2022 11:29 PM

## 2022-10-19 NOTE — Consult Note (Signed)
Consultation  Referring Provider:     Vivi Barrack Primary Care Physician:  Etta Grandchild, MD Primary Gastroenterologist:       Dr. Awanda Mink  Reason for Consultation:     abdominal pain, rectal bleeding         HPI:   Brandon Ruiz is a 47 y.o. male with a history of diabetes, hypertension, hyperlipidemia, had a positive Cologuard which led to a colonoscopy with Dr. Marina Goodell earlier this afternoon.  He had a sigmoid colon mass concerning for malignancy which was biopsied.  He also had numerous polyps removed from his colon, largest was in the cecum requiring EMR for removal.  Multiple hot snare was performed during this exam.  He states shortly after he got home he developed abdominal pain and bright red blood per rectum.  He had multiple episodes of passing bright red blood per rectum, severe diffuse abdominal pain, more localized to the right abdomen.  In the ED he was tachycardic but BP stable.  Hemoglobin initially normal at 14, but white blood cell count noted to be elevated at 16's.  He is diaphoretic.Marland Kitchen  He passed another episode of bright red blood upon arrival.  Taken for emergent CTA to rule out perforation and try to localize bleeding.  CTA shows no evidence of obvious perforation but some inflammatory changes in the right colon and sigmoid colon.  Negative for active hemorrhage/extravasation.  I do see the sigmoid mass but there is no obvious evidence of metastatic disease.  Upon evaluation in the ED this evening he has been given some fentanyl and is a bit more comfortable but his abdomen remains tense and sore, he continues to have pain.  He states the frequency and volume of bleeding appears to be reducing, thinks subjectively this is getting better.  He has not eaten anything or drank anything this evening.  Antibiotics ordered in the ED, of note he has a penicillin allergy.  Reviewed the events and CT imaging together.  He reports feeling slightly more comfortable than when  he came to the ED earlier.    Past Medical History:  Diagnosis Date   Diabetes mellitus without complication (HCC)    Hyperlipidemia    Hypertension     Past Surgical History:  Procedure Laterality Date   APPENDECTOMY     INCISION AND DRAINAGE OF WOUND Right 05/22/2022   Procedure: IRRIGATION AND DEBRIDEMENT RIGHT GROIN;  Surgeon: Kinsinger, De Blanch, MD;  Location: [facility];  Service: General;  Laterality: Right;    Family History  Problem Relation Age of Onset   Lung cancer Father    Colon polyps Neg Hx    Colon cancer Neg Hx    Esophageal cancer Neg Hx    Stomach cancer Neg Hx    Rectal cancer Neg Hx      Social History   Tobacco Use   Smoking status: Never   Smokeless tobacco: Never  Vaping Use   Vaping Use: Never used  Substance Use Topics   Alcohol use: Yes    Alcohol/week: 3.0 standard drinks of alcohol    Types: 3 Cans of beer per week   Drug use: Never    Prior to Admission medications   Medication Sig Start Date End Date Taking? Authorizing Provider  acetaminophen (TYLENOL) 500 MG tablet Take 500-1,500 mg by mouth 2 (two) times daily as needed for fever (for pain). Patient not taking: Reported on 09/20/2022    [provider]  Continuous Blood Gluc Receiver (DEXCOM G7 RECEIVER) DEVI 1 Act by Does not apply route daily. Patient not taking: Reported on 09/20/2022 08/03/22   Etta Grandchild, MD  Continuous Blood Gluc Sensor (DEXCOM G7 SENSOR) MISC 1 Act by Does not apply route daily. Patient not taking: Reported on 09/20/2022 08/03/22   Etta Grandchild, MD  Dasiglucagon HCl (ZEGALOGUE) 0.6 MG/0.6ML SOAJ Inject 1 Act into the skin daily as needed. 08/06/22   Etta Grandchild, MD  Insulin Glargine [facility] KWIKPEN) 100 UNIT/ML Inject 30 Units into the skin at bedtime. 08/03/22 10/19/22  Etta Grandchild, MD  insulin lispro (HUMALOG) 100 UNIT/ML injection Inject 100 Units into the skin as directed.    [provider]  metFORMIN (GLUCOPHAGE) 1000 MG  tablet TAKE ONE TABLET BY MOUTH WITH A MEAL TWICE DAILY 08/08/22   Etta Grandchild, MD  olmesartan (BENICAR) 20 MG tablet Take 1 tablet (20 mg total) by mouth daily. 08/03/22   Etta Grandchild, MD  rosuvastatin (CRESTOR) 10 MG tablet Take 1 tablet (10 mg total) by mouth daily. 08/03/22   Etta Grandchild, MD    Current Facility-Administered Medications  Medication Dose Route Frequency Provider Last Rate Last Admin   ciprofloxacin (CIPRO) IVPB 400 mg  400 mg Intravenous Once Loetta Rough, MD       metroNIDAZOLE (FLAGYL) IVPB 500 mg  500 mg Intravenous Q12H Loetta Rough, MD       Current Outpatient Medications  Medication Sig Dispense Refill   acetaminophen (TYLENOL) 500 MG tablet Take 500-1,500 mg by mouth 2 (two) times daily as needed for fever (for pain). (Patient not taking: Reported on 09/20/2022)     Continuous Blood Gluc Receiver (DEXCOM G7 RECEIVER) DEVI 1 Act by Does not apply route daily. (Patient not taking: Reported on 09/20/2022) 9 each 1   Continuous Blood Gluc Sensor (DEXCOM G7 SENSOR) MISC 1 Act by Does not apply route daily. (Patient not taking: Reported on 09/20/2022) 9 each 1   Dasiglucagon HCl (ZEGALOGUE) 0.6 MG/0.6ML SOAJ Inject 1 Act into the skin daily as needed. 0.6 mL 5   Insulin Glargine (BASAGLAR KWIKPEN) 100 UNIT/ML Inject 30 Units into the skin at bedtime. 27 mL 0   insulin lispro (HUMALOG) 100 UNIT/ML injection Inject 100 Units into the skin as directed.     metFORMIN (GLUCOPHAGE) 1000 MG tablet TAKE ONE TABLET BY MOUTH WITH A MEAL TWICE DAILY 180 tablet 0   olmesartan (BENICAR) 20 MG tablet Take 1 tablet (20 mg total) by mouth daily. 90 tablet 0   rosuvastatin (CRESTOR) 10 MG tablet Take 1 tablet (10 mg total) by mouth daily. 90 tablet 1    Allergies as of 10/19/2022 - Review Complete 10/19/2022  Allergen Reaction Noted   Penicillins Itching 08/19/2018     Review of Systems:    As per HPI, otherwise negative    Physical Exam:  Vital signs in last 24  hours: Temp:  [97.7 F (36.5 C)-98.4 F (36.9 C)] 97.7 F (36.5 C) (04/25 1711) Pulse Rate:  [85-121] 113 (04/25 1745) Resp:  [13-38] 19 (04/25 1745) BP: (95-186)/(69-120) 127/80 (04/25 1745) SpO2:  [85 %-100 %] 100 % (04/25 1745) Weight:  [111.1 kg] 111.1 kg (04/25 1712)   General:   Pleasant male, diaphoretic Head:  Normocephalic and atraumatic. Eyes:   No icterus.   Conjunctiva pink. Ears:  Normal auditory acuity. Neck:  Supple Lungs:  Respirations even and unlabored.  Heart:  Regular but tachycardic Abdomen:  Soft, diffusely tender which seems to localize to the RLQ, some rebound / guarding noted,  No appreciable masses or hepatomegaly.  Rectal:  Not performed.  Msk:  Symmetrical without gross deformities.  Extremities:  Without edema. Neurologic:  Alert and  oriented x4;  grossly normal neurologically. Skin:  Intact without significant lesions or rashes. Psych:  Alert and cooperative. Normal affect.  LAB RESULTS: Recent Labs    10/19/22 1739  WBC 16.3*  HGB 14.2  HCT 42.7  PLT 269   BMET Recent Labs    10/19/22 1739  NA 137  K 3.5  CL 105  CO2 22  GLUCOSE 182*  BUN 13  CREATININE 0.84  CALCIUM 8.9   LFT Recent Labs    10/19/22 1739  PROT 6.7  ALBUMIN 4.1  AST 14*  ALT 15  ALKPHOS 70  BILITOT 1.3*   PT/INR Recent Labs    10/19/22 1752  LABPROT 13.8  INR 1.1    STUDIES: CT ANGIO GI BLEED  Result Date: 10/19/2022 CLINICAL DATA:  Rectal bleeding after colonoscopy, known sigmoid mass, multiple polyps resected EXAM: CTA ABDOMEN AND PELVIS WITHOUT AND WITH CONTRAST TECHNIQUE: Multidetector CT imaging of the abdomen and pelvis was performed using the standard protocol during bolus administration of intravenous contrast. Multiplanar reconstructed images and MIPs were obtained and reviewed to evaluate the vascular anatomy. RADIATION DOSE REDUCTION: This exam was performed according to the departmental dose-optimization program which includes automated  exposure control, adjustment of the mA and/or kV according to patient size and/or use of iterative reconstruction technique. CONTRAST:  OMNIPAQUE IOHEXOL 350 MG/ML SOLN COMPARISON:  None Available. FINDINGS: VASCULAR Aorta: Normal caliber aorta without aneurysm, dissection, vasculitis or significant stenosis. Celiac: Patent without evidence of aneurysm, dissection, vasculitis or significant stenosis. 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 Maximal diameter of the mid SMA measures 1.4 cm reference image 107/6. The dissection extends approximately 4.5 cm in length. Distal branches of the SMA remain patent. No significant stenosis or vasculitis. 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 Reference image 105/6. Otherwise the bilateral renal arteries are patent without evidence of dissection, vasculitis, fibromuscular dysplasia, or significant stenosis. IMA: Patent without evidence of aneurysm, dissection, vasculitis or significant stenosis. Inflow: Patent without evidence of aneurysm, dissection, vasculitis or significant stenosis. Proximal Outflow: Bilateral common femoral and visualized portions of the superficial and profunda femoral arteries are patent without evidence of aneurysm, dissection, vasculitis or significant stenosis. Veins: No obvious venous abnormality within the limitations of this arterial phase study. Review of the MIP images confirms the above findings. NON-VASCULAR Lower chest: Hypoventilatory changes at the lung bases. Hepatobiliary: Calcified gallstones without cholecystitis. The liver is unremarkable. Pancreas: Unremarkable. No pancreatic ductal dilatation or surrounding inflammatory changes. Spleen: Normal in size without focal abnormality. Adrenals/Urinary Tract: Adrenal glands are unremarkable. Kidneys are normal, without renal  calculi, focal lesion, or hydronephrosis. Bladder is unremarkable. Stomach/Bowel: No bowel obstruction or ileus. Annular mass is seen within the mid sigmoid colon, reference image 85/18, compatible with malignancy. There is a small amount of intramural gas within the sigmoid colon just distal to the annular mass, likely sequela of recent biopsy. Additionally, there is mural thickening within the medial aspect of the cecum, reference image 74/18, with punctate foci of intramural gas. By report, the patient had a polypectomy in this region during colonoscopy, this likely  reflects postprocedural change. There is no evidence of frank bowel perforation. Free fluid within the bilateral lower quadrants is low in attenuation, and less dense than the bowel contents, consistent with reactive fluid. There is no evidence of intraluminal contrast accumulation to suggest active gastrointestinal hemorrhage. Lymphatic: No pathologic adenopathy within the abdomen or pelvis. Reproductive: Prostate is unremarkable. Other: Trace free fluid within the bilateral lower quadrants. No free intraperitoneal gas. No abdominal wall hernia. Musculoskeletal: No acute or destructive bony lesions. Degenerative changes of the bilateral hips. Reconstructed images demonstrate no additional findings. IMPRESSION: VASCULAR 1. No evidence of active gastrointestinal bleeding. 2. 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 3. 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 No evidence of rupture. NON-VASCULAR 1. 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 Adjacent intramural gas likely reflects sequela of biopsy during recent colonoscopy. No evidence of perforation. 2. Wall thickening of the medial aspect of the cecum, with punctate foci of intramural gas, again  consistent with sequela of biopsy during recent colonoscopy. No evidence of perforation. 3. Small amount of reactive free fluid within the lower quadrants of the abdomen, right greater than left. 4. Cholelithiasis without cholecystitis. Electronically Signed   By: Sharlet Salina M.D.   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  There is no evidence of overt perforation.  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  Fortunately his hemoglobin is stable and there is no active extravasation on CTA.  In regards to frequency and volume of what he is putting out that appears to have decreased since he came to the hospital.  He may be bleeding from the biopsy sites of the mass which was quite friable versus one of the polypectomy sites.  He is tachycardic but BP stable, I think his pain is likely driving tachycardia currently.  Given some fentanyl for pain control which has helped.  Recommending IV antibiotics with Cipro and Flagyl given his penicillin allergy.  Keep n.p.o. tonight.  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 Hopefully this tapers off on its own overnight.  Recommending admission to the hospital this evening, we will keep an eye on him closely overnight and reassess him in the morning.  If he has active/significant bleeding and warrants an intervention otherwise tonight for this issue, please contact me I can come back to reassess him.  Keeping him n.p.o. in case  he needs any other intervention this evening.  Patient understanding of what has happened, all questions answered.  Fortunately his malignancy appears localized to his sigmoid colon, no obvious evidence of metastatic disease.  He will need further CT staging imaging as outpatient and surgical consultation as outpatient.  PLAN: - NPO - please start IV antibiotics - Cipro / flagyl given PCN allergy - Fentanyl PRN for pain control -  monitor for worsening bleeding, trend Hgb - holding off on colonoscopy at the moment given his abdominal pain, hopefully bleeding continues to taper off and stop on its own. CTA negative for active extravasation.   Appreciate ED care of this patient, will monitor closely tonight. Please call if questions or changes in his status.  Harlin Rain, MD [facility]

## 2022-10-19 NOTE — Progress Notes (Signed)
Called to room to assist during endoscopic procedure.  Patient ID and intended procedure confirmed with present staff. Received instructions for my participation in the procedure from the performing physician.  

## 2022-10-20 ENCOUNTER — Telehealth: Payer: Self-pay

## 2022-10-20 ENCOUNTER — Other Ambulatory Visit: Payer: Self-pay

## 2022-10-20 DIAGNOSIS — K9189 Other postprocedural complications and disorders of digestive system: Secondary | ICD-10-CM | POA: Diagnosis not present

## 2022-10-20 DIAGNOSIS — Z79899 Other long term (current) drug therapy: Secondary | ICD-10-CM | POA: Diagnosis not present

## 2022-10-20 DIAGNOSIS — I722 Aneurysm of renal artery: Secondary | ICD-10-CM | POA: Diagnosis present

## 2022-10-20 DIAGNOSIS — K922 Gastrointestinal hemorrhage, unspecified: Secondary | ICD-10-CM | POA: Diagnosis present

## 2022-10-20 DIAGNOSIS — Y838 Other surgical procedures as the cause of abnormal reaction of the patient, or of later complication, without mention of misadventure at the time of the procedure: Secondary | ICD-10-CM | POA: Diagnosis present

## 2022-10-20 DIAGNOSIS — I10 Essential (primary) hypertension: Secondary | ICD-10-CM | POA: Diagnosis present

## 2022-10-20 DIAGNOSIS — K91840 Postprocedural hemorrhage and hematoma of a digestive system organ or structure following a digestive system procedure: Secondary | ICD-10-CM

## 2022-10-20 DIAGNOSIS — D62 Acute posthemorrhagic anemia: Secondary | ICD-10-CM | POA: Diagnosis present

## 2022-10-20 DIAGNOSIS — E785 Hyperlipidemia, unspecified: Secondary | ICD-10-CM | POA: Diagnosis present

## 2022-10-20 DIAGNOSIS — Z880 Allergy status to penicillin: Secondary | ICD-10-CM | POA: Diagnosis not present

## 2022-10-20 DIAGNOSIS — K633 Ulcer of intestine: Secondary | ICD-10-CM | POA: Diagnosis present

## 2022-10-20 DIAGNOSIS — K8020 Calculus of gallbladder without cholecystitis without obstruction: Secondary | ICD-10-CM | POA: Diagnosis present

## 2022-10-20 DIAGNOSIS — Z7984 Long term (current) use of oral hypoglycemic drugs: Secondary | ICD-10-CM | POA: Diagnosis not present

## 2022-10-20 DIAGNOSIS — K625 Hemorrhage of anus and rectum: Secondary | ICD-10-CM | POA: Diagnosis not present

## 2022-10-20 DIAGNOSIS — Z801 Family history of malignant neoplasm of trachea, bronchus and lung: Secondary | ICD-10-CM | POA: Diagnosis not present

## 2022-10-20 DIAGNOSIS — Z794 Long term (current) use of insulin: Secondary | ICD-10-CM | POA: Diagnosis not present

## 2022-10-20 DIAGNOSIS — E1165 Type 2 diabetes mellitus with hyperglycemia: Secondary | ICD-10-CM | POA: Diagnosis present

## 2022-10-20 DIAGNOSIS — K6389 Other specified diseases of intestine: Secondary | ICD-10-CM

## 2022-10-20 LAB — HEMOGLOBIN AND HEMATOCRIT, BLOOD
HCT: 32.4 % — ABNORMAL LOW (ref 39.0–52.0)
HCT: 33.5 % — ABNORMAL LOW (ref 39.0–52.0)
HCT: 32.7 % — ABNORMAL LOW (ref 39.0–52.0)
Hemoglobin: 10.9 g/dL — ABNORMAL LOW (ref 13.0–17.0)
Hemoglobin: 11.2 g/dL — ABNORMAL LOW (ref 13.0–17.0)
Hemoglobin: 10.8 g/dL — ABNORMAL LOW (ref 13.0–17.0)

## 2022-10-20 LAB — COMPREHENSIVE METABOLIC PANEL
ALT: 14 U/L (ref 0–44)
AST: 12 U/L — ABNORMAL LOW (ref 15–41)
Albumin: 3.2 g/dL — ABNORMAL LOW (ref 3.5–5.0)
Alkaline Phosphatase: 52 U/L (ref 38–126)
Anion gap: 8 (ref 5–15)
BUN: 12 mg/dL (ref 6–20)
CO2: 25 mmol/L (ref 22–32)
Calcium: 8.5 mg/dL — ABNORMAL LOW (ref 8.9–10.3)
Chloride: 103 mmol/L (ref 98–111)
Creatinine, Ser: 0.78 mg/dL (ref 0.61–1.24)
GFR, Estimated: >60 mL/min (ref >60)
Glucose, Bld: 129 mg/dL — ABNORMAL HIGH (ref 70–99)
Potassium: 3.7 mmol/L (ref 3.5–5.1)
Sodium: 136 mmol/L (ref 135–145)
Total Bilirubin: 1.3 mg/dL — ABNORMAL HIGH (ref 0.3–1.2)
Total Protein: 5.7 g/dL — ABNORMAL LOW (ref 6.5–8.1)

## 2022-10-20 LAB — CBC
HCT: 33.8 % — ABNORMAL LOW (ref 39.0–52.0)
Hemoglobin: 11.5 g/dL — ABNORMAL LOW (ref 13.0–17.0)
MCH: 30.3 pg (ref 26.0–34.0)
MCHC: 34 g/dL (ref 30.0–36.0)
MCV: 88.9 fL (ref 80.0–100.0)
Platelets: 212 10*3/uL (ref 150–400)
RBC: 3.8 MIL/uL — ABNORMAL LOW (ref 4.22–5.81)
RDW: 13.6 % (ref 11.5–15.5)
WBC: 13.1 10*3/uL — ABNORMAL HIGH (ref 4.0–10.5)
nRBC: 0 % (ref 0.0–0.2)

## 2022-10-20 LAB — GLUCOSE, CAPILLARY
Glucose-Capillary: 152 mg/dL — ABNORMAL HIGH (ref 70–99)
Glucose-Capillary: 124 mg/dL — ABNORMAL HIGH (ref 70–99)
Glucose-Capillary: 127 mg/dL — ABNORMAL HIGH (ref 70–99)
Glucose-Capillary: 146 mg/dL — ABNORMAL HIGH (ref 70–99)
Glucose-Capillary: 135 mg/dL — ABNORMAL HIGH (ref 70–99)
Glucose-Capillary: 195 mg/dL — ABNORMAL HIGH (ref 70–99)

## 2022-10-20 LAB — MAGNESIUM: Magnesium: 1.9 mg/dL (ref 1.7–2.4)

## 2022-10-20 LAB — PHOSPHORUS: Phosphorus: 4.5 mg/dL (ref 2.5–4.6)

## 2022-10-20 LAB — CEA: CEA: <2 ng/mL

## 2022-10-20 LAB — ABO/RH: ABO/RH(D): B POS

## 2022-10-20 NOTE — Progress Notes (Addendum)
[facility] Progress Note  CC:  abdominal pain, rectal bleeding   Subjective:  Pain is better.  Still having a decent amount of bleeding.  Passed blood at 4 AM and then just about an hour ago, which I saw in the toilet bowl (large amount of red blood).  Is asking if we think he may be out of here before tomorrow night as he has tickets for a concert in [place].  Objective:  Vital signs in last 24 hours: Temp:  [97.7 F (36.5 C)-99.9 F (37.7 C)] 99.8 F (37.7 C) (04/26 0448) Pulse Rate:  [85-121] 105 (04/26 0448) Resp:  [13-38] 18 (04/26 0448) BP: (95-186)/(55-120) 122/74 (04/26 0448) SpO2:  [85 %-100 %] 93 % (04/26 0448) Weight:  [111.1 kg-112.3 kg] 112.3 kg (04/26 0448)   General:  Alert, Well-developed, in NAD Heart:  Regular rate and rhythm; no murmurs Pulm:  CTAB.  No W/R/R. Abdomen:  Soft, non-distended.  BS present.  Minimal TTP. Extremities:  Without edema. Neurologic:  Alert and oriented x 4;  grossly normal neurologically. Psych:  Alert and cooperative. Normal mood and affect.  Intake/Output from previous day: 04/25 0701 - 04/26 0700 In: 1280.5 [P.O.:60; I.V.:1021.9; IV Piggyback:198.6] Out: -   Lab Results: Recent Labs    10/19/22 1739 10/19/22 2045 10/19/22 2323 10/20/22 0417  WBC 16.3*  --   --  13.1*  HGB 14.2 13.1 12.4* 11.5*  HCT 42.7  --  36.5* 33.8*  PLT 269  --   --  212   BMET Recent Labs    10/19/22 1739 10/20/22 0417  NA 137 136  K 3.5 3.7  CL 105 103  CO2 22 25  GLUCOSE 182* 129*  BUN 13 12  CREATININE 0.84 0.78  CALCIUM 8.9 8.5*   LFT Recent Labs    10/20/22 0417  PROT 5.7*  ALBUMIN 3.2*  AST 12*  ALT 14  ALKPHOS 52  BILITOT 1.3*   PT/INR Recent Labs    10/19/22 1752  LABPROT 13.8  INR 1.1   CT ANGIO GI BLEED  Result Date: 10/19/2022 CLINICAL DATA:  Rectal bleeding after colonoscopy, known sigmoid mass, multiple polyps resected EXAM: CTA ABDOMEN AND PELVIS WITHOUT AND WITH CONTRAST TECHNIQUE:  Multidetector CT imaging of the abdomen and pelvis was performed using the standard protocol during bolus administration of intravenous contrast. Multiplanar reconstructed images and MIPs were obtained and reviewed to evaluate the vascular anatomy. RADIATION DOSE REDUCTION: This exam was performed according to the departmental dose-optimization program which includes automated exposure control, adjustment of the mA and/or kV according to patient size and/or use of iterative reconstruction technique. CONTRAST:  OMNIPAQUE IOHEXOL 350 MG/ML SOLN COMPARISON:  None Available. FINDINGS: VASCULAR Aorta: Normal caliber aorta without aneurysm, dissection, vasculitis or significant stenosis. Celiac: Patent without evidence of aneurysm, dissection, vasculitis or significant stenosis. 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 Maximal diameter of the mid SMA measures 1.4 cm reference image 107/6. The dissection extends approximately 4.5 cm in length. Distal branches of the SMA remain patent. No significant stenosis or vasculitis. 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 Reference image 105/6. Otherwise the bilateral renal arteries are patent without evidence of dissection, vasculitis, fibromuscular dysplasia, or significant stenosis. IMA: Patent without evidence of aneurysm, dissection, vasculitis or significant stenosis. Inflow: Patent without evidence of aneurysm, dissection, vasculitis  or significant stenosis. Proximal Outflow: Bilateral common femoral and visualized portions of the superficial and profunda femoral arteries are patent without evidence of aneurysm, dissection, vasculitis or significant stenosis. Veins: No obvious venous abnormality within the limitations of this arterial phase study. Review of the MIP images confirms the above findings. NON-VASCULAR  Lower chest: Hypoventilatory changes at the lung bases. Hepatobiliary: Calcified gallstones without cholecystitis. The liver is unremarkable. Pancreas: Unremarkable. No pancreatic ductal dilatation or surrounding inflammatory changes. Spleen: Normal in size without focal abnormality. Adrenals/Urinary Tract: Adrenal glands are unremarkable. Kidneys are normal, without renal calculi, focal lesion, or hydronephrosis. Bladder is unremarkable. Stomach/Bowel: No bowel obstruction or ileus. Annular mass is seen within the mid sigmoid colon, reference image 85/18, compatible with malignancy. There is a small amount of intramural gas within the sigmoid colon just distal to the annular mass, likely sequela of recent biopsy. Additionally, there is mural thickening within the medial aspect of the cecum, reference image 74/18, with punctate foci of intramural gas. By report, the patient had a polypectomy in this region during colonoscopy, this likely reflects postprocedural change. There is no evidence of frank bowel perforation. Free fluid within the bilateral lower quadrants is low in attenuation, and less dense than the bowel contents, consistent with reactive fluid. There is no evidence of intraluminal contrast accumulation to suggest active gastrointestinal hemorrhage. Lymphatic: No pathologic adenopathy within the abdomen or pelvis. Reproductive: Prostate is unremarkable. Other: Trace free fluid within the bilateral lower quadrants. No free intraperitoneal gas. No abdominal wall hernia. Musculoskeletal: No acute or destructive bony lesions. Degenerative changes of the bilateral hips. Reconstructed images demonstrate no additional findings. IMPRESSION: VASCULAR 1. No evidence of active gastrointestinal bleeding. 2. 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 3. 1.2 cm right  renal artery aneurysm at the level of the renal hilum. No evidence of rupture. NON-VASCULAR 1. 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 Adjacent intramural gas likely reflects sequela of biopsy during recent colonoscopy. No evidence of perforation. 2. Wall thickening of the medial aspect of the cecum, with punctate foci of intramural gas, again consistent with sequela of biopsy during recent colonoscopy. No evidence of perforation. 3. Small amount of reactive free fluid within the lower quadrants of the abdomen, right greater than left. 4. Cholelithiasis without cholecystitis. Electronically Signed   By: Sharlet Salina M.D.   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  There is no active extravasation on CTA either.  He may be bleeding from the biopsy sites of the mass which was quite friable versus one of the polypectomy sites.  WBC count down from 16.3K to 13.1K on cipro and flagyl.  Hgb down to 11.5 grams from about a 14 gram baseline.  - Continue NPO - Continue cipro and flagyl IV. - Trend labs/Hgb. - holding off on colonoscopy at the moment, hopefully bleeding tapers off and stop on its own. CTA negative for active extravasation.  But if bleeding continues throughout the day today may need  repeat procedure tomorrow, 4/27. -Follow-up CEA.    LOS: 0 days   Princella Pellegrini. Zehr  10/20/2022, 9:04 AM    [facility] Attending   I have taken an interval history, reviewed the chart and examined the patient. I agree with the Advanced Practitioner's note, impression and recommendations.    He is  in much less pain and RLQ is minimally tnder so post-polypectomy burn is resolving.  Bleeding is less it seems - he showed me pictures and I saw blood in toilet from 1 hr ago and agree its less.  I have ordered clears for today, NPO in AM in case I ned to scope and treat bleeding. We think this is post bx bleeding from sigmoid mass which should resolve but see what tomorrow brings.  I told him it is very unlikely he will be dc tomorrow and even if so I could not approve that he attend [employer] concert in [place].  Iva Boop, MD, [facility] See Loretha Stapler on call - gastroenterology for best contact person 10/20/2022 12:45 PM

## 2022-10-20 NOTE — Progress Notes (Signed)
PROGRESS NOTE  Brandon Ruiz MVH:[id number] DOB: 07-31-1975 DOA: 10/19/2022 PCP: Etta Grandchild, MD   LOS: 0 days   Brief Narrative / Interim history: This is a 47 year old male with HTN, DM 2, HLD, who comes into the hospital after noticing rectal bleeding post colonoscopy.  He underwent polypectomy, was found to have a sigmoid mass status post biopsies.  Following the colonoscopy, he experienced abdominal pain as well as bright red blood per rectum.  He was admitted to the hospital and GI was consulted.  A CT angiogram in the ER was negative for active bleeding/perforation  Subjective / 24h Interval events: Still with some abdominal discomfort, but overall pain has improved.  Still passing some blood  Assesement and Plan: Principal Problem:   Rectal bleeding Active Problems:   Status post colon polypectomy   Generalized abdominal pain  Principal problem Lower GI bleed, acute blood loss anemia -this following the colonoscopy on 4/25.  GI consulted and following.  He apparently still has significant amount of bleeding, continue to closely monitor.  Thankfully his abdominal pain has improved.  He may need further procedures per GI.  Discussed with Doug Sou, PA -Continue antibiotics with ciprofloxacin and metronidazole  Active problems Sigmoid mass -status post biopsy.  CEA level pending.  Pathology pending.  This will likely need further workup as an outpatient  DM2, with hyperglycemia -A1c 7.7.  For now keep on sliding scale while n.p.o. per GI  Essential hypertension -keep off antihypertensives while bleeding  Scheduled Meds:  insulin aspart  0-9 Units Subcutaneous Q4H   Continuous Infusions:  ciprofloxacin 400 mg (10/20/22 0859)   lactated ringers 50 mL/hr at 10/20/22 0500   metronidazole 500 mg (10/20/22 1043)   PRN Meds:.acetaminophen, HYDROmorphone (DILAUDID) injection, melatonin, oxyCODONE, prochlorperazine  Current Outpatient Medications  Medication Instructions    Basaglar KwikPen 30 Units, Subcutaneous, Daily at bedtime   Continuous Blood Gluc Receiver (DEXCOM G7 RECEIVER) DEVI 1 Act, Does not apply, Daily   Continuous Blood Gluc Sensor (DEXCOM G7 SENSOR) MISC 1 Act, Does not apply, Daily   Dasiglucagon HCl (ZEGALOGUE) 0.6 MG/0.6ML SOAJ 1 Act, Subcutaneous, Daily PRN   insulin lispro (HUMALOG) 20 Units, Subcutaneous, Daily   metFORMIN (GLUCOPHAGE) 1000 MG tablet TAKE ONE TABLET BY MOUTH WITH A MEAL TWICE DAILY   olmesartan (BENICAR) 20 mg, Oral, Daily   rosuvastatin (CRESTOR) 10 mg, Oral, Daily    Diet Orders (From admission, onward)     Start     Ordered   10/19/22 2007  Diet NPO time specified Except for: Sips with Meds, Ice Chips  Diet effective now       Question Answer Comment  Except for Sips with Meds   Except for Ice Chips      10/19/22 2006            DVT prophylaxis: SCDs Start: 10/19/22 2204   Lab Results  Component Value Date   PLT 212 10/20/2022      Code Status: Full Code  Family Communication: no family at bedside   Status is: Observation The patient will require care spanning > 2 midnights and should be moved to inpatient because: Ongoing bleeding, hemoglobin dropping  Level of care: Progressive  Consultants:  GI  Objective: Vitals:   10/19/22 2140 10/20/22 0014 10/20/22 0448 10/20/22 1032  BP: 133/79 (!) 106/55 122/74 118/75  Pulse: (!) 109  (!) 105 92  Resp: 18 18 18 18   Temp: 98.2 F (36.8 C) 99.9 F (37.7 C) 99.8 F (  37.7 C) 98.5 F (36.9 C)  TempSrc: Oral Oral Oral Oral  SpO2: 96% 93% 93% 95%  Weight:   112.3 kg   Height:        Intake/Output Summary (Last 24 hours) at 10/20/2022 1124 Last data filed at 10/20/2022 0700 Gross per 24 hour  Intake 1280.46 ml  Output --  Net 1280.46 ml   Wt Readings from Last 3 Encounters:  10/20/22 112.3 kg  10/19/22 111.1 kg  09/20/22 111.1 kg    Examination:  Constitutional: NAD Eyes: no scleral icterus ENMT: Mucous membranes are moist.  Neck:  normal, supple Respiratory: clear to auscultation bilaterally, no wheezing, no crackles. Normal respiratory effort. No accessory muscle use.  Cardiovascular: Regular rate and rhythm, no murmurs / rubs / gallops. No LE edema.  Abdomen: non distended, no tenderness. Bowel sounds positive.  Musculoskeletal: no clubbing / cyanosis.   Data Reviewed: I have independently reviewed following labs and imaging studies   CBC Recent Labs  Lab 10/19/22 1739 10/19/22 2045 10/19/22 2323 10/20/22 0417 10/20/22 1028  WBC 16.3*  --   --  13.1*  --   HGB 14.2 13.1 12.4* 11.5* 10.9*  HCT 42.7  --  36.5* 33.8* 32.4*  PLT 269  --   --  212  --   MCV 89.9  --   --  88.9  --   MCH 29.9  --   --  30.3  --   MCHC 33.3  --   --  34.0  --   RDW 13.3  --   --  13.6  --   LYMPHSABS 1.0  --   --   --   --   MONOABS 1.0  --   --   --   --   EOSABS 0.0  --   --   --   --   BASOSABS 0.1  --   --   --   --     Recent Labs  Lab 10/19/22 1739 10/19/22 1752 10/19/22 1930 10/20/22 0417  NA 137  --   --  136  K 3.5  --   --  3.7  CL 105  --   --  103  CO2 22  --   --  25  GLUCOSE 182*  --   --  129*  BUN 13  --   --  12  CREATININE 0.84  --   --  0.78  CALCIUM 8.9  --   --  8.5*  AST 14*  --   --  12*  ALT 15  --   --  14  ALKPHOS 70  --   --  52  BILITOT 1.3*  --   --  1.3*  ALBUMIN 4.1  --   --  3.2*  MG  --   --   --  1.9  LATICACIDVEN 1.8  --  1.5  --   INR  --  1.1  --   --     ------------------------------------------------------------------------------------------------------------------ No results for input(s): "CHOL", "HDL", "LDLCALC", "TRIG", "CHOLHDL", "LDLDIRECT" in the last 72 hours.  Lab Results  Component Value Date   HGBA1C 7.7 (H) 08/03/2022   ------------------------------------------------------------------------------------------------------------------ No results for input(s): "TSH", "T4TOTAL", "T3FREE", "THYROIDAB" in the last 72 hours.  Invalid input(s):  "FREET3"  Cardiac Enzymes No results for input(s): "CKMB", "TROPONINI", "MYOGLOBIN" in the last 168 hours.  Invalid input(s): "CK" ------------------------------------------------------------------------------------------------------------------ No results found for: "BNP"  CBG: Recent Labs  Lab 10/19/22 2151 10/20/22  0010 10/20/22 0445 10/20/22 0722  GLUCAP 167* 152* 124* 127*    No results found for this or any previous visit (from the past 240 hour(s)).   Radiology Studies: CT ANGIO GI BLEED  Result Date: 10/19/2022 CLINICAL DATA:  Rectal bleeding after colonoscopy, known sigmoid mass, multiple polyps resected EXAM: CTA ABDOMEN AND PELVIS WITHOUT AND WITH CONTRAST TECHNIQUE: Multidetector CT imaging of the abdomen and pelvis was performed using the standard protocol during bolus administration of intravenous contrast. Multiplanar reconstructed images and MIPs were obtained and reviewed to evaluate the vascular anatomy. RADIATION DOSE REDUCTION: This exam was performed according to the departmental dose-optimization program which includes automated exposure control, adjustment of the mA and/or kV according to patient size and/or use of iterative reconstruction technique. CONTRAST:  OMNIPAQUE IOHEXOL 350 MG/ML SOLN COMPARISON:  None Available. FINDINGS: VASCULAR Aorta: Normal caliber aorta without aneurysm, dissection, vasculitis or significant stenosis. Celiac: Patent without evidence of aneurysm, dissection, vasculitis or significant stenosis. 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 Maximal diameter of the mid SMA measures 1.4 cm reference image 107/6. The dissection extends approximately 4.5 cm in length. Distal branches of the SMA remain patent. No significant stenosis or vasculitis. 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  Reference image 105/6. Otherwise the bilateral renal arteries are patent without evidence of dissection, vasculitis, fibromuscular dysplasia, or significant stenosis. IMA: Patent without evidence of aneurysm, dissection, vasculitis or significant stenosis. Inflow: Patent without evidence of aneurysm, dissection, vasculitis or significant stenosis. Proximal Outflow: Bilateral common femoral and visualized portions of the superficial and profunda femoral arteries are patent without evidence of aneurysm, dissection, vasculitis or significant stenosis. Veins: No obvious venous abnormality within the limitations of this arterial phase study. Review of the MIP images confirms the above findings. NON-VASCULAR Lower chest: Hypoventilatory changes at the lung bases. Hepatobiliary: Calcified gallstones without cholecystitis. The liver is unremarkable. Pancreas: Unremarkable. No pancreatic ductal dilatation or surrounding inflammatory changes. Spleen: Normal in size without focal abnormality. Adrenals/Urinary Tract: Adrenal glands are unremarkable. Kidneys are normal, without renal calculi, focal lesion, or hydronephrosis. Bladder is unremarkable. Stomach/Bowel: No bowel obstruction or ileus. Annular mass is seen within the mid sigmoid colon, reference image 85/18, compatible with malignancy. There is a small amount of intramural gas within the sigmoid colon just distal to the annular mass, likely sequela of recent biopsy. Additionally, there is mural thickening within the medial aspect of the cecum, reference image 74/18, with punctate foci of intramural gas. By report, the patient had a polypectomy in this region during colonoscopy, this likely reflects postprocedural change. There is no evidence of frank bowel perforation. Free fluid within the bilateral lower quadrants is low in attenuation, and less dense than the bowel contents, consistent with reactive fluid. There is no evidence of intraluminal contrast accumulation to  suggest active gastrointestinal hemorrhage. Lymphatic: No pathologic adenopathy within the abdomen or pelvis. Reproductive: Prostate is unremarkable. Other: Trace free fluid within the bilateral lower quadrants. No free intraperitoneal gas. No abdominal wall hernia. Musculoskeletal: No acute or destructive bony lesions. Degenerative changes of the bilateral hips. Reconstructed images demonstrate no additional findings. IMPRESSION: VASCULAR 1. No evidence of active gastrointestinal bleeding. 2. 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  3. 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 No evidence of rupture. NON-VASCULAR 1. 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 Adjacent intramural gas likely reflects sequela of biopsy during recent colonoscopy. No evidence of perforation. 2. Wall thickening of the medial aspect of the cecum, with punctate foci of intramural gas, again consistent with sequela of biopsy during recent colonoscopy. No evidence of perforation. 3. Small amount of reactive free fluid within the lower quadrants of the abdomen, right greater than left. 4. Cholelithiasis without cholecystitis. Electronically Signed   By: Sharlet Salina M.D.   On: 10/19/2022 19:48     Pamella Pert, MD, PhD [facility]  Between 7 am - 7 pm I am available, please contact me via Amion (for emergencies) or Securechat (non urgent messages)  Between 7 pm - 7 am I am not available, please contact night coverage MD/APP via Amion

## 2022-10-20 NOTE — Telephone Encounter (Signed)
Attempted f/u call. No answer, VM not setup. 

## 2022-10-21 DIAGNOSIS — K625 Hemorrhage of anus and rectum: Secondary | ICD-10-CM | POA: Diagnosis not present

## 2022-10-21 DIAGNOSIS — K91840 Postprocedural hemorrhage and hematoma of a digestive system organ or structure following a digestive system procedure: Secondary | ICD-10-CM | POA: Diagnosis not present

## 2022-10-21 DIAGNOSIS — K9189 Other postprocedural complications and disorders of digestive system: Secondary | ICD-10-CM | POA: Diagnosis not present

## 2022-10-21 LAB — HEMOGLOBIN AND HEMATOCRIT, BLOOD
HCT: 32.2 % — ABNORMAL LOW (ref 39.0–52.0)
HCT: 32.8 % — ABNORMAL LOW (ref 39.0–52.0)
Hemoglobin: 10.5 g/dL — ABNORMAL LOW (ref 13.0–17.0)
Hemoglobin: 11 g/dL — ABNORMAL LOW (ref 13.0–17.0)

## 2022-10-21 LAB — GLUCOSE, CAPILLARY
Glucose-Capillary: 113 mg/dL — ABNORMAL HIGH (ref 70–99)
Glucose-Capillary: 117 mg/dL — ABNORMAL HIGH (ref 70–99)
Glucose-Capillary: 119 mg/dL — ABNORMAL HIGH (ref 70–99)
Glucose-Capillary: 120 mg/dL — ABNORMAL HIGH (ref 70–99)

## 2022-10-21 MED ORDER — METRONIDAZOLE 500 MG PO TABS: 500.0000 mg | ORAL_TABLET | Freq: Two times a day (BID) | ORAL | 0 refills | Status: AC

## 2022-10-21 MED ORDER — CIPROFLOXACIN HCL 500 MG PO TABS: 500.0000 mg | ORAL_TABLET | Freq: Two times a day (BID) | ORAL | 0 refills | Status: AC

## 2022-10-21 NOTE — Discharge Summary (Signed)
Physician Discharge Summary  Brandon Ruiz ZOX:[id number] DOB: [birth year] DOA: 10/19/2022  PCP: Etta Grandchild, MD  Admit date: 10/19/2022 Discharge date: 10/21/2022  Admitted From: home Disposition:  home  Recommendations for Outpatient Follow-up:  Follow up with PCP in 1-2 weeks Please obtain BMP/CBC in one week Please follow up on the following pending results: sigmoid mass biopsy   Home Health: none Equipment/Devices: none  Discharge Condition: stable CODE STATUS: Full code  HPI: Per admitting MD, Brandon Ruiz is a 47 y.o. male with medical history significant for hypertension, type 2 diabetes, hyperlipidemia, who presented to [facility] ED from home after noticing rectal bleeding, post polypectomy and sigmoid mass biopsies today.  Associated with lower abdominal pain.  The patient was initially sent today to [facility] for colonoscopy after positive Cologuard testing x 2.  Generally he had no complaints.  Never smoker, 3.0 standard drinks of alcohol per week.  Family history of lung cancer in his father.  Per colonoscopy report, 2 polyps were found in the ascending colon, this was removed with a cold snare.  More polyps were found in the colon and were also removed.  There was an ulcerated nonobstructing large mass found in the sigmoid colon.  The mass was partially circumferential involving one half of the lumen circumference, this was biopsied with a cold forcep for histology.  The mass was located at 30 cm from the anal verge.  It measured approximately 4 cm.  The area was tattooed with carbon black just distal to the lesion.  GI recommendation was to schedule contrast enhanced CT scan of the chest, abdomen and pelvis, to obtain CEA level, and to refer to general surgery, due to concern for sigmoid colon cancer, for possible resection. After his colonoscopy, he endorses constant lower abdominal pain and rectal bleeding.  He had 4 episodes of large-volume bright red blood per rectum.  He presented  to the ED for further evaluation.  In the ED had another 3 bloody stools. EDP discussed the case with GI who recommended IV antibiotics, admission for observation, and will continue to see in consultation.  IV ciprofloxacin and IV Flagyl were initiated.  The patient is allergic to penicillin.  Hospital Course / Discharge diagnoses: Principal Problem:   Rectal bleeding Active Problems:   Status post colon polypectomy   Generalized abdominal pain   Hemorrhage of colon following colonoscopy   Gastrointestinal complication of procedure   Lower GI bleeding   Principal problem Lower GI bleed, acute blood loss anemia -this following the colonoscopy on 4/25.  GI consulted and followed patient while hospitalized. He was closely monitored with serial blood counts.  He continued to have bleeding, but towards 4/27 in the afternoon his bleeding seems to have stopped, now having brown stools and hemoglobin started to increase on its own.  Discussed with Dr. Leone Payor, will discharge patient home and will have close outpatient follow-up.  Per GI recommendations we will have 3 additional days of ciprofloxacin and metronidazole.  He is also tolerating a diet without difficulties.  His abdominal pain has improved significantly.  Active problems Sigmoid mass -status post biopsy.  CEA level pending.  Pathology pending.  This will likely need further workup as an outpatient DM2, with hyperglycemia -A1c 7.7.  Resume home medications  Essential hypertension -resume home medications  Sepsis ruled out   Discharge Instructions   Allergies as of 10/21/2022       Reactions   Penicillins Itching  Medication List     TAKE these medications    Basaglar KwikPen 100 UNIT/ML Inject 30 Units into the skin at bedtime.   ciprofloxacin 500 MG tablet Commonly known as: Cipro Take 1 tablet (500 mg total) by mouth 2 (two) times daily for 3 days.   Dexcom G7 Receiver Devi 1 Act by Does not apply route  daily.   Dexcom G7 Sensor Misc 1 Act by Does not apply route daily.   insulin lispro 100 UNIT/ML injection Commonly known as: HUMALOG Inject 20 Units into the skin daily.   metFORMIN 1000 MG tablet Commonly known as: GLUCOPHAGE TAKE ONE TABLET BY MOUTH WITH A MEAL TWICE DAILY What changed: See the new instructions.   metroNIDAZOLE 500 MG tablet Commonly known as: Flagyl Take 1 tablet (500 mg total) by mouth 2 (two) times daily for 3 days.   olmesartan 20 MG tablet Commonly known as: BENICAR Take 1 tablet (20 mg total) by mouth daily.   rosuvastatin 10 MG tablet Commonly known as: Crestor Take 1 tablet (10 mg total) by mouth daily.   Zegalogue 0.6 MG/0.6ML Soaj Generic drug: Dasiglucagon HCl Inject 1 Act into the skin daily as needed.        Follow-up Information     Etta Grandchild, MD Follow up in 2 week(s).   Specialty: Internal Medicine Contact information: [address] [place] [address] [phone]                 Consultations: GI  Procedures/Studies:  CT ANGIO GI BLEED  Result Date: 10/19/2022 CLINICAL DATA:  Rectal bleeding after colonoscopy, known sigmoid mass, multiple polyps resected EXAM: CTA ABDOMEN AND PELVIS WITHOUT AND WITH CONTRAST TECHNIQUE: Multidetector CT imaging of the abdomen and pelvis was performed using the standard protocol during bolus administration of intravenous contrast. Multiplanar reconstructed images and MIPs were obtained and reviewed to evaluate the vascular anatomy. RADIATION DOSE REDUCTION: This exam was performed according to the departmental dose-optimization program which includes automated exposure control, adjustment of the mA and/or kV according to patient size and/or use of iterative reconstruction technique. CONTRAST:  OMNIPAQUE IOHEXOL 350 MG/ML SOLN COMPARISON:  None Available. FINDINGS: VASCULAR Aorta: Normal caliber aorta without aneurysm, dissection, vasculitis or significant stenosis.  Celiac: Patent without evidence of aneurysm, dissection, vasculitis or significant stenosis. 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 Maximal diameter of the mid SMA measures 1.4 cm reference image 107/6. The dissection extends approximately 4.5 cm in length. Distal branches of the SMA remain patent. No significant stenosis or vasculitis. 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 Reference image 105/6. Otherwise the bilateral renal arteries are patent without evidence of dissection, vasculitis, fibromuscular dysplasia, or significant stenosis. IMA: Patent without evidence of aneurysm, dissection, vasculitis or significant stenosis. Inflow: Patent without evidence of aneurysm, dissection, vasculitis or significant stenosis. Proximal Outflow: Bilateral common femoral and visualized portions of the superficial and profunda femoral arteries are patent without evidence of aneurysm, dissection, vasculitis or significant stenosis. Veins: No obvious venous abnormality within the limitations of this arterial phase study. Review of the MIP images confirms the above findings. NON-VASCULAR Lower chest: Hypoventilatory changes at the lung bases. Hepatobiliary: Calcified gallstones without cholecystitis. The liver is unremarkable. Pancreas: Unremarkable. No pancreatic ductal dilatation or surrounding inflammatory changes. Spleen: Normal in size without focal abnormality. Adrenals/Urinary Tract: Adrenal glands are  unremarkable. Kidneys are normal, without renal calculi, focal lesion, or hydronephrosis. Bladder is unremarkable. Stomach/Bowel: No bowel obstruction or ileus. Annular mass is seen within the mid sigmoid colon, reference image 85/18, compatible with malignancy. There is a small amount of intramural gas within the sigmoid colon just distal to the annular mass, likely  sequela of recent biopsy. Additionally, there is mural thickening within the medial aspect of the cecum, reference image 74/18, with punctate foci of intramural gas. By report, the patient had a polypectomy in this region during colonoscopy, this likely reflects postprocedural change. There is no evidence of frank bowel perforation. Free fluid within the bilateral lower quadrants is low in attenuation, and less dense than the bowel contents, consistent with reactive fluid. There is no evidence of intraluminal contrast accumulation to suggest active gastrointestinal hemorrhage. Lymphatic: No pathologic adenopathy within the abdomen or pelvis. Reproductive: Prostate is unremarkable. Other: Trace free fluid within the bilateral lower quadrants. No free intraperitoneal gas. No abdominal wall hernia. Musculoskeletal: No acute or destructive bony lesions. Degenerative changes of the bilateral hips. Reconstructed images demonstrate no additional findings. IMPRESSION: VASCULAR 1. No evidence of active gastrointestinal bleeding. 2. 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 3. 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 No evidence of rupture. NON-VASCULAR 1. 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 Adjacent intramural gas likely reflects sequela of biopsy during recent colonoscopy. No evidence of perforation. 2. Wall thickening of the medial aspect of the cecum, with punctate foci of intramural gas, again consistent with sequela of biopsy during recent colonoscopy. No evidence of perforation. 3. Small amount of reactive free fluid within the lower quadrants of the abdomen, right greater than left. 4. Cholelithiasis without cholecystitis. Electronically Signed   By: Sharlet Salina M.D.   On: 10/19/2022 19:48      Subjective: - no chest pain, shortness of breath, no abdominal pain, nausea or vomiting.   Discharge Exam: BP 120/68 (BP Location: Left Arm)   Pulse 93   Temp 98.1 F (36.7 C) (Oral)   Resp 19   Ht 6\' 4"  (1.93 m)   Wt 112.3 kg   SpO2 99%   BMI 30.14 kg/m   General: Pt is alert, awake, not in acute distress Cardiovascular: RRR, S1/S2 +, no rubs, no gallops Respiratory: CTA bilaterally, no wheezing, no rhonchi Abdominal: Soft, NT, ND, bowel sounds + Extremities: no edema, no cyanosis    The results of significant diagnostics from this hospitalization (including imaging, microbiology, ancillary and laboratory) are listed below for reference.     Microbiology: No results found for this or any previous visit (from the past 240 hour(s)).   Labs: Basic Metabolic Panel: Recent Labs  Lab 10/19/22 1739 10/20/22 0417  NA 137 136  K 3.5 3.7  CL 105 103  CO2 22 25  GLUCOSE 182* 129*  BUN 13 12  CREATININE 0.84 0.78  CALCIUM 8.9 8.5*  MG  --  1.9  PHOS  --  4.5   Liver Function Tests: Recent Labs  Lab 10/19/22 1739 10/20/22 0417  AST 14* 12*  ALT 15 14  ALKPHOS 70 52  BILITOT 1.3* 1.3*  PROT 6.7 5.7*  ALBUMIN 4.1 3.2*   CBC: Recent Labs  Lab 10/19/22 1739 10/19/22 2045 10/20/22 0417 10/20/22 1028 10/20/22 1552 10/20/22 2140 10/21/22  1610 10/21/22 1010  WBC 16.3*  --  13.1*  --   --   --   --   --   NEUTROABS 14.1*  --   --   --   --   --   --   --   HGB 14.2   < > 11.5* 10.9* 11.2* 10.8* 10.5* 11.0*  HCT 42.7   < > 33.8* 32.4* 33.5* 32.7* 32.2* 32.8*  MCV 89.9  --  88.9  --   --   --   --   --   PLT 269  --  212  --   --   --   --   --    < > = values in this interval not displayed.   CBG: Recent Labs  Lab 10/20/22 2022 10/21/22 0017 10/21/22 0449 10/21/22 0731 10/21/22 1142  GLUCAP 195* 117* 120* 113* 119*   Hgb A1c No results for input(s): "HGBA1C" in the last 72 hours. Lipid Profile No results for input(s): "CHOL", "HDL", "LDLCALC",  "TRIG", "CHOLHDL", "LDLDIRECT" in the last 72 hours. Thyroid function studies No results for input(s): "TSH", "T4TOTAL", "T3FREE", "THYROIDAB" in the last 72 hours.  Invalid input(s): "FREET3" Urinalysis    Component Value Date/Time   COLORURINE YELLOW 08/03/2022 1401   APPEARANCEUR CLEAR 08/03/2022 1401   LABSPEC 1.020 08/03/2022 1401   PHURINE 7.0 08/03/2022 1401   GLUCOSEU NEGATIVE 08/03/2022 1401   HGBUR NEGATIVE 08/03/2022 1401   BILIRUBINUR NEGATIVE 08/03/2022 1401   KETONESUR NEGATIVE 08/03/2022 1401   UROBILINOGEN 0.2 08/03/2022 1401   NITRITE NEGATIVE 08/03/2022 1401   LEUKOCYTESUR NEGATIVE 08/03/2022 1401    FURTHER DISCHARGE INSTRUCTIONS:   Get Medicines reviewed and adjusted: Please take all your medications with you for your next visit with your Primary MD   Laboratory/radiological data: Please request your Primary MD to go over all hospital tests and procedure/radiological results at the follow up, please ask your Primary MD to get all Hospital records sent to his/her office.   In some cases, they will be blood work, cultures and biopsy results pending at the time of your discharge. Please request that your primary care M.D. goes through all the records of your hospital data and follows up on these results.   Also Note the following: If you experience worsening of your admission symptoms, develop shortness of breath, life threatening emergency, suicidal or homicidal thoughts you must seek medical attention immediately by calling 911 or calling your MD immediately  if symptoms less severe.   You must read complete instructions/literature along with all the possible adverse reactions/side effects for all the Medicines you take and that have been prescribed to you. Take any new Medicines after you have completely understood and accpet all the possible adverse reactions/side effects.    Do not drive when taking Pain medications or sleeping medications (Benzodaizepines)    Do not take more than prescribed Pain, Sleep and Anxiety Medications. It is not advisable to combine anxiety,sleep and pain medications without talking with your primary care practitioner   Special Instructions: If you have smoked or chewed Tobacco  in the last 2 yrs please stop smoking, stop any regular Alcohol  and or any Recreational drug use.   Wear Seat belts while driving.   Please note: You were cared for by a hospitalist during your hospital stay. Once you are discharged, your primary care physician will handle any further medical issues. Please note that NO REFILLS for any discharge medications will be authorized once  you are discharged, as it is imperative that you return to your primary care physician (or establish a relationship with a primary care physician if you do not have one) for your post hospital discharge needs so that they can reassess your need for medications and monitor your lab values.  Time coordinating discharge: 35 minutes  SIGNED:  Pamella Pert, MD, PhD 10/21/2022, 2:23 PM

## 2022-10-21 NOTE — Progress Notes (Addendum)
   Patient Name: Brandon Ruiz Date of Encounter: 10/21/2022, 8:35 AM     Assessment and Plan  GI bleed after polypectomy and biopsy - better but not resolved  Post-polypectomy burn syndrome cecum - better  I want to revisit him to check stools later this AM - he may need a colonoscopy  D/w Dr. Elvera Lennox   10:13 AM  Had brown stool  I will start full liquids and advance to soft as tolerated He may see some slight bleeding off and on  He can dc today - plan for TR[facility] to check later in PM and make sure  Would treat w/ 3 more days po cipro and metronidazole 500 mg bid   My office will contact him about f/u.  He could return to work Monday if he feels up to it   Subjective  Pain less - still passing blood - lastwas 1 hr ago and has had 3 overnight     Objective  BP 128/72 (BP Location: Left Arm)   Pulse 94   Temp 98.7 F (37.1 C) (Oral)   Resp 18   Ht 6\' 4"  (1.93 m)   Wt 112.3 kg   SpO2 95%   BMI 30.14 kg/m  Abd mild RLQ tenderness     Latest Ref Rng & Units 10/21/2022    4:47 AM 10/20/2022    9:40 PM 10/20/2022    3:52 PM  CBC  Hemoglobin 13.0 - 17.0 g/dL 16.1  09.6  04.5   Hematocrit 39.0 - 52.0 % 32.2  32.7  33.5      Iva Boop, MD, [facility] See Loretha Stapler on call - gastroenterology for best contact person 10/21/2022 8:35 AM

## 2022-10-21 NOTE — TOC CM/SW Note (Signed)
Transition of Care [facility]) Screening Note  Patient Details  Name: Brandon Ruiz Date of Birth: 1975-09-07  Transition of Care [facility]) CM/SW Contact:    Ewing Schlein, LCSW Phone Number: 10/21/2022, 2:08 PM  Transition of Care Department [facility]) has reviewed patient and no TOC needs have been identified at this time. We will continue to monitor patient advancement through interdisciplinary progression rounds. If new patient transition needs arise, please place a TOC consult.

## 2022-10-21 NOTE — Telephone Encounter (Signed)
Bleeding resolved today and abdomen only slightly tender He will be dc later today unless something changes  He needs a phone call 4/29 to check on him and if he is ok can route to me (I am off 4/29) and if there are issues contact DOD

## 2022-10-22 LAB — CEA: CEA: 1.5 ng/mL (ref 0.0–4.7)

## 2022-10-23 ENCOUNTER — Telehealth: Payer: Self-pay

## 2022-10-23 NOTE — Telephone Encounter (Signed)
Spoke with pt and he states he is doing very well, no problems at this time. Dr. Leone Payor notified.

## 2022-10-23 NOTE — Transitions of Care (Post Inpatient/ED Visit) (Unsigned)
   10/23/2022  Name: Brandon Ruiz MRN: [id number] DOB: 22-Oct-1975  Today's TOC FU Call Status: Today's TOC FU Call Status:: Unsuccessul Call (1st Attempt) Unsuccessful Call (1st Attempt) Date: 10/21/22  Attempted to reach the patient regarding the most recent Inpatient/ED visit.  Follow Up Plan: Additional outreach attempts will be made to reach the patient to complete the Transitions of Care (Post Inpatient/ED visit) call.   Signature Kandis Cocking, CMA (AAMA)  [facility] [phone]

## 2022-10-24 ENCOUNTER — Ambulatory Visit (HOSPITAL_COMMUNITY): Payer: Managed Care, Other (non HMO)

## 2022-10-24 NOTE — Telephone Encounter (Signed)
Good to hear I have ordered a CBC for next Monday - would like him to et that done (could be Tuesday)

## 2022-10-26 NOTE — Telephone Encounter (Signed)
Spoke with pt and he states he sees his PCP on Wednesday and CBC will be checked then. Dr. Leone Payor notified.

## 2022-10-29 ENCOUNTER — Other Ambulatory Visit: Payer: Self-pay | Admitting: Internal Medicine

## 2022-10-29 DIAGNOSIS — E1129 Type 2 diabetes mellitus with other diabetic kidney complication: Secondary | ICD-10-CM

## 2022-10-29 DIAGNOSIS — I10 Essential (primary) hypertension: Secondary | ICD-10-CM

## 2022-10-31 ENCOUNTER — Ambulatory Visit (HOSPITAL_COMMUNITY)
Admission: RE | Admit: 2022-10-31 | Discharge: 2022-10-31 | Disposition: A | Discharge status: Home / Self Care | Payer: Managed Care, Other (non HMO) | Source: Ambulatory Visit | Attending: Internal Medicine | Admitting: Internal Medicine

## 2022-10-31 ENCOUNTER — Other Ambulatory Visit: Payer: Self-pay | Admitting: Internal Medicine

## 2022-10-31 DIAGNOSIS — K6389 Other specified diseases of intestine: Secondary | ICD-10-CM | POA: Diagnosis not present

## 2022-10-31 DIAGNOSIS — E119 Type 2 diabetes mellitus without complications: Secondary | ICD-10-CM

## 2022-10-31 MED ORDER — IOHEXOL 350 MG/ML SOLN
50.0000 mL | Freq: Once | INTRAVENOUS | Status: AC | PRN
  Administered 2022-10-31: 50 mL via INTRAVENOUS

## 2022-10-31 NOTE — Telephone Encounter (Signed)
Pt has appt tomorrow 11/01/22.Marland KitchenRaechel Ruiz

## 2022-11-01 ENCOUNTER — Ambulatory Visit: Payer: Managed Care, Other (non HMO) | Admitting: Internal Medicine

## 2022-11-01 ENCOUNTER — Encounter: Payer: Self-pay | Admitting: Internal Medicine

## 2022-11-01 VITALS — BP 136/86 | HR 111 | Temp 98.2°F | Ht 76.0 in | Wt 244.0 lb

## 2022-11-01 DIAGNOSIS — E119 Type 2 diabetes mellitus without complications: Secondary | ICD-10-CM | POA: Diagnosis not present

## 2022-11-01 DIAGNOSIS — R809 Proteinuria, unspecified: Secondary | ICD-10-CM

## 2022-11-01 DIAGNOSIS — E1129 Type 2 diabetes mellitus with other diabetic kidney complication: Secondary | ICD-10-CM

## 2022-11-01 DIAGNOSIS — Z794 Long term (current) use of insulin: Secondary | ICD-10-CM | POA: Diagnosis not present

## 2022-11-01 DIAGNOSIS — E041 Nontoxic single thyroid nodule: Secondary | ICD-10-CM

## 2022-11-01 DIAGNOSIS — R000 Tachycardia, unspecified: Secondary | ICD-10-CM | POA: Insufficient documentation

## 2022-11-01 DIAGNOSIS — I10 Essential (primary) hypertension: Secondary | ICD-10-CM

## 2022-11-01 DIAGNOSIS — D539 Nutritional anemia, unspecified: Secondary | ICD-10-CM | POA: Insufficient documentation

## 2022-11-01 LAB — CBC WITH DIFFERENTIAL/PLATELET
Basophils Absolute: 0.1 10*3/uL (ref 0.0–0.1)
Basophils Relative: 1.1 % (ref 0.0–3.0)
Eosinophils Absolute: 0.3 10*3/uL (ref 0.0–0.7)
Eosinophils Relative: 2.7 % (ref 0.0–5.0)
HCT: 41.3 % (ref 39.0–52.0)
Hemoglobin: 14.1 g/dL (ref 13.0–17.0)
Lymphocytes Relative: 22.3 % (ref 12.0–46.0)
Lymphs Abs: 2.2 10*3/uL (ref 0.7–4.0)
MCHC: 34.3 g/dL (ref 30.0–36.0)
MCV: 88.6 fl (ref 78.0–100.0)
Monocytes Absolute: 0.7 10*3/uL (ref 0.1–1.0)
Monocytes Relative: 7.5 % (ref 3.0–12.0)
Neutro Abs: 6.5 10*3/uL (ref 1.4–7.7)
Neutrophils Relative %: 66.4 % (ref 43.0–77.0)
Platelets: 390 10*3/uL (ref 150.0–400.0)
RBC: 4.66 Mil/uL (ref 4.22–5.81)
RDW: 14.3 % (ref 11.5–15.5)
WBC: 9.9 10*3/uL (ref 4.0–10.5)

## 2022-11-01 LAB — IBC + FERRITIN
Ferritin: 27.2 ng/mL (ref 22.0–322.0)
Iron: 65 ug/dL (ref 42–165)
Saturation Ratios: 18 % — ABNORMAL LOW (ref 20.0–50.0)
TIBC: 361.2 ug/dL (ref 250.0–450.0)
Transferrin: 258 mg/dL (ref 212.0–360.0)

## 2022-11-01 LAB — HEMOGLOBIN A1C: Hgb A1c MFr Bld: 6.7 % — ABNORMAL HIGH (ref 4.6–6.5)

## 2022-11-01 MED ORDER — METOPROLOL SUCCINATE ER 25 MG PO TB24
25.0000 mg | ORAL_TABLET | Freq: Every day | ORAL | 1 refills | Status: DC
Start: 2022-11-01 — End: 2023-02-28

## 2022-11-01 MED ORDER — EMPAGLIFLOZIN 10 MG PO TABS
10.0000 mg | ORAL_TABLET | Freq: Every day | ORAL | 1 refills | Status: DC
Start: 2022-11-01 — End: 2023-01-25

## 2022-11-01 MED ORDER — BASAGLAR KWIKPEN 100 UNIT/ML ~~LOC~~ SOPN
30.0000 [IU] | PEN_INJECTOR | Freq: Every day | SUBCUTANEOUS | 0 refills | Status: DC
Start: 2022-11-01 — End: 2023-01-25

## 2022-11-01 NOTE — Progress Notes (Unsigned)
Subjective:  Patient ID: Brandon Ruiz, male    DOB: 05/05/1976  Age: 47 y.o. MRN: [id number]  CC: Diabetes, Anemia, and Hypertension   HPI Brandon Ruiz presents for f/up ---   He was recently admitted for lower GI bleed after a colonoscopy.  He tells me he has been diagnosed with colon cancer.  His CT scan did not show any metastatic disease but it did reveal a thyroid nodule.  He denies abdominal pain, nausea, vomiting, diarrhea, or bright red blood per rectum.  Outpatient Medications Prior to Visit  Medication Sig Dispense Refill   Continuous Blood Gluc Receiver (DEXCOM G7 RECEIVER) DEVI 1 Act by Does not apply route daily. 9 each 1   Continuous Blood Gluc Sensor (DEXCOM G7 SENSOR) MISC 1 Act by Does not apply route daily. 9 each 1   Dasiglucagon HCl (ZEGALOGUE) 0.6 MG/0.6ML SOAJ Inject 1 Act into the skin daily as needed. 0.6 mL 5   insulin lispro (HUMALOG) 100 UNIT/ML injection Inject 20 Units into the skin daily.     metFORMIN (GLUCOPHAGE) 1000 MG tablet Take 1 tablet by mouth twice daily with a meal 180 tablet 0   olmesartan (BENICAR) 20 MG tablet TAKE ONE TABLET BY MOUTH ONE TIME DAILY 90 tablet 0   rosuvastatin (CRESTOR) 10 MG tablet Take 1 tablet (10 mg total) by mouth daily. 90 tablet 1   Insulin Glargine (BASAGLAR KWIKPEN) 100 UNIT/ML Inject 30 Units into the skin at bedtime. (Patient not taking: Reported on 10/20/2022) 27 mL 0   No facility-administered medications prior to visit.    ROS Review of Systems  Constitutional:  Negative for appetite change, diaphoresis, fatigue and unexpected weight change.  HENT: Negative.    Eyes: Negative.   Respiratory:  Negative for cough, chest tightness, shortness of breath and wheezing.   Cardiovascular:  Negative for chest pain, palpitations and leg swelling.  Gastrointestinal:  Negative for abdominal pain, constipation, diarrhea, nausea and vomiting.  Endocrine: Negative.   Genitourinary: Negative.  Negative for difficulty urinating.   Musculoskeletal: Negative.  Negative for arthralgias and myalgias.  Skin: Negative.  Negative for color change.  Neurological:  Negative for dizziness and weakness.  Hematological:  Negative for adenopathy. Does not bruise/bleed easily.  Psychiatric/Behavioral: Negative.      Objective:  BP 136/86 (BP Location: Left Arm, Patient Position: Sitting, Cuff Size: Large)   Pulse (!) 111   Temp 98.2 F (36.8 C) (Oral)   Ht 6\' 4"  (1.93 m)   Wt 244 lb (110.7 kg)   SpO2 96%   BMI 29.70 kg/m   BP Readings from Last 3 Encounters:  11/01/22 136/86  10/21/22 120/68  10/19/22 (!) 150/86    Wt Readings from Last 3 Encounters:  11/01/22 244 lb (110.7 kg)  10/20/22 247 lb 9.2 oz (112.3 kg)  10/19/22 245 lb (111.1 kg)    Physical Exam Vitals reviewed.  Constitutional:      Appearance: Normal appearance.  HENT:     Nose: Nose normal.     Mouth/Throat:     Mouth: Mucous membranes are moist.  Eyes:     General: No scleral icterus.    Conjunctiva/sclera: Conjunctivae normal.  Neck:     Thyroid: No thyroid mass, thyromegaly or thyroid tenderness.  Cardiovascular:     Rate and Rhythm: Regular rhythm. Tachycardia present.     Heart sounds: Normal heart sounds, S1 normal and S2 normal. No murmur heard.    No gallop.  Pulmonary:     Effort:  Pulmonary effort is normal.     Breath sounds: No stridor. No wheezing, rhonchi or rales.  Abdominal:     General: Abdomen is protuberant. Bowel sounds are normal. There is no distension.     Palpations: There is no hepatomegaly, splenomegaly or mass.     Tenderness: There is no abdominal tenderness.  Musculoskeletal:        General: Normal range of motion.     Cervical back: Normal range of motion. No edema.     Right lower leg: No edema.     Left lower leg: No edema.  Lymphadenopathy:     Cervical: No cervical adenopathy.     Right cervical: No superficial cervical adenopathy.    Left cervical: No superficial cervical adenopathy.  Skin:     General: Skin is warm and dry.     Coloration: Skin is not pale.  Neurological:     General: No focal deficit present.     Mental Status: He is alert. Mental status is at baseline.  Psychiatric:        Mood and Affect: Mood normal.        Behavior: Behavior normal.     Lab Results  Component Value Date   WBC 9.9 11/01/2022   HGB 14.1 11/01/2022   HCT 41.3 11/01/2022   PLT 390.0 11/01/2022   GLUCOSE 129 (H) 10/20/2022   CHOL 164 08/03/2022   TRIG 389.0 (H) 08/03/2022   HDL 27.10 (L) 08/03/2022   LDLDIRECT 85.0 08/03/2022   ALT 14 10/20/2022   AST 12 (L) 10/20/2022   NA 136 10/20/2022   K 3.7 10/20/2022   CL 103 10/20/2022   CREATININE 0.78 10/20/2022   BUN 12 10/20/2022   CO2 25 10/20/2022   TSH 0.71 08/03/2022   PSA 0.93 08/03/2022   INR 1.1 10/19/2022   HGBA1C 6.7 (H) 11/01/2022   MICROALBUR 9.0 (H) 08/03/2022    CT CHEST ABDOMEN PELVIS W CONTRAST  Result Date: 10/31/2022 CLINICAL DATA:  Sigmoid mass, staging.  * Tracking Code: BO * EXAM: CT CHEST, ABDOMEN, AND PELVIS WITH CONTRAST TECHNIQUE: Multidetector CT imaging of the chest, abdomen and pelvis was performed following the standard protocol during bolus administration of intravenous contrast. RADIATION DOSE REDUCTION: This exam was performed according to the departmental dose-optimization program which includes automated exposure control, adjustment of the mA and/or kV according to patient size and/or use of iterative reconstruction technique. CONTRAST:  50mL OMNIPAQUE IOHEXOL 350 MG/ML SOLN COMPARISON:  CT November 01/12/2022 and October 19, 2022 FINDINGS: CT CHEST FINDINGS Cardiovascular: Normal caliber thoracic aorta. No central pulmonary embolus on this nondedicated study. Gas in the pulmonary outflow tract reflects sequela of vascular access. Normal size heart. No significant pericardial effusion/thickening. Mediastinum/Nodes: Nodular appearance of the left lobe of the thyroid measuring up to 18 mm. No pathologically  enlarged supraclavicular, mediastinal, hilar or axillary lymph nodes. Calcified right hilar lymph nodes. The esophagus is grossly unremarkable. Lungs/Pleura: Right upper lobe 14 x 12 mm nodule on image 76/5 demonstrates coarse internal calcifications. Tiny adjacent nodules some of which demonstrate internal calcifications measure up to 3 mm on image 78/5. No pleural effusion. No pneumothorax. Musculoskeletal: No aggressive lytic or blastic lesion of bone. Multilevel degenerative changes spine. CT ABDOMEN PELVIS FINDINGS Hepatobiliary: No suspicious hepatic lesion. Cholelithiasis without findings of acute cholecystitis. No biliary ductal dilation. Pancreas: No pancreatic ductal dilation or evidence of acute inflammation. Spleen: No splenomegaly. Adrenals/Urinary Tract: Bilateral adrenal glands appear normal. No hydronephrosis. Kidneys demonstrate symmetric enhancement. Urinary  bladder is unremarkable for degree of distension. Stomach/Bowel: Radiopaque enteric contrast material traverses distal loops of small bowel. Stomach is unremarkable for degree of distension. No pathologic dilation of small or large bowel. No evidence of acute bowel inflammation. Similar appearance of the mid sigmoid wall thickening on image 126/3. Vascular/Lymphatic: Normal caliber abdominal aorta. Smooth IVC contours. 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 No pathologically enlarged abdominal or pelvic lymph nodes. Reproductive: Prostate is unremarkable. Other: No significant abdominopelvic free fluid. Musculoskeletal: No aggressive lytic or blastic lesion of bone. Multilevel degenerative changes spine. Advanced degenerative change of the right-greater-than-left hips. Degenerative change of the bilateral SI joints with partial bony ankylosis. IMPRESSION: 1. Similar appearance of the sigmoid  wall thickening possibly reflecting known malignancy. 2. No convincing evidence of metastatic disease within the chest, abdomen or pelvis. 3. Calcified right hilar lymph nodes with a 14 mm right upper lobe which demonstrates coarse internal calcifications and additional tiny adjacent pulmonary nodules some of which have internal calcifications, is favored to reflect sequela of prior granulomatous disease. 4. Nodular appearance of the left lobe of the thyroid measuring up to 18 mm, suggest further evaluation with dedicated thyroid ultrasound. Electronically Signed   By: Maudry Mayhew M.D.   On: 10/31/2022 18:04    Assessment & Plan:   Deficiency anemia- His H&H and iron level are normal. -     IBC + Ferritin; Future -     CBC with Differential/Platelet; Future  Insulin-requiring or dependent type II diabetes mellitus (HCC)- His blood sugar is adequately well-controlled. -     Hemoglobin A1c; Future -     Therapist, nutritional; Inject 30 Units into the skin at bedtime.  Dispense: 27 mL; Refill: 0  Thyroid nodule -     US THYROID; Future  Tachycardia- Will start a beta-blocker. -     Metoprolol Succinate ER; Take 1 tablet (25 mg total) by mouth daily.  Dispense: 90 tablet; Refill: 1  Primary hypertension -     Metoprolol Succinate ER; Take 1 tablet (25 mg total) by mouth daily.  Dispense: 90 tablet; Refill: 1  Microalbuminuria due to type 2 diabetes mellitus (HCC) -     Empagliflozin; Take 1 tablet (10 mg total) by mouth daily before breakfast.  Dispense: 90 tablet; Refill: 1     Follow-up: Return in about 4 months (around 03/04/2023).  Sanda Linger, MD

## 2022-11-01 NOTE — Patient Instructions (Signed)

## 2022-11-07 ENCOUNTER — Other Ambulatory Visit: Payer: Self-pay | Admitting: Urology

## 2022-11-07 IMAGING — US US EXTREM LOW VENOUS*R*
1 series · 13 of 24 positions shown · non-contrast
Comparison: None.

CLINICAL DATA: Right leg swelling

EXAM:
Right LOWER EXTREMITY VENOUS DOPPLER ULTRASOUND
TECHNIQUE: Gray-scale sonography with compression, as well as color and duplex
ultrasound, were performed to evaluate the deep venous system(s)
from the level of the common femoral vein through the popliteal and
proximal calf veins.

[Series 1: us venous img lower uni right (dvt) · portal-venous · 13 of 57 slices shown]
[im 1/57]
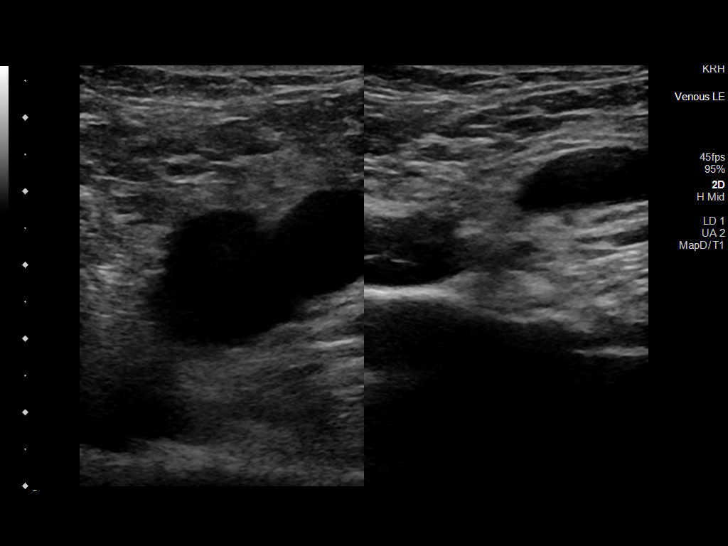
[im 5/57]
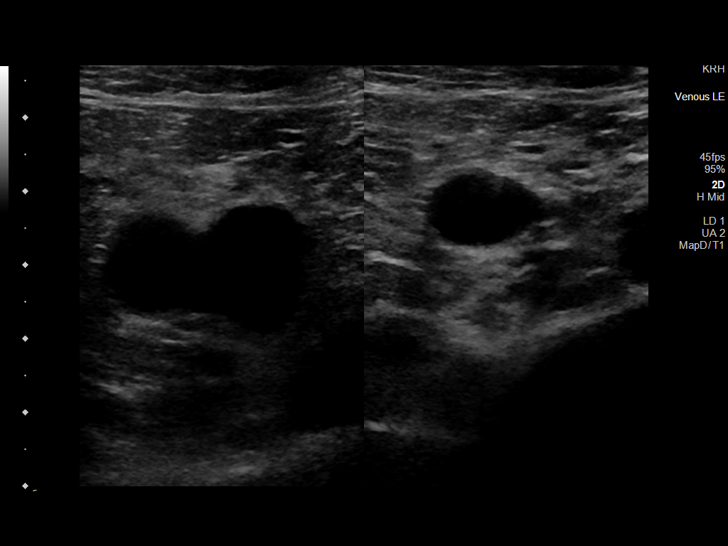
[im 10/57]
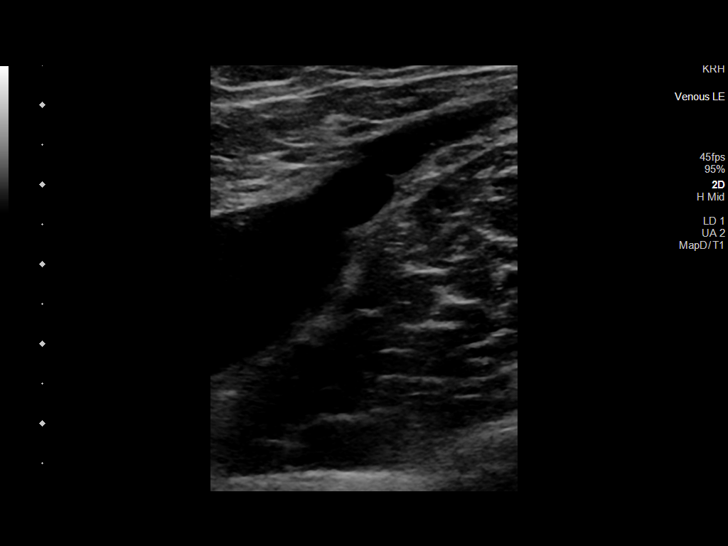
[im 15/57]
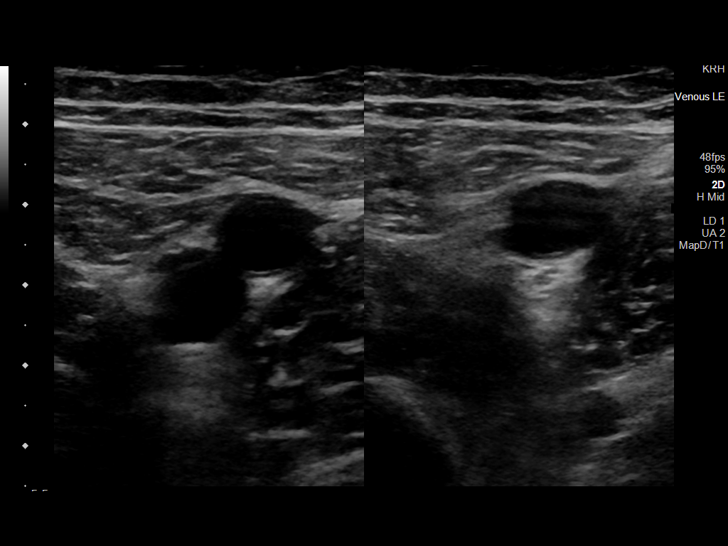
[im 20/57]
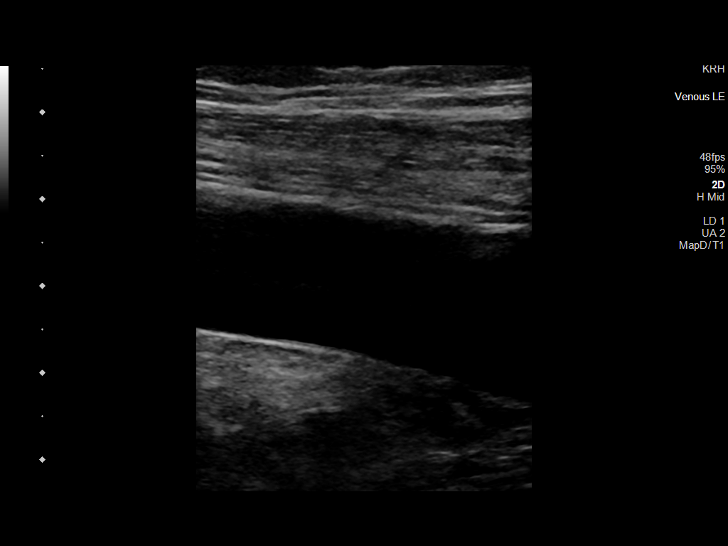
[im 25/57]
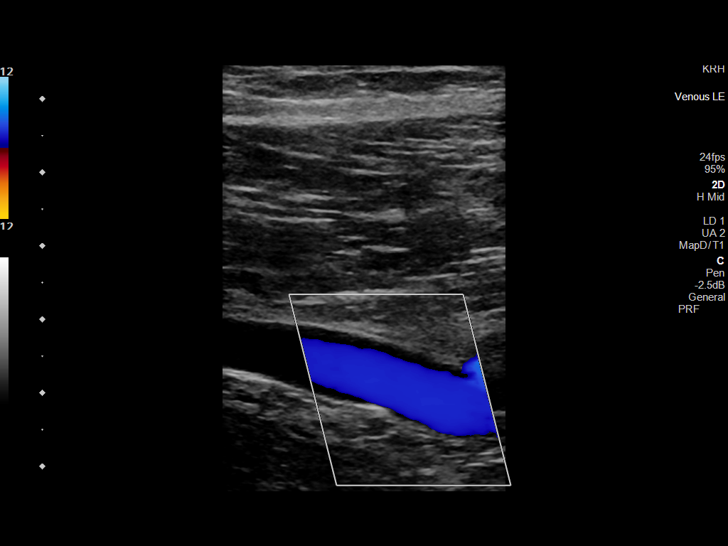
[im 30/57]
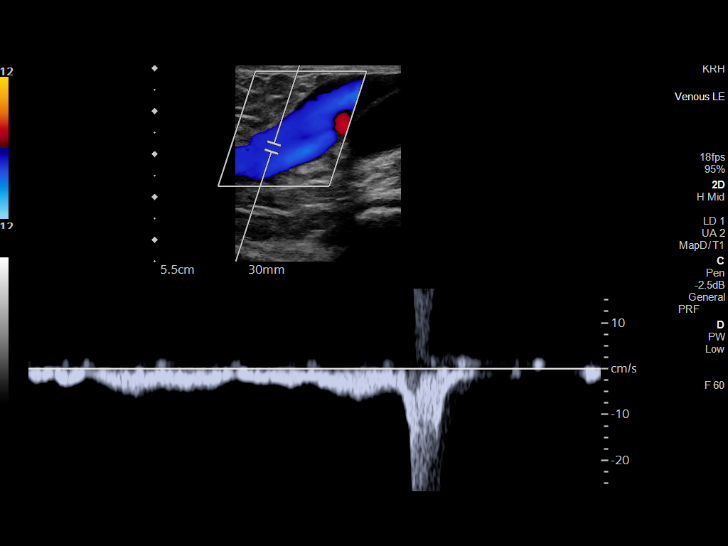
[im 32/57]
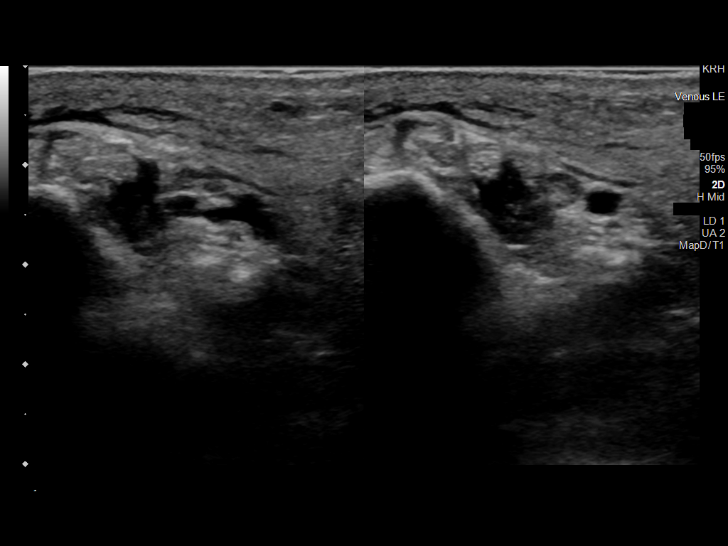
[im 37/57]
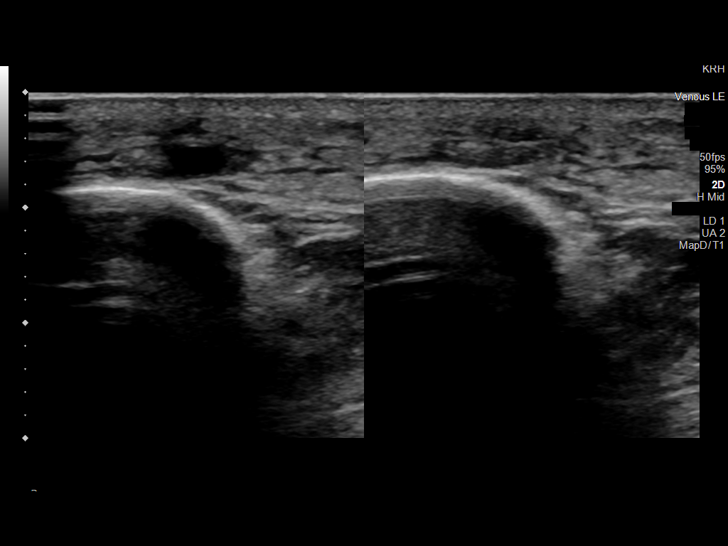
[im 42/57]
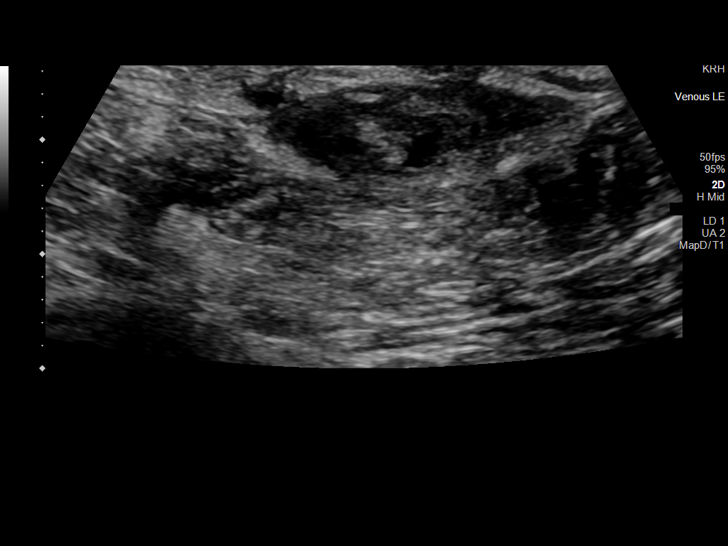
[im 47/57]
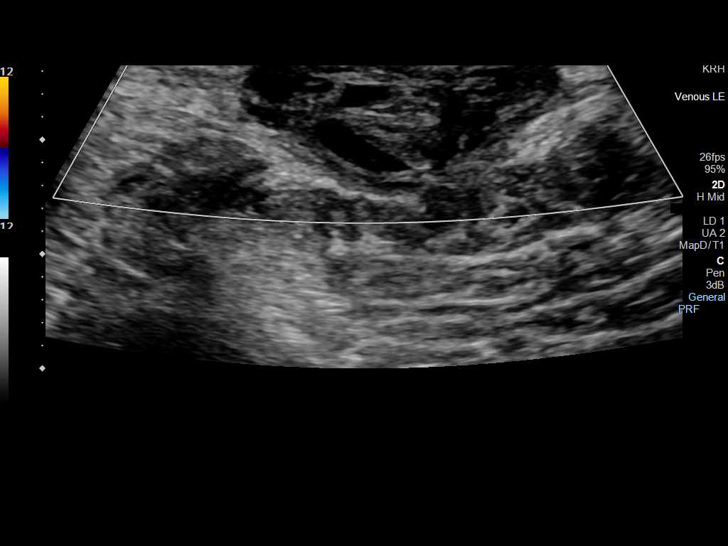
[im 52/57]
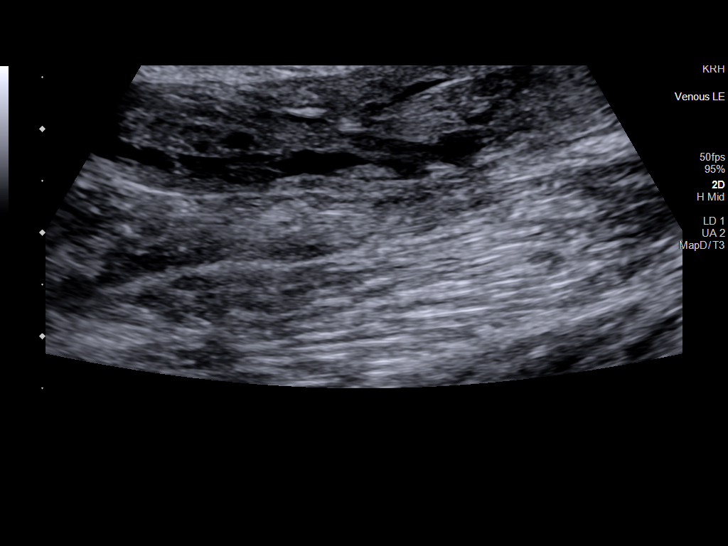
[im 57/57]
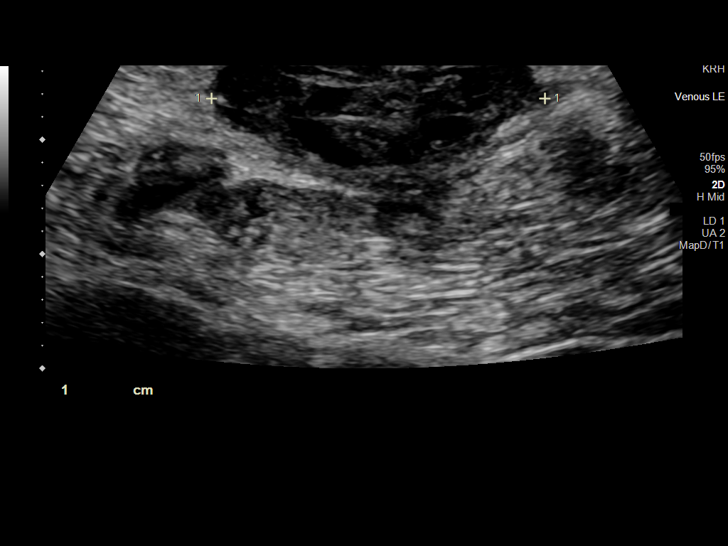

[13 of 24 positions shown; findings below may reference images not displayed]

FINDINGS: VENOUS

Normal compressibility of the common femoral, superficial femoral,
and popliteal veins, as well as the visualized calf veins.
Visualized portions of profunda femoral vein and great saphenous
vein unremarkable. No filling defects to suggest DVT on grayscale or
color Doppler imaging. Doppler waveforms show normal direction of
venous flow, normal respiratory plasticity and response to
augmentation.

Limited views of the contralateral common femoral vein are
unremarkable.

OTHER

In the region of interest at the posterolateral upper calf region,
there is a 5.7 x 1.3 x 2.9 cm heterogeneously hypoechoic
subcutaneous collection without internal or increased surrounding
Doppler flow.

Limitations: none
IMPRESSION: 1. No DVT identified.
2. Heterogeneous subcutaneous collection in the posterolateral upper
calf region of interest as described. Appearance is suggestive of a
hematoma, without definite evidence of abscess/infection, correlate
clinically.

## 2022-11-08 ENCOUNTER — Encounter: Payer: Self-pay | Admitting: Internal Medicine

## 2022-11-16 ENCOUNTER — Ambulatory Visit: Payer: Self-pay | Admitting: General Surgery

## 2022-11-16 DIAGNOSIS — K5720 Diverticulitis of large intestine with perforation and abscess without bleeding: Secondary | ICD-10-CM

## 2022-11-22 ENCOUNTER — Encounter (HOSPITAL_COMMUNITY): Payer: Self-pay

## 2022-11-22 NOTE — Patient Instructions (Addendum)
SURGICAL WAITING ROOM VISITATION Patients having surgery or a procedure may have no more than 2 support people in the waiting area - these visitors may rotate.    Children under the age of 59 must have an adult with them who is not the patient.  If the patient needs to stay at the hospital during part of their recovery, the visitor guidelines for inpatient rooms apply. Pre-op nurse will coordinate an appropriate time for 1 support person to accompany patient in pre-op.  This support person may not rotate.    Please refer to the [facility] website for the visitor guidelines for Inpatients (after your surgery is over and you are in a regular room).       Your procedure is scheduled on: 12-11-22   Report to [facility] Main Entrance    Report to admitting at 8:45 AM   Call this number if you have problems the morning of surgery [phone]   Follow a clear liquid diet the day of prep to prevent dehydration   After Midnight you may have the following liquids until 8:00 AM DAY OF SURGERY  Water Non-Citrus Juices (without pulp, NO RED-Apple, White grape, White cranberry) Black Coffee (NO MILK/CREAM OR CREAMERS, sugar ok)  Clear Tea (NO MILK/CREAM OR CREAMERS, sugar ok) regular and decaf                             Plain Jell-O (NO RED)                                           Fruit ices (not with fruit pulp, NO RED)                                     Popsicles (NO RED)                                                               Sports drinks like Gatorade (NO RED)  Drink 2 Pre-surgery G2 the evening before surgery                    The day of surgery:  Drink ONE (1) Pre-Surgery G2 at 8:00 AM the morning of surgery. Drink in one sitting. Do not sip.  This drink was given to you during your hospital  pre-op appointment visit. Nothing else to drink after completing the Pre-Surgery G2.          If you have questions, please contact your surgeon's office.   FOLLOW  BOWEL PREP AND ANY ADDITIONAL PRE OP INSTRUCTIONS YOU RECEIVED FROM YOUR SURGEON'S OFFICE!!!   -Clear liquids day of prep to prevent dehydration  - Bisacodyl (Dulcolax) 20 mg - Give with water the day prior to surgery.   - Miralax 255g - Mix with 64 oz Gatorade/Powerade.  Drink gradually over the next few hours (8 oz glass every 15-30 minutes) until gone the day prior to surgery.   -Metronidazole (Flagyl) 1000 mg - At 2 pm, 3 pm and 10 pm after Miralax bowel  prep the day prior to surgery.       -Neomycin 1000 mg - At 2 pm, 3 pm and 10 pm after Miralax  bowel prep the day prior to surgery.    Oral Hygiene is also important to reduce your risk of infection.                                    Remember - BRUSH YOUR TEETH THE MORNING OF SURGERY WITH YOUR REGULAR TOOTHPASTE   Do NOT smoke after Midnight   Take these medicines the morning of surgery with A SIP OF WATER:   Metoprolol  Rosuvastatin  How to Manage Your Diabetes Before and After Surgery  Why is it important to control my blood sugar before and after surgery? Improving blood sugar levels before and after surgery helps healing and can limit problems. A way of improving blood sugar control is eating a healthy diet by:  Eating less sugar and carbohydrates  Increasing activity/exercise  Talking with your doctor about reaching your blood sugar goals High blood sugars (greater than 180 mg/dL) can raise your risk of infections and slow your recovery, so you will need to focus on controlling your diabetes during the weeks before surgery. Make sure that the doctor who takes care of your diabetes knows about your planned surgery including the date and location.  How do I manage my blood sugar before surgery? Check your blood sugar at least 4 times a day, starting 2 days before surgery, to make sure that the level is not too high or low. Check your blood sugar the morning of your surgery when you wake up and every 2 hours until you get  to the Short Stay unit. If your blood sugar is less than 70 mg/dL, you will need to treat for low blood sugar: Do not take insulin. Treat a low blood sugar (less than 70 mg/dL) with  cup of clear juice (cranberry or apple), 4 glucose tablets, OR glucose gel. Recheck blood sugar in 15 minutes after treatment (to make sure it is greater than 70 mg/dL). If your blood sugar is not greater than 70 mg/dL on recheck, call [phone] for further instructions. Report your blood sugar to the short stay nurse when you get to Short Stay.  If you are admitted to the hospital after surgery: Your blood sugar will be checked by the staff and you will probably be given insulin after surgery (instead of oral diabetes medicines) to make sure you have good blood sugar levels. The goal for blood sugar control after surgery is 80-180 mg/dL.   WHAT DO I DO ABOUT MY DIABETES MEDICATION?  Do not take oral diabetes medicines (pills) the morning of surgery.  Hold Jardiance 3 days before surgery (do not take after 12-07-22)  THE MORNING OF SURGERY, take 10 units of Insulin Glargine.       DO NOT TAKE THE FOLLOWING 7 DAYS PRIOR TO SURGERY: Ozempic, Wegovy, Rybelsus (Semaglutide), Byetta (exenatide), Bydureon (exenatide ER), Victoza, Saxenda (liraglutide), or Trulicity (dulaglutide) Mounjaro (Tirzepatide) Adlyxin (Lixisenatide), Polyethylene Glycol Loxenatide.   Reviewed and Endorsed by [facility] Patient Education Committee, August 2015                              You may not have any metal on your body including  jewelry, and body piercing  Do not wear lotions, powders, cologne, or deodorant              Men may shave face and neck.   Do not bring valuables to the hospital. [facility] IS NOT RESPONSIBLE   FOR VALUABLES.   Contacts, dentures or bridgework may not be worn into surgery.   Bring small overnight bag day of surgery.   DO NOT BRING YOUR HOME MEDICATIONS TO THE HOSPITAL. PHARMACY  WILL DISPENSE MEDICATIONS LISTED ON YOUR MEDICATION LIST TO YOU DURING YOUR ADMISSION IN THE HOSPITAL!              Please read over the following fact sheets you were given: IF YOU HAVE QUESTIONS ABOUT YOUR PRE-OP INSTRUCTIONS PLEASE CALL [phone] [facility]  If you received a COVID test during your pre-op visit  it is requested that you wear a mask when out in public, stay away from anyone that may not be feeling well and notify your surgeon if you develop symptoms. If you test positive for Covid or have been in contact with anyone that has tested positive in the last 10 days please notify you surgeon.  [place] - Preparing for Surgery Before surgery, you can play an important role.  Because skin is not sterile, your skin needs to be as free of germs as possible.  You can reduce the number of germs on your skin by washing with CHG (chlorahexidine gluconate) soap before surgery.  CHG is an antiseptic cleaner which kills germs and bonds with the skin to continue killing germs even after washing. Please DO NOT use if you have an allergy to CHG or antibacterial soaps.  If your skin becomes reddened/irritated stop using the CHG and inform your nurse when you arrive at Short Stay. Do not shave (including legs and underarms) for at least 48 hours prior to the first CHG shower.  You may shave your face/neck.  Please follow these instructions carefully:  1.  Shower with CHG Soap the night before surgery and the  morning of surgery.  2.  If you choose to wash your hair, wash your hair first as usual with your normal  shampoo.  3.  After you shampoo, rinse your hair and body thoroughly to remove the shampoo.                             4.  Use CHG as you would any other liquid soap.  You can apply chg directly to the skin and wash.  Gently with a scrungie or clean washcloth.  5.  Apply the CHG Soap to your body ONLY FROM THE NECK DOWN.   Do   not use on face/ open                           Wound or open  sores. Avoid contact with eyes, ears mouth and   genitals (private parts).                       Wash face,  Genitals (private parts) with your normal soap.             6.  Wash thoroughly, paying special attention to the area where your    surgery  will be performed.  7.  Thoroughly rinse your body with warm water from the neck down.  8.  DO NOT shower/wash with  your normal soap after using and rinsing off the CHG Soap.                9.  Pat yourself dry with a clean towel.            10.  Wear clean pajamas.            11.  Place clean sheets on your bed the night of your first shower and do not  sleep with pets. Day of Surgery : Do not apply any lotions/deodorants the morning of surgery.  Please wear clean clothes to the hospital/surgery center.  FAILURE TO FOLLOW THESE INSTRUCTIONS MAY RESULT IN THE CANCELLATION OF YOUR SURGERY  PATIENT SIGNATURE_________________________________  NURSE SIGNATURE__________________________________  ________________________________________________________________________

## 2022-11-22 NOTE — Progress Notes (Addendum)
COVID Vaccine Completed:  No  Date of COVID positive in last 90 days:  No  PCP - Sanda Linger, MD Cardiologist - Leron Croak, MD (last OV 2020)  Chest x-ray - CT chest 10-31-22 Epic EKG - 08-03-22 Epic Stress Test - N/A ECHO - 09-17-18 [facility] (hard copy requested, cannot see results in [facility]) Cardiac Cath - N/A Pacemaker/ICD device last checked: Spinal Cord Stimulator:  N/A  Bowel Prep - Yes, pt has instructions and prescriptions  Sleep Study - N/A, states study has been recommended CPAP -   Fasting Blood Sugar -  150 range Checks Blood Sugar - 1 time a day  Last dose of GLP1 agonist-  N/A GLP1 instructions:  N/A   Jardiance Last dose of SGLT-2 inhibitors-  N/A SGLT-2 instructions: Hold 3 days before surgery, do  not take after 12-07-22  Blood Thinner Instructions:  Time Aspirin Instructions: Last Dose:  Activity level:  Can go up a flight of stairs and perform activities of daily living without stopping and without symptoms of chest pain or shortness of breath.   Able to exercise without symptoms  Anesthesia review: Chest pain evaluated by cardiology 2020, RBBB, HTN, DM  Patient denies shortness of breath, fever, cough and chest pain at PAT appointment  Patient verbalized understanding of instructions that were given to them at the PAT appointment. Patient was also instructed that they will need to review over the PAT instructions again at home before surgery.

## 2022-11-29 ENCOUNTER — Other Ambulatory Visit: Payer: Self-pay

## 2022-11-29 ENCOUNTER — Encounter (HOSPITAL_COMMUNITY)
Admission: RE | Admit: 2022-11-29 | Discharge: 2022-11-29 | Disposition: A | Discharge status: Home / Self Care | Payer: Managed Care, Other (non HMO) | Source: Ambulatory Visit | Attending: General Surgery | Admitting: General Surgery

## 2022-11-29 ENCOUNTER — Encounter (HOSPITAL_COMMUNITY): Payer: Self-pay

## 2022-11-29 ENCOUNTER — Ambulatory Visit
Admission: RE | Admit: 2022-11-29 | Discharge: 2022-11-29 | Disposition: A | Discharge status: Home / Self Care | Payer: Managed Care, Other (non HMO) | Source: Ambulatory Visit | Attending: Internal Medicine | Admitting: Internal Medicine

## 2022-11-29 VITALS — BP 149/97 | HR 87 | Temp 98.3°F | Resp 20 | Ht 76.0 in | Wt 245.2 lb

## 2022-11-29 DIAGNOSIS — C189 Malignant neoplasm of colon, unspecified: Secondary | ICD-10-CM | POA: Diagnosis present

## 2022-11-29 DIAGNOSIS — Z01812 Encounter for preprocedural laboratory examination: Secondary | ICD-10-CM | POA: Diagnosis present

## 2022-11-29 DIAGNOSIS — I10 Essential (primary) hypertension: Secondary | ICD-10-CM | POA: Insufficient documentation

## 2022-11-29 DIAGNOSIS — Z01818 Encounter for other preprocedural examination: Secondary | ICD-10-CM

## 2022-11-29 DIAGNOSIS — R918 Other nonspecific abnormal finding of lung field: Secondary | ICD-10-CM | POA: Insufficient documentation

## 2022-11-29 DIAGNOSIS — E041 Nontoxic single thyroid nodule: Secondary | ICD-10-CM

## 2022-11-29 DIAGNOSIS — E119 Type 2 diabetes mellitus without complications: Secondary | ICD-10-CM | POA: Insufficient documentation

## 2022-11-29 DIAGNOSIS — D649 Anemia, unspecified: Secondary | ICD-10-CM | POA: Insufficient documentation

## 2022-11-29 DIAGNOSIS — Z794 Long term (current) use of insulin: Secondary | ICD-10-CM | POA: Insufficient documentation

## 2022-11-29 HISTORY — DX: Anemia, unspecified: D64.9

## 2022-11-29 HISTORY — DX: Unspecified right bundle-branch block: I45.10

## 2022-11-29 LAB — BASIC METABOLIC PANEL
Anion gap: 10 (ref 5–15)
BUN: 15 mg/dL (ref 6–20)
CO2: 22 mmol/L (ref 22–32)
Calcium: 9.3 mg/dL (ref 8.9–10.3)
Chloride: 106 mmol/L (ref 98–111)
Creatinine, Ser: 0.73 mg/dL (ref 0.61–1.24)
GFR, Estimated: >60 mL/min (ref >60)
Glucose, Bld: 150 mg/dL — ABNORMAL HIGH (ref 70–99)
Potassium: 4.1 mmol/L (ref 3.5–5.1)
Sodium: 138 mmol/L (ref 135–145)

## 2022-11-29 LAB — CBC
HCT: 45.7 % (ref 39.0–52.0)
Hemoglobin: 15.1 g/dL (ref 13.0–17.0)
MCH: 29.7 pg (ref 26.0–34.0)
MCHC: 33 g/dL (ref 30.0–36.0)
MCV: 90 fL (ref 80.0–100.0)
Platelets: 284 10*3/uL (ref 150–400)
RBC: 5.08 MIL/uL (ref 4.22–5.81)
RDW: 13.2 % (ref 11.5–15.5)
WBC: 6.9 10*3/uL (ref 4.0–10.5)
nRBC: 0 % (ref 0.0–0.2)

## 2022-11-29 LAB — TYPE AND SCREEN
ABO/RH(D): B POS
Antibody Screen: NEGATIVE

## 2022-11-29 LAB — GLUCOSE, CAPILLARY: Glucose-Capillary: 161 mg/dL — ABNORMAL HIGH (ref 70–99)

## 2022-12-04 ENCOUNTER — Encounter (HOSPITAL_COMMUNITY): Payer: Self-pay

## 2022-12-04 ENCOUNTER — Ambulatory Visit: Payer: Self-pay | Admitting: General Surgery

## 2022-12-04 NOTE — Anesthesia Preprocedure Evaluation (Addendum)
Anesthesia Evaluation  Patient identified by MRN, date of birth, ID band Patient awake    Reviewed: Allergy & Precautions, NPO status , Patient's Chart, lab work & pertinent test results  Airway Mallampati: II  TM Distance: >3 FB Neck ROM: Full    Dental  (+) Teeth Intact, Dental Advisory Given, Caps   Pulmonary neg pulmonary ROS   breath sounds clear to auscultation       Cardiovascular hypertension, Pt. on medications + dysrhythmias  Rhythm:Regular Rate:Normal     Neuro/Psych negative neurological ROS     GI/Hepatic negative GI ROS, Neg liver ROS,,,  Endo/Other  diabetes, Poorly Controlled, Type 2    Renal/GU negative Renal ROS     Musculoskeletal   Abdominal   Peds  Hematology  (+) Blood dyscrasia, anemia   Anesthesia Other Findings   Reproductive/Obstetrics                             Anesthesia Physical Anesthesia Plan  ASA: 3  Anesthesia Plan: General   Post-op Pain Management: Dilaudid IV, Celebrex PO (pre-op)* and Tylenol PO (pre-op)*   Induction: Intravenous  PONV Risk Score and Plan: 2 and Dexamethasone, Ondansetron and Treatment may vary due to age or medical condition  Airway Management Planned: Oral ETT  Additional Equipment: None  Intra-op Plan:   Post-operative Plan: Extubation in OR  Informed Consent: I have reviewed the patients History and Physical, chart, labs and discussed the procedure including the risks, benefits and alternatives for the proposed anesthesia with the patient or authorized representative who has indicated his/her understanding and acceptance.     Dental advisory given  Plan Discussed with: CRNA and Anesthesiologist  Anesthesia Plan Comments: (See PAT note from 6/5 by Sherlie Ban PA-C  DISCUSSION: Brandon Ruiz is a 47 yo male who presents to PAT prior to surgery listed above. PMH significant for recent diagnosis of colon cancer, insulin  dependent DM, HTN, anemia.    Patient was admitted for rectal bleeding after he had a colonoscopy with biopsies from 10/19/22-10/21/22. He was observed and bleeding stabilized. He was referred to general surgery at discharge and is now set up for a sigmoidectomy. His anemia has resolved on recent labs.   Patient is followed by his PCP for chronic medical conditions which are relatively well controlled. BP mildly elevated at PAT visit. He was recently started on Metoprolol. Hg A1c was 6.7 on 11/01/22 labs.   Patient has remote w/u for chest pain in 2020 and saw Cardiology at that time. Chest pain was felt to be atypical. EKG showed NSR with RBBB. Echo was done on 09/17/2018 which was reportedly normal. )        Anesthesia Quick Evaluation

## 2022-12-04 NOTE — Progress Notes (Signed)
Case: [id number] Date/Time: 12/11/22 1045   Procedures:      XI ROBOT ASSISTED LAPAROSCOPIC PARTIAL COLECTOMY     CYSTOSCOPY with FIREFLY INJECTION   Anesthesia type: General   Pre-op diagnosis: COLON CANCER   Location: [facility] ROOM 05 / [facility]   Surgeons: Kinsinger, De Blanch, MD; Bjorn Pippin, MD       DISCUSSION: Brandon Ruiz is a 47 yo male who presents to PAT prior to surgery listed above. PMH significant for recent diagnosis of colon cancer, insulin dependent DM, HTN, anemia.   Patient was admitted for rectal bleeding after he had a colonoscopy with biopsies from 10/19/22-10/21/22. He was observed and bleeding stabilized. He was referred to general surgery at discharge and is now set up for a sigmoidectomy. His anemia has resolved on recent labs.  Patient is followed by his PCP for chronic medical conditions which are relatively well controlled. BP mildly elevated at PAT visit. He was recently started on Metoprolol. Hg A1c was 6.7 on 11/01/22 labs.  Patient has remote w/u for chest pain in 2020 and saw Cardiology at that time. Chest pain was felt to be atypical. EKG showed NSR with RBBB. Echo was done on 09/17/2018 which was reportedly normal.  VS: BP (!) 149/97   Pulse 87   Temp 36.8 C (Oral)   Resp 20   Ht 6\' 4"  (1.93 m)   Wt 111.2 kg   SpO2 97%   BMI 29.85 kg/m   PROVIDERS: Etta Grandchild, MD   LABS: Labs reviewed: Acceptable for surgery. (all labs ordered are listed, but only abnormal results are displayed)  Labs Reviewed  BASIC METABOLIC PANEL - Abnormal; Notable for the following components:      Result Value   Glucose, Bld 150 (*)    All other components within normal limits  GLUCOSE, CAPILLARY - Abnormal; Notable for the following components:   Glucose-Capillary 161 (*)    All other components within normal limits  CBC  TYPE AND SCREEN     IMAGES:  CT chest/abdomen/pelvis 10/31/22:  IMPRESSION: 1. Similar appearance of the sigmoid wall thickening  possibly reflecting known malignancy. 2. No convincing evidence of metastatic disease within the chest, abdomen or pelvis. 3. Calcified right hilar lymph nodes with a 14 mm right upper lobe which demonstrates coarse internal calcifications and additional tiny adjacent pulmonary nodules some of which have internal calcifications, is favored to reflect sequela of prior granulomatous disease. 4. Nodular appearance of the left lobe of the thyroid measuring up to 18 mm, suggest further evaluation with dedicated thyroid ultrasound.      EKG 08/03/22:  NSR RBBB   CV:   Echo 09/17/2018 - normal per report (copy of report is not available)  Past Medical History:  Diagnosis Date   Anemia    Diabetes mellitus without complication (HCC)    Hyperlipidemia    Hypertension    RBBB     Past Surgical History:  Procedure Laterality Date   APPENDECTOMY     COLONOSCOPY     INCISION AND DRAINAGE OF WOUND Right 05/22/2022   Procedure: IRRIGATION AND DEBRIDEMENT RIGHT GROIN;  Surgeon: Kinsinger, De Blanch, MD;  Location: [facility];  Service: General;  Laterality: Right;   TONSILLECTOMY      MEDICATIONS:  Continuous Blood Gluc Receiver (DEXCOM G7 RECEIVER) DEVI   Continuous Blood Gluc Sensor (DEXCOM G7 SENSOR) MISC   Dasiglucagon HCl (ZEGALOGUE) 0.6 MG/0.6ML SOAJ   empagliflozin (JARDIANCE) 10 MG TABS tablet   Insulin Glargine (  BASAGLAR KWIKPEN) 100 UNIT/ML   metFORMIN (GLUCOPHAGE) 1000 MG tablet   metoprolol succinate (TOPROL-XL) 25 MG 24 hr tablet   olmesartan (BENICAR) 20 MG tablet   rosuvastatin (CRESTOR) 10 MG tablet   No current facility-administered medications for this encounter.   Brandon Ruiz [facility] Short Stay/Anesthesiology [facility] Phone [phone] 12/04/2022 3:41 PM

## 2022-12-11 ENCOUNTER — Inpatient Hospital Stay (HOSPITAL_COMMUNITY): Payer: Managed Care, Other (non HMO) | Admitting: Medical

## 2022-12-11 ENCOUNTER — Inpatient Hospital Stay (HOSPITAL_COMMUNITY)
Admission: RE | Admit: 2022-12-11 | Discharge: 2022-12-14 | DRG: 330 | Disposition: A | Discharge status: Home / Self Care | Payer: Managed Care, Other (non HMO) | Attending: General Surgery | Admitting: General Surgery

## 2022-12-11 ENCOUNTER — Other Ambulatory Visit: Payer: Self-pay

## 2022-12-11 ENCOUNTER — Encounter (HOSPITAL_COMMUNITY): Payer: Self-pay | Admitting: General Surgery

## 2022-12-11 ENCOUNTER — Inpatient Hospital Stay (HOSPITAL_COMMUNITY): Payer: Managed Care, Other (non HMO) | Admitting: Anesthesiology

## 2022-12-11 ENCOUNTER — Encounter (HOSPITAL_COMMUNITY)
Admission: RE | Disposition: A | Discharge status: Home / Self Care | Payer: Self-pay | Source: Home / Self Care | Attending: General Surgery

## 2022-12-11 DIAGNOSIS — E785 Hyperlipidemia, unspecified: Secondary | ICD-10-CM | POA: Diagnosis present

## 2022-12-11 DIAGNOSIS — Z801 Family history of malignant neoplasm of trachea, bronchus and lung: Secondary | ICD-10-CM | POA: Diagnosis not present

## 2022-12-11 DIAGNOSIS — C772 Secondary and unspecified malignant neoplasm of intra-abdominal lymph nodes: Secondary | ICD-10-CM | POA: Diagnosis present

## 2022-12-11 DIAGNOSIS — K625 Hemorrhage of anus and rectum: Secondary | ICD-10-CM | POA: Diagnosis present

## 2022-12-11 DIAGNOSIS — I10 Essential (primary) hypertension: Secondary | ICD-10-CM | POA: Diagnosis present

## 2022-12-11 DIAGNOSIS — K5720 Diverticulitis of large intestine with perforation and abscess without bleeding: Secondary | ICD-10-CM

## 2022-12-11 DIAGNOSIS — C187 Malignant neoplasm of sigmoid colon: Principal | ICD-10-CM | POA: Diagnosis present

## 2022-12-11 DIAGNOSIS — E119 Type 2 diabetes mellitus without complications: Secondary | ICD-10-CM | POA: Diagnosis present

## 2022-12-11 DIAGNOSIS — Z880 Allergy status to penicillin: Secondary | ICD-10-CM | POA: Diagnosis not present

## 2022-12-11 DIAGNOSIS — K660 Peritoneal adhesions (postprocedural) (postinfection): Secondary | ICD-10-CM | POA: Diagnosis present

## 2022-12-11 DIAGNOSIS — Z833 Family history of diabetes mellitus: Secondary | ICD-10-CM

## 2022-12-11 DIAGNOSIS — Z7984 Long term (current) use of oral hypoglycemic drugs: Secondary | ICD-10-CM

## 2022-12-11 DIAGNOSIS — E1165 Type 2 diabetes mellitus with hyperglycemia: Secondary | ICD-10-CM

## 2022-12-11 DIAGNOSIS — Z79899 Other long term (current) drug therapy: Secondary | ICD-10-CM | POA: Diagnosis not present

## 2022-12-11 DIAGNOSIS — D630 Anemia in neoplastic disease: Secondary | ICD-10-CM | POA: Diagnosis not present

## 2022-12-11 DIAGNOSIS — Z794 Long term (current) use of insulin: Secondary | ICD-10-CM

## 2022-12-11 DIAGNOSIS — C189 Malignant neoplasm of colon, unspecified: Principal | ICD-10-CM | POA: Diagnosis present

## 2022-12-11 LAB — CREATININE, SERUM
Creatinine, Ser: 0.76 mg/dL (ref 0.61–1.24)
GFR, Estimated: >60 mL/min (ref >60)

## 2022-12-11 LAB — GLUCOSE, CAPILLARY
Glucose-Capillary: 156 mg/dL — ABNORMAL HIGH (ref 70–99)
Glucose-Capillary: 165 mg/dL — ABNORMAL HIGH (ref 70–99)
Glucose-Capillary: 169 mg/dL — ABNORMAL HIGH (ref 70–99)
Glucose-Capillary: 179 mg/dL — ABNORMAL HIGH (ref 70–99)

## 2022-12-11 LAB — HEMOGLOBIN A1C
Hgb A1c MFr Bld: 6.2 % — ABNORMAL HIGH (ref 4.8–5.6)
Mean Plasma Glucose: 131.24 mg/dL

## 2022-12-11 LAB — ABO/RH: ABO/RH(D): B POS

## 2022-12-11 SURGERY — COLECTOMY, PARTIAL, ROBOT-ASSISTED, LAPAROSCOPIC
Anesthesia: General

## 2022-12-11 MED ORDER — SIMETHICONE 80 MG PO CHEW
40.0000 mg | CHEWABLE_TABLET | Freq: Four times a day (QID) | ORAL | Status: DC | PRN
  Filled 2022-12-11 (×3): qty 1

## 2022-12-11 MED ORDER — HYDROMORPHONE HCL 2 MG/ML IJ SOLN
INTRAMUSCULAR | Status: AC
  Filled 2022-12-11: qty 1

## 2022-12-11 MED ORDER — FENTANYL CITRATE (PF) 100 MCG/2ML IJ SOLN
INTRAMUSCULAR | Status: AC
  Filled 2022-12-11: qty 2

## 2022-12-11 MED ORDER — OXYCODONE HCL 5 MG/5ML PO SOLN: 5.0000 mg | Freq: Once | ORAL | Status: DC | PRN

## 2022-12-11 MED ORDER — METOPROLOL SUCCINATE ER 25 MG PO TB24
25.0000 mg | ORAL_TABLET | Freq: Every day | ORAL | Status: DC
  Administered 2022-12-12 – 2022-12-14 (×3): 25 mg via ORAL
  Filled 2022-12-11 (×3): qty 1

## 2022-12-11 MED ORDER — ACETAMINOPHEN 325 MG PO TABS
650.0000 mg | ORAL_TABLET | Freq: Four times a day (QID) | ORAL | Status: DC | PRN
  Administered 2022-12-12 – 2022-12-14 (×4): 650 mg via ORAL
  Filled 2022-12-11 (×4): qty 2

## 2022-12-11 MED ORDER — HEPARIN SODIUM (PORCINE) 5000 UNIT/ML IJ SOLN
5000.0000 [IU] | Freq: Once | INTRAMUSCULAR | Status: AC
  Administered 2022-12-11: 5000 [IU] via SUBCUTANEOUS
  Filled 2022-12-11: qty 1

## 2022-12-11 MED ORDER — MEPERIDINE HCL 50 MG/ML IJ SOLN: 6.2500 mg | INTRAMUSCULAR | Status: DC | PRN

## 2022-12-11 MED ORDER — DEXMEDETOMIDINE HCL IN NACL 80 MCG/20ML IV SOLN
INTRAVENOUS | Status: DC | PRN
  Administered 2022-12-11 (×4): 4 ug via INTRAVENOUS

## 2022-12-11 MED ORDER — INSULIN ASPART 100 UNIT/ML IJ SOLN: 0.0000 [IU] | INTRAMUSCULAR | Status: DC | PRN

## 2022-12-11 MED ORDER — ONDANSETRON HCL 4 MG/2ML IJ SOLN
4.0000 mg | Freq: Four times a day (QID) | INTRAMUSCULAR | Status: DC | PRN
  Filled 2022-12-11 (×2): qty 2

## 2022-12-11 MED ORDER — CHLORHEXIDINE GLUCONATE 4 % EX SOLN: 1.0000 | Freq: Once | CUTANEOUS | Status: DC

## 2022-12-11 MED ORDER — SUGAMMADEX SODIUM 200 MG/2ML IV SOLN
INTRAVENOUS | Status: DC | PRN
  Administered 2022-12-11: 200 mg via INTRAVENOUS

## 2022-12-11 MED ORDER — DIPHENHYDRAMINE HCL 50 MG/ML IJ SOLN: 25.0000 mg | Freq: Four times a day (QID) | INTRAMUSCULAR | Status: DC | PRN

## 2022-12-11 MED ORDER — MIDAZOLAM HCL 5 MG/5ML IJ SOLN
INTRAMUSCULAR | Status: DC | PRN
  Administered 2022-12-11: 2 mg via INTRAVENOUS

## 2022-12-11 MED ORDER — ONDANSETRON HCL 4 MG/2ML IJ SOLN
INTRAMUSCULAR | Status: DC | PRN
  Administered 2022-12-11: 4 mg via INTRAVENOUS

## 2022-12-11 MED ORDER — HYDROMORPHONE HCL 1 MG/ML IJ SOLN
INTRAMUSCULAR | Status: DC | PRN
  Administered 2022-12-11: 1 mg via INTRAVENOUS
  Administered 2022-12-11 (×2): .5 mg via INTRAVENOUS

## 2022-12-11 MED ORDER — BUPIVACAINE HCL (PF) 0.25 % IJ SOLN
INTRAMUSCULAR | Status: DC | PRN
  Administered 2022-12-11: 30 mL

## 2022-12-11 MED ORDER — ACETAMINOPHEN 325 MG PO TABS: 325.0000 mg | ORAL_TABLET | ORAL | Status: DC | PRN

## 2022-12-11 MED ORDER — RINGERS IRRIGATION IR SOLN
Status: DC | PRN
  Administered 2022-12-11: 1000 mL

## 2022-12-11 MED ORDER — OXYCODONE HCL 5 MG PO TABS
5.0000 mg | ORAL_TABLET | Freq: Four times a day (QID) | ORAL | Status: DC | PRN
  Administered 2022-12-11 – 2022-12-14 (×8): 5 mg via ORAL
  Filled 2022-12-11 (×9): qty 1

## 2022-12-11 MED ORDER — LIDOCAINE 2% (20 MG/ML) 5 ML SYRINGE
INTRAMUSCULAR | Status: DC | PRN
  Administered 2022-12-11: 80 mg via INTRAVENOUS

## 2022-12-11 MED ORDER — STERILE WATER FOR INJECTION IJ SOLN
INTRAMUSCULAR | Status: AC
  Filled 2022-12-11: qty 10

## 2022-12-11 MED ORDER — PROPOFOL 10 MG/ML IV BOLUS
INTRAVENOUS | Status: AC
  Filled 2022-12-11: qty 20

## 2022-12-11 MED ORDER — IBUPROFEN 200 MG PO TABS
600.0000 mg | ORAL_TABLET | Freq: Four times a day (QID) | ORAL | Status: DC | PRN
  Administered 2022-12-14: 600 mg via ORAL
  Filled 2022-12-11: qty 3

## 2022-12-11 MED ORDER — ONDANSETRON HCL 4 MG/2ML IJ SOLN
INTRAMUSCULAR | Status: AC
  Filled 2022-12-11: qty 2

## 2022-12-11 MED ORDER — DIPHENHYDRAMINE HCL 25 MG PO CAPS: 25.0000 mg | ORAL_CAPSULE | Freq: Four times a day (QID) | ORAL | Status: DC | PRN

## 2022-12-11 MED ORDER — FENTANYL CITRATE PF 50 MCG/ML IJ SOSY: 25.0000 ug | PREFILLED_SYRINGE | INTRAMUSCULAR | Status: DC | PRN

## 2022-12-11 MED ORDER — KETAMINE HCL 50 MG/5ML IJ SOSY
PREFILLED_SYRINGE | INTRAMUSCULAR | Status: AC
  Filled 2022-12-11: qty 5

## 2022-12-11 MED ORDER — DEXAMETHASONE SODIUM PHOSPHATE 10 MG/ML IJ SOLN
INTRAMUSCULAR | Status: AC
  Filled 2022-12-11: qty 1

## 2022-12-11 MED ORDER — ENSURE SURGERY PO LIQD: 237.0000 mL | Freq: Two times a day (BID) | ORAL | Status: DC

## 2022-12-11 MED ORDER — METRONIDAZOLE 500 MG PO TABS: 1000.0000 mg | ORAL_TABLET | ORAL | Status: DC

## 2022-12-11 MED ORDER — ONDANSETRON HCL 4 MG/2ML IJ SOLN: 4.0000 mg | Freq: Once | INTRAMUSCULAR | Status: DC | PRN

## 2022-12-11 MED ORDER — ALVIMOPAN 12 MG PO CAPS
12.0000 mg | ORAL_CAPSULE | Freq: Two times a day (BID) | ORAL | Status: DC
  Filled 2022-12-11: qty 1

## 2022-12-11 MED ORDER — SODIUM CHLORIDE 0.9 % IV SOLN
2.0000 g | INTRAVENOUS | Status: AC
  Administered 2022-12-11: 2 g via INTRAVENOUS
  Filled 2022-12-11: qty 2

## 2022-12-11 MED ORDER — ENSURE PRE-SURGERY PO LIQD
296.0000 mL | Freq: Once | ORAL | Status: DC
  Filled 2022-12-11: qty 296

## 2022-12-11 MED ORDER — IRBESARTAN 150 MG PO TABS
150.0000 mg | ORAL_TABLET | Freq: Every day | ORAL | Status: DC
  Administered 2022-12-12 – 2022-12-14 (×3): 150 mg via ORAL
  Filled 2022-12-11 (×3): qty 1

## 2022-12-11 MED ORDER — LIDOCAINE 20MG/ML (2%) 15 ML SYRINGE OPTIME
INTRAMUSCULAR | Status: DC | PRN
  Administered 2022-12-11: 1.5 mg/kg/h via INTRAVENOUS

## 2022-12-11 MED ORDER — ORAL CARE MOUTH RINSE: 15.0000 mL | Freq: Once | OROMUCOSAL | Status: AC

## 2022-12-11 MED ORDER — ROCURONIUM BROMIDE 10 MG/ML (PF) SYRINGE
PREFILLED_SYRINGE | INTRAVENOUS | Status: DC | PRN
  Administered 2022-12-11 (×2): 20 mg via INTRAVENOUS
  Administered 2022-12-11: 10 mg via INTRAVENOUS
  Administered 2022-12-11: 80 mg via INTRAVENOUS
  Administered 2022-12-11: 10 mg via INTRAVENOUS

## 2022-12-11 MED ORDER — OXYCODONE HCL 5 MG PO TABS: 5.0000 mg | ORAL_TABLET | Freq: Once | ORAL | Status: DC | PRN

## 2022-12-11 MED ORDER — METOPROLOL TARTRATE 5 MG/5ML IV SOLN: 5.0000 mg | Freq: Four times a day (QID) | INTRAVENOUS | Status: DC | PRN

## 2022-12-11 MED ORDER — ENSURE PRE-SURGERY PO LIQD
592.0000 mL | Freq: Once | ORAL | Status: DC
  Filled 2022-12-11: qty 592

## 2022-12-11 MED ORDER — ENOXAPARIN SODIUM 40 MG/0.4ML IJ SOSY
40.0000 mg | PREFILLED_SYRINGE | INTRAMUSCULAR | Status: DC
  Administered 2022-12-12 – 2022-12-14 (×3): 40 mg via SUBCUTANEOUS
  Filled 2022-12-11 (×3): qty 0.4

## 2022-12-11 MED ORDER — ROCURONIUM BROMIDE 10 MG/ML (PF) SYRINGE
PREFILLED_SYRINGE | INTRAVENOUS | Status: AC
  Filled 2022-12-11: qty 10

## 2022-12-11 MED ORDER — STERILE WATER FOR INJECTION IJ SOLN
INTRAMUSCULAR | Status: DC | PRN
  Administered 2022-12-11: 15 mL via INTRAVESICAL

## 2022-12-11 MED ORDER — SODIUM CHLORIDE 0.45 % IV SOLN: INTRAVENOUS | Status: DC

## 2022-12-11 MED ORDER — ONDANSETRON HCL 4 MG PO TABS: 4.0000 mg | ORAL_TABLET | Freq: Four times a day (QID) | ORAL | Status: DC | PRN

## 2022-12-11 MED ORDER — NEOMYCIN SULFATE 500 MG PO TABS: 1000.0000 mg | ORAL_TABLET | ORAL | Status: DC

## 2022-12-11 MED ORDER — LACTATED RINGERS IV SOLN: INTRAVENOUS | Status: DC

## 2022-12-11 MED ORDER — ALVIMOPAN 12 MG PO CAPS
12.0000 mg | ORAL_CAPSULE | ORAL | Status: AC
  Administered 2022-12-11: 12 mg via ORAL
  Filled 2022-12-11: qty 1

## 2022-12-11 MED ORDER — CHLORHEXIDINE GLUCONATE 0.12 % MT SOLN
15.0000 mL | Freq: Once | OROMUCOSAL | Status: AC
  Administered 2022-12-11: 15 mL via OROMUCOSAL

## 2022-12-11 MED ORDER — FENTANYL CITRATE (PF) 100 MCG/2ML IJ SOLN
INTRAMUSCULAR | Status: DC | PRN
  Administered 2022-12-11: 100 ug via INTRAVENOUS
  Administered 2022-12-11: 50 ug via INTRAVENOUS

## 2022-12-11 MED ORDER — DEXAMETHASONE SODIUM PHOSPHATE 10 MG/ML IJ SOLN
INTRAMUSCULAR | Status: DC | PRN
  Administered 2022-12-11: 10 mg via INTRAVENOUS

## 2022-12-11 MED ORDER — KETAMINE HCL 10 MG/ML IJ SOLN
INTRAMUSCULAR | Status: DC | PRN
  Administered 2022-12-11: 20 mg via INTRAVENOUS
  Administered 2022-12-11: 10 mg via INTRAVENOUS
  Administered 2022-12-11: 20 mg via INTRAVENOUS

## 2022-12-11 MED ORDER — INSULIN ASPART 100 UNIT/ML IJ SOLN
0.0000 [IU] | Freq: Three times a day (TID) | INTRAMUSCULAR | Status: DC
  Administered 2022-12-11: 3 [IU] via SUBCUTANEOUS
  Administered 2022-12-12 (×3): 2 [IU] via SUBCUTANEOUS
  Administered 2022-12-13: 3 [IU] via SUBCUTANEOUS
  Administered 2022-12-13 – 2022-12-14 (×2): 2 [IU] via SUBCUTANEOUS

## 2022-12-11 MED ORDER — LIDOCAINE HCL 2 % IJ SOLN
INTRAMUSCULAR | Status: AC
  Filled 2022-12-11: qty 20

## 2022-12-11 MED ORDER — MIDAZOLAM HCL 2 MG/2ML IJ SOLN
INTRAMUSCULAR | Status: AC
  Filled 2022-12-11: qty 2

## 2022-12-11 MED ORDER — BUPIVACAINE LIPOSOME 1.3 % IJ SUSP
INTRAMUSCULAR | Status: DC | PRN
  Administered 2022-12-11: 20 mL

## 2022-12-11 MED ORDER — ACETAMINOPHEN 160 MG/5ML PO SOLN: 325.0000 mg | ORAL | Status: DC | PRN

## 2022-12-11 MED ORDER — SODIUM CHLORIDE 0.9 % IR SOLN
Status: DC | PRN
  Administered 2022-12-11: 1000 mL via INTRAVESICAL

## 2022-12-11 MED ORDER — BUPIVACAINE LIPOSOME 1.3 % IJ SUSP: 20.0000 mL | Freq: Once | INTRAMUSCULAR | Status: DC

## 2022-12-11 MED ORDER — ACETAMINOPHEN 500 MG PO TABS
1000.0000 mg | ORAL_TABLET | ORAL | Status: AC
  Administered 2022-12-11: 1000 mg via ORAL
  Filled 2022-12-11: qty 2

## 2022-12-11 MED ORDER — INSULIN GLARGINE-YFGN 100 UNIT/ML ~~LOC~~ SOLN
10.0000 [IU] | Freq: Every day | SUBCUTANEOUS | Status: DC
  Administered 2022-12-11 – 2022-12-13 (×3): 10 [IU] via SUBCUTANEOUS
  Filled 2022-12-11 (×3): qty 0.1

## 2022-12-11 MED ORDER — 0.9 % SODIUM CHLORIDE (POUR BTL) OPTIME
TOPICAL | Status: DC | PRN
  Administered 2022-12-11: 2000 mL

## 2022-12-11 MED ORDER — PROPOFOL 10 MG/ML IV BOLUS
INTRAVENOUS | Status: DC | PRN
  Administered 2022-12-11: 200 mg via INTRAVENOUS

## 2022-12-11 MED ORDER — MORPHINE SULFATE (PF) 2 MG/ML IV SOLN
2.0000 mg | INTRAVENOUS | Status: DC | PRN
  Administered 2022-12-11 – 2022-12-12 (×3): 2 mg via INTRAVENOUS
  Filled 2022-12-11 (×3): qty 1

## 2022-12-11 MED ORDER — SUCCINYLCHOLINE CHLORIDE 200 MG/10ML IV SOSY
PREFILLED_SYRINGE | INTRAVENOUS | Status: AC
  Filled 2022-12-11: qty 10

## 2022-12-11 MED ORDER — BUPIVACAINE HCL 0.25 % IJ SOLN
INTRAMUSCULAR | Status: AC
  Filled 2022-12-11: qty 1

## 2022-12-11 MED ORDER — GABAPENTIN 300 MG PO CAPS
300.0000 mg | ORAL_CAPSULE | ORAL | Status: AC
  Administered 2022-12-11: 300 mg via ORAL
  Filled 2022-12-11: qty 1

## 2022-12-11 SURGICAL SUPPLY — 100 items
ADAPTER GOLDBERG URETERAL (ADAPTER) IMPLANT
ADH SKN CLS APL DERMABOND .7 (GAUZE/BANDAGES/DRESSINGS) ×1
ADPR CATH 15X14FR FL DRN BG (ADAPTER)
BAG COUNTER SPONGE SURGICOUNT (BAG) ×1 IMPLANT
BAG SPNG CNTER NS LX DISP (BAG) ×1
BAG URO CATCHER STRL LF (MISCELLANEOUS) ×1 IMPLANT
BLADE EXTENDED COATED 6.5IN (ELECTRODE) IMPLANT
CANNULA REDUCER 12-8 DVNC XI (CANNULA) IMPLANT
CATH URETL OPEN 5X70 (CATHETERS) IMPLANT
CELLS DAT CNTRL 66122 CELL SVR (MISCELLANEOUS) IMPLANT
CLOTH BEACON ORANGE TIMEOUT ST (SAFETY) ×1 IMPLANT
COVER SURGICAL LIGHT HANDLE (MISCELLANEOUS) ×2 IMPLANT
COVER TIP SHEARS 8 DVNC (MISCELLANEOUS) ×1 IMPLANT
DERMABOND ADVANCED .7 DNX12 (GAUZE/BANDAGES/DRESSINGS) IMPLANT
DRAIN CHANNEL 19F RND (DRAIN) IMPLANT
DRAPE ARM DVNC X/XI (DISPOSABLE) ×4 IMPLANT
DRAPE COLUMN DVNC XI (DISPOSABLE) ×1 IMPLANT
DRAPE SURG IRRIG POUCH 19X23 (DRAPES) ×1 IMPLANT
DRIVER NDL LRG 8 DVNC XI (INSTRUMENTS) ×1 IMPLANT
DRIVER NDLE LRG 8 DVNC XI (INSTRUMENTS) ×1 IMPLANT
DRSG OPSITE POSTOP 4X10 (GAUZE/BANDAGES/DRESSINGS) IMPLANT
DRSG OPSITE POSTOP 4X6 (GAUZE/BANDAGES/DRESSINGS) IMPLANT
DRSG OPSITE POSTOP 4X8 (GAUZE/BANDAGES/DRESSINGS) IMPLANT
ELECT PENCIL ROCKER SW 15FT (MISCELLANEOUS) ×1 IMPLANT
ELECT REM PT RETURN 15FT ADLT (MISCELLANEOUS) ×1 IMPLANT
ENDOLOOP SUT PDS II 0 18 (SUTURE) IMPLANT
EVACUATOR SILICONE 100CC (DRAIN) IMPLANT
GLOVE BIO SURGEON STRL SZ 6.5 (GLOVE) ×3 IMPLANT
GLOVE BIOGEL PI IND STRL 7.0 (GLOVE) ×2 IMPLANT
GLOVE INDICATOR 6.5 STRL GRN (GLOVE) ×1 IMPLANT
GLOVE SURG LX STRL 7.5 STRW (GLOVE) ×1 IMPLANT
GOWN SRG XL LVL 4 BRTHBL STRL (GOWNS) ×1 IMPLANT
GOWN STRL NON-REIN XL LVL4 (GOWNS) ×1
GOWN STRL REUS W/ TWL XL LVL3 (GOWN DISPOSABLE) ×3 IMPLANT
GOWN STRL REUS W/TWL XL LVL3 (GOWN DISPOSABLE) ×3
GRASPER SUT TROCAR 14GX15 (MISCELLANEOUS) IMPLANT
GRASPER TIP-UP FEN DVNC XI (INSTRUMENTS) ×1 IMPLANT
GUIDEWIRE ANG ZIPWIRE 038X150 (WIRE) IMPLANT
GUIDEWIRE STR DUAL SENSOR (WIRE) IMPLANT
HOLDER FOLEY CATH W/STRAP (MISCELLANEOUS) ×1 IMPLANT
IRRIG SUCT STRYKERFLOW 2 WTIP (MISCELLANEOUS) ×1
IRRIGATION SUCT STRKRFLW 2 WTP (MISCELLANEOUS) ×1 IMPLANT
KIT PROCEDURE DVNC SI (MISCELLANEOUS) ×1 IMPLANT
KIT TURNOVER KIT A (KITS) IMPLANT
MANIFOLD NEPTUNE II (INSTRUMENTS) ×1 IMPLANT
NDL INSUFFLATION 14GA 120MM (NEEDLE) ×1 IMPLANT
NEEDLE INSUFFLATION 14GA 120MM (NEEDLE) ×1 IMPLANT
PACK CARDIOVASCULAR III (CUSTOM PROCEDURE TRAY) ×1 IMPLANT
PACK COLON (CUSTOM PROCEDURE TRAY) ×1 IMPLANT
PACK CYSTO (CUSTOM PROCEDURE TRAY) ×1 IMPLANT
PAD POSITIONING PINK XL (MISCELLANEOUS) ×1 IMPLANT
RELOAD STAPLE 60 3.5 BLU DVNC (STAPLE) IMPLANT
RELOAD STAPLE 60 4.3 GRN DVNC (STAPLE) IMPLANT
RELOAD STAPLER 3.5X60 BLU DVNC (STAPLE) ×1 IMPLANT
RELOAD STAPLER 4.3X60 GRN DVNC (STAPLE) IMPLANT
RETRACTOR WND ALEXIS 18 MED (MISCELLANEOUS) IMPLANT
RTRCTR WOUND ALEXIS 18CM MED (MISCELLANEOUS)
SCISSORS LAP 5X35 DISP (ENDOMECHANICALS) IMPLANT
SCISSORS MNPLR CVD DVNC XI (INSTRUMENTS) ×1 IMPLANT
SEAL UNIV 5-12 XI (MISCELLANEOUS) ×3 IMPLANT
SEALER VESSEL EXT DVNC XI (MISCELLANEOUS) ×1 IMPLANT
SOL ELECTROSURG ANTI STICK (MISCELLANEOUS) ×1
SOLUTION ELECTROSURG ANTI STCK (MISCELLANEOUS) ×1 IMPLANT
SPIKE FLUID TRANSFER (MISCELLANEOUS) IMPLANT
STAPLER 60 SUREFORM DVNC (STAPLE) IMPLANT
STAPLER ECHELON POWER CIR 29 (STAPLE) IMPLANT
STAPLER ECHELON POWER CIR 31 (STAPLE) IMPLANT
STAPLER RELOAD 3.5X60 BLU DVNC (STAPLE) ×1
STAPLER RELOAD 4.3X60 GRN DVNC (STAPLE)
STOPCOCK 4 WAY LG BORE MALE ST (IV SETS) ×2 IMPLANT
SUT ETHILON 2 0 PS N (SUTURE) IMPLANT
SUT MNCRL AB 4-0 PS2 18 (SUTURE) IMPLANT
SUT NOVA NAB GS-21 1 T12 (SUTURE) ×2 IMPLANT
SUT PROLENE 2 0 KS (SUTURE) IMPLANT
SUT SILK 2 0 (SUTURE) ×1
SUT SILK 2 0 SH CR/8 (SUTURE) IMPLANT
SUT SILK 2-0 18XBRD TIE 12 (SUTURE) ×1 IMPLANT
SUT SILK 3 0 (SUTURE)
SUT SILK 3 0 SH CR/8 (SUTURE) ×1 IMPLANT
SUT SILK 3-0 18XBRD TIE 12 (SUTURE) IMPLANT
SUT V-LOC BARB 180 2/0GR6 GS22 (SUTURE)
SUT VIC AB 2-0 SH 18 (SUTURE) IMPLANT
SUT VIC AB 2-0 SH 27 (SUTURE)
SUT VIC AB 2-0 SH 27X BRD (SUTURE) IMPLANT
SUT VIC AB 3-0 SH 18 (SUTURE) IMPLANT
SUT VIC AB 4-0 PS2 27 (SUTURE) ×2 IMPLANT
SUT VICRYL 0 UR6 27IN ABS (SUTURE) ×1 IMPLANT
SUTURE V-LC BRB 180 2/0GR6GS22 (SUTURE) IMPLANT
SYR 20ML ECCENTRIC (SYRINGE) ×1 IMPLANT
SYS LAPSCP GELPORT 120MM (MISCELLANEOUS)
SYS WOUND ALEXIS 18CM MED (MISCELLANEOUS)
SYSTEM LAPSCP GELPORT 120MM (MISCELLANEOUS) IMPLANT
SYSTEM WOUND ALEXIS 18CM MED (MISCELLANEOUS) IMPLANT
TOWEL OR 17X26 10 PK STRL BLUE (TOWEL DISPOSABLE) IMPLANT
TOWEL OR NON WOVEN STRL DISP B (DISPOSABLE) ×1 IMPLANT
TRAY FOLEY MTR SLVR 16FR STAT (SET/KITS/TRAYS/PACK) ×1 IMPLANT
TROCAR ADV FIXATION 5X100MM (TROCAR) ×1 IMPLANT
TUBING CONNECTING 10 (TUBING) ×3 IMPLANT
TUBING INSUFFLATION 10FT LAP (TUBING) ×1 IMPLANT
TUBING UROLOGY SET (TUBING) IMPLANT

## 2022-12-11 NOTE — Transfer of Care (Signed)
Immediate Anesthesia Transfer of Care Note  Patient: Brandon Ruiz  Procedure(s) Performed: XI ROBOT ASSISTED LAPAROSCOPIC PARTIAL COLECTOMY CYSTOSCOPY with FIREFLY INJECTION  Patient Location: PACU  Anesthesia Type:General  Level of Consciousness: awake, alert , and oriented  Airway & Oxygen Therapy: Patient Spontanous Breathing and Patient connected to face mask oxygen  Post-op Assessment: Report given to RN and Post -op Vital signs reviewed and stable  Post vital signs: Reviewed and stable  Last Vitals:  Vitals Value Taken Time  BP 163/108 12/11/22 1415  Temp    Pulse 91 12/11/22 1420  Resp 0 12/11/22 1420  SpO2 96 % 12/11/22 1420  Vitals shown include unvalidated device data.  Last Pain:  Vitals:   12/11/22 0929  TempSrc:   PainSc: 0-No pain      Patients Stated Pain Goal: 4 (12/11/22 0929)  Complications: No notable events documented.

## 2022-12-11 NOTE — Op Note (Signed)
Procedure: Cystoscopy with bilateral ureteral catheterization and instillation of firefly solution.  Preop diagnosis: Sigmoid colon neoplasm.  Postop diagnosis: Same.  Surgeon: Dr. Bjorn Pippin.  Anesthesia: General.  Specimen: None.  Drains: 16 French Foley catheter.  EBL: None.  Complications: None.  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  Firefly instillation was requested to aid ureteral identification.  Procedure: He was taken the operating room where general anesthetic was induced.  He was given cefotetan.  He was placed in lithotomy position and fitted with PAS hose.  His perineum and genitalia were prepped with Betadine solution was draped in usual sterile fashion.  Cystoscopy was performed using a 21 [place] scope and 30 degree lens.  Examination revealed a normal urethra.  The external sphincter was intact.  The prostatic urethra short with bilobar hyperplasia without a middle lobe.  Examination of bladder revealed a smooth wall without tumors, stones or inflammation.  Ureteral orifices were unremarkable.  The left ureteral orifice was cannulated with a 5 [place] open-ended catheter and 7.5 mL of firefly solution was gently instilled without difficulty.  This was then repeated on the right side with a similar volume.  The cystoscope was then removed and a Foley catheter was inserted.  The catheter was inflated with 10 mL of sterile fluid and placed to straight drainage.  There were no complications during this portion of the procedure.

## 2022-12-11 NOTE — Op Note (Signed)
Preoperative diagnosis: colon mass  Postoperative diagnosis: same   Procedure: robotic sigmoid colon resection with colorectal anastomosis  Surgeon: Feliciana Rossetti, M.D.  Asst: Wenda Low, M.D.  Anesthesia: GETA  Indications for procedure: Brandon Ruiz is a 47 y.o. year old male with colon mass seen on colonoscopy presented to the operating room for resection. 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 He has no findings of lung or liver lesions to suggest distant metastasis.  Description of procedure: The patient was brought into the operative suite. Anesthesia was administered with General endotracheal anesthesia. WHO checklist was applied. The patient was then placed in lithotomy position. The area was prepped and draped in the usual sterile fashion.  A left subcostal incision was made. A 5mm trocar was used to gain access to the peritoneal cavity by optical entry technique. Pneumoperitoneum was applied with a high flow and low pressure. The laparoscope was reinserted to confirm position. A right lateral 5 mm trocar and right lower 12 mm trocar was placed. An 8 mm trocar was placed in the left mid abdomen. An 8 mm trocar was placed in the right periumbilical area. The robot was docked.  The colon was identified. It appeared adhered to the left abdominal wall. The tattoo was identified and mass seen just proximal to that. Adhesions of the colon to the abdominal wall were lysed with harmonic scalpel. The medial peritoneal reflection was entered, the vessels were identified. Further dissection of the retroperitoneum allowed identification of the ureter and gonadal vessels. The mesentery of the colon was then taken with harmonic scalpel. This was continued distally until the rectosigmoid junction. A portion beyond inflammation at the rectosigmoid junction was chosen for division. A blue 60 mm robotic linear stapler was used to divide the large intestine.    Next, The white line of Toldt was incised proximally and the left colon mobilized from the abdominal wall. A location of the distal left colon was chosen for division.   Next, a Fannestiel incision was made. Cautery was used to dissect through the subcutaneous tissue and the fascia was incised. A wound protector was put in place. The sigmoid colon specimen was removed. The proximal colon was extracted. A pursestring device was used to pursestring the colon with 2-0 prolene. The specimen was removed. The colon was dilated and a 29 mm anvil was chosen and pursestring was tied down around the anvil. The colon was placed back into the abdomen. Insufflation was resumed.   The rectum was inspected and dilator passed up to the staple line. The stapler was introduced and spike brought out just anterior to the staple line. The anvil was connected and the stapler was clamped. Anastomosis was performed and stapler removed. Rigid proctoscopy was used to leak test. The anastomosis was at 15 cm from the anal verge. No bubbles were seen. The abdomen and pelvis were irrigated and fluid removed. Pneumoperitoneum was evacuated. Colon instrument and gown change out was performed.   The peritoneum was closed with 0 vicryl. The fascia was closed with 0 PDS in running fashion for the extraction site. The periumbilical incision was enlarged and the fascia was dissected free. A 1 cm defect was repair with multiple interrupted 0 PDS sutures. Skin incisions were closed with 4-0 monocryl subcuticular stitch. Dermabond was put in place for dressings. Honecomb was placed over the extraction site  Findings: tattoo in the distal sigmoid colon, palpable mass just proximal, no findings of peritoneal masses  or liver masses  Specimen: sigmoid colon, distal donut of colon  Implant: none   Blood loss: 100 ml  Local anesthesia:  50 ml Exparel:Marcaine Mix  Complications: none  Feliciana Rossetti, M.D. General, Bariatric, &  Minimally Invasive Surgery [facility]

## 2022-12-11 NOTE — Anesthesia Postprocedure Evaluation (Signed)
Anesthesia Post Note  Patient: Kyandre Chludzinski  Procedure(s) Performed: XI ROBOT ASSISTED LAPAROSCOPIC PARTIAL COLECTOMY CYSTOSCOPY with FIREFLY INJECTION     Patient location during evaluation: PACU Anesthesia Type: General Level of consciousness: awake and alert Pain management: pain level controlled Vital Signs Assessment: post-procedure vital signs reviewed and stable Respiratory status: spontaneous breathing, nonlabored ventilation, respiratory function stable and patient connected to nasal cannula oxygen Cardiovascular status: blood pressure returned to baseline and stable Postop Assessment: no apparent nausea or vomiting Anesthetic complications: no   No notable events documented.  Last Vitals:  Vitals:   12/11/22 0910 12/11/22 1415  BP: (!) 149/105 (!) 163/108  Pulse:  91  Resp:  19  Temp:    SpO2:  94%    Last Pain:  Vitals:   12/11/22 0929  TempSrc:   PainSc: 0-No pain                 Wania Longstreth

## 2022-12-11 NOTE — Anesthesia Procedure Notes (Signed)
Procedure Name: Intubation Date/Time: 12/11/2022 10:40 AM  Performed by: Delyla Sandeen D, CRNAPre-anesthesia Checklist: Patient identified, Emergency Drugs available, Suction available and Patient being monitored Patient Re-evaluated:Patient Re-evaluated prior to induction Oxygen Delivery Method: Circle system utilized Preoxygenation: Pre-oxygenation with 100% oxygen Induction Type: IV induction Ventilation: Mask ventilation without difficulty Laryngoscope Size: Mac and 4 Grade View: Grade III Tube type: Oral Tube size: 7.5 mm Number of attempts: 1 Airway Equipment and Method: Stylet and Oral airway Placement Confirmation: ETT inserted through vocal cords under direct vision, positive ETCO2 and breath sounds checked- equal and bilateral Secured at: 24 cm Tube secured with: Tape Dental Injury: Teeth and Oropharynx as per pre-operative assessment

## 2022-12-11 NOTE — Consult Note (Signed)
Subjective:    Consult requested by Dr. Feliciana Rossetti.  Brandon Ruiz has a sigmoid dysplasia with resumed malignancy and is to undergo a robotic resection.  I was asked in inject firefly for ureteral visualization.   He has no prior GU history.  ROS:  Review of Systems  All other systems reviewed and are negative.   Allergies  Allergen Reactions   Penicillins Itching    Past Medical History:  Diagnosis Date   Anemia    Diabetes mellitus without complication (HCC)    Hyperlipidemia    Hypertension    RBBB     Past Surgical History:  Procedure Laterality Date   APPENDECTOMY     COLONOSCOPY     INCISION AND DRAINAGE OF WOUND Right 05/22/2022   Procedure: IRRIGATION AND DEBRIDEMENT RIGHT GROIN;  Surgeon: Kinsinger, De Blanch, MD;  Location: [facility];  Service: General;  Laterality: Right;   TONSILLECTOMY      Social History   Socioeconomic History   Marital status: Single    Spouse name: Not on file   Number of children: Not on file   Years of education: Not on file   Highest education level: Not on file  Occupational History   Not on file  Tobacco Use   Smoking status: Never   Smokeless tobacco: Never  Vaping Use   Vaping Use: Never used  Substance and Sexual Activity   Alcohol use: Yes    Alcohol/week: 3.0 standard drinks of alcohol    Types: 3 Cans of beer per week   Drug use: Never   Sexual activity: Yes    Partners: Female  Other Topics Concern   Not on file  Social History Narrative   Not on file   Social Determinants of Health   Financial Resource Strain: Not on file  Food Insecurity: No Food Insecurity (10/20/2022)   Hunger Vital Sign    Worried About Running Out of Food in the Last Year: Never true    Ran Out of Food in the Last Year: Never true  Transportation Needs: No Transportation Needs (10/20/2022)   PRAPARE - Administrator, Civil Service (Medical): No    Lack of Transportation (Non-Medical): No  Physical Activity: Not on file   Stress: Not on file  Social Connections: Not on file  Intimate Partner Violence: Not At Risk (10/20/2022)   Humiliation, Afraid, Rape, and Kick questionnaire    Fear of Current or Ex-Partner: No    Emotionally Abused: No    Physically Abused: No    Sexually Abused: No    Family History  Problem Relation Age of Onset   Lung cancer Father    Colon polyps Neg Hx    Colon cancer Neg Hx    Esophageal cancer Neg Hx    Stomach cancer Neg Hx    Rectal cancer Neg Hx     Anti-infectives: Anti-infectives (From admission, onward)    Start     Dose/Rate Route Frequency Ordered Stop   12/11/22 1400  neomycin (MYCIFRADIN) tablet 1,000 mg  Status:  Discontinued       See Hyperspace for full Linked Orders Report.   1,000 mg Oral 3 times per day 12/11/22 0857 12/11/22 0901   12/11/22 1400  metroNIDAZOLE (FLAGYL) tablet 1,000 mg  Status:  Discontinued       See Hyperspace for full Linked Orders Report.   1,000 mg Oral 3 times per day 12/11/22 0857 12/11/22 0901   12/11/22 0900  cefoTEtan (  CEFOTAN) 2 g in sodium chloride 0.9 % 100 mL IVPB        2 g 200 mL/hr over 30 Minutes Intravenous On call to O.R. 12/11/22 0857 12/12/22 0559       Current Facility-Administered Medications  Medication Dose Route Frequency Provider Last Rate Last Admin   bupivacaine liposome (EXPAREL) 1.3 % injection 266 mg  20 mL Infiltration Once Kinsinger, De Blanch, MD       cefoTEtan (CEFOTAN) 2 g in sodium chloride 0.9 % 100 mL IVPB  2 g Intravenous On Call to OR Kinsinger, De Blanch, MD       chlorhexidine (HIBICLENS) 4 % liquid 1 Application  1 Application Topical Once Kinsinger, De Blanch, MD       feeding supplement (ENSURE PRE-SURGERY) liquid 296 mL  296 mL Oral Once Kinsinger, De Blanch, MD       feeding supplement (ENSURE PRE-SURGERY) liquid 592 mL  592 mL Oral Once Kinsinger, De Blanch, MD       lactated ringers infusion   Intravenous Continuous Beryle Lathe, MD 10 mL/hr at 12/11/22 0923 New Bag at  12/11/22 0923     Objective: Vital signs in last 24 hours: BP (!) 149/105 (BP Location: Right Arm)   Pulse 94   Temp 98 F (36.7 C) (Oral)   Resp 20   SpO2 98%   Intake/Output from previous day: No intake/output data recorded. Intake/Output this shift: No intake/output data recorded.   Physical Exam Vitals reviewed.  Constitutional:      Appearance: Normal appearance.  Neurological:     Mental Status: He is alert.     Lab Results:  Results for orders placed or performed during the hospital encounter of 12/11/22 (from the past 24 hour(s))  Glucose, capillary     Status: Abnormal   Collection Time: 12/11/22  9:09 AM  Result Value Ref Range   Glucose-Capillary 156 (H) 70 - 99 mg/dL   Comment 1 Notify RN    Comment 2 Document in Chart     BMET No results for input(s): "NA", "K", "CL", "CO2", "GLUCOSE", "BUN", "CREATININE", "CALCIUM" in the last 72 hours. PT/INR No results for input(s): "LABPROT", "INR" in the last 72 hours. ABG No results for input(s): "PHART", "HCO3" in the last 72 hours.  Invalid input(s): "PCO2", "PO2"  Studies/Results: No results found.   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           No follow-ups on file.    CC: Dr. Feliciana Rossetti.      Bjorn Pippin 12/11/2022

## 2022-12-11 NOTE — H&P (Signed)
Chief Complaint: NEW PROBLEM (Sigmoid mass)  History of Present Illness: Brandon Ruiz is a 47 y.o. male who is seen today as an office consultation for evaluation of colon mass  47 yo male presented to the ED with rectal bleeding. He was found to have multiple polyps and a sigmoid colon mass. The mass was biopsies and came back as high grade dysplasia with possible invasive component. There was one rectosigmoid polyp which was high grade dysplasia with negative margin. He denies further bleeding. He denies pain or weight loss.  Review of Systems: A complete review of systems was obtained from the patient. I have reviewed this information and discussed as appropriate with the patient. See HPI as well for other ROS.  Review of Systems Constitutional: Negative. HENT: Negative. Eyes: Negative. Respiratory: Negative. Cardiovascular: Negative. Gastrointestinal: Negative. Genitourinary: Negative. Musculoskeletal: Negative. Skin: Negative. Neurological: Negative. Endo/Heme/Allergies: Negative. Psychiatric/Behavioral: Negative.   Medical History: Past Medical History: Diagnosis Date Diabetes mellitus without complication (CMS/HHS-HCC) Hypertension  There is no problem list on file for this patient.  Past Surgical History: Procedure Laterality Date IRRIGATION AND DEBRIDEMENT RIGHT GROIN 05/22/2022 Dr. Sheliah Hatch   Allergies Allergen Reactions Penicillins Itching  Current Outpatient Medications on File Prior to Visit Medication Sig Dispense Refill BASAGLAR KWIKPEN U-100 INSULIN pen injector (concentration 100 units/mL) Inject subcutaneously insulin LISPRO (HUMALOG KWIKPEN) pen injector (concentration 100 units/mL) Inject 5 Units subcutaneously 3 (three) times daily with meals 15 mL 3 metFORMIN (GLUCOPHAGE) 1000 MG tablet Take by mouth  No current facility-administered medications on file prior to visit.  Family History Problem Relation Age of Onset Diabetes Father   Social  History  Tobacco Use Smoking Status Never Smokeless Tobacco Never   Social History  Socioeconomic History Marital status: Single Tobacco Use Smoking status: Never Smokeless tobacco: Never Substance and Sexual Activity Alcohol use: Never Drug use: Never  Social Determinants of Health  Food Insecurity: No Food Insecurity (10/20/2022) Received from [facility] Hunger Vital Sign Worried About Running Out of Food in the Last Year: Never true Ran Out of Food in the Last Year: Never true Transportation Needs: No Transportation Needs (10/20/2022) Received [facility] Lack of Transportation (Medical): No Lack of Transportation (Non-Medical): No [facility] from [facility]  Objective:  Vitals: 11/02/22 0908 BP: (!) 150/98 Pulse: 92 Temp: 36.8 C (98.2 F) SpO2: 99% Weight: (!) 111.9 kg (246 lb 9.6 oz) Height: 193 cm (6\' 4" ) PainSc: 0-No pain PainLoc: Abdomen  Body mass index is 30.02 kg/m.  Physical Exam Constitutional: Appearance: Normal appearance. HENT: Head: Normocephalic and atraumatic. Pulmonary: Effort: Pulmonary effort is normal. Musculoskeletal: General: Normal range of motion. Cervical back: Normal range of motion. Neurological: General: No focal deficit present. Mental Status: He is alert and oriented to person, place, and time. Mental status is at baseline. Psychiatric: Mood and Affect: Mood normal. Behavior: Behavior normal. Thought Content: Thought content normal.    Labs, Imaging and Diagnostic Testing: I reviewed Dr. Lamar Sprinkles colonoscopy. I reviewed CT scan images showing no masses in the lung or liver and identifiable sigmoid mass. I reviewed pathology results.  Assessment and Plan:  Diagnoses and all orders for this visit:  Cancer of sigmoid colon (CMS/HHS-HCC)  Other orders - polyethylene glycol (MIRALAX) powder; Take 238 g by mouth once for 1 dose Take according to your  procedure prep instructions. - bisacodyL (DULCOLAX) 5 mg EC tablet; Take 4 tablets (20 mg total) by mouth once daily as needed for Constipation for up to  1 dose - metroNIDAZOLE (FLAGYL) 500 MG tablet; Take 2 tablets (1,000 mg total) by mouth 3 (three) times daily for 3 doses Take according to your procedure colon prep instructions - neomycin 500 mg tablet; Take 2 tablets (1,000 mg total) by mouth 3 (three) times daily for 3 doses Take according to your procedure colon prep instructions  Presumed colon cancer in the sigmoid colon. We discussed plan for segmental resection by minimally invasive technique with inpatient stay.  The anatomy & physiology of the digestive tract was discussed. The pathophysiology of the colon was discussed. Natural history risks without surgery was discussed. I feel the risks of no intervention will lead to serious problems that outweigh the operative risks; therefore, I recommended a partial colectomy to remove the pathology. Minimally invasive (Robotic/Laparoscopic) & open techniques were discussed.  Risks such as bleeding, infection, abscess, leak, reoperation, injury to other organs, need for repair of tissues / organs, possible ostomy, hernia, heart attack, stroke, death, and other risks were discussed. I noted a good likelihood this will help address the problem. Goals of post-operative recovery were discussed as well. Need for adequate nutrition, daily bowel regimen and healthy physical activity, to optimize recovery was noted as well. We will work to minimize complications. Educational materials were available as well. Questions were answered. The patient expresses understanding & wishes to proceed with surgery.

## 2022-12-12 LAB — BASIC METABOLIC PANEL
Anion gap: 6 (ref 5–15)
BUN: 6 mg/dL (ref 6–20)
CO2: 24 mmol/L (ref 22–32)
Calcium: 8.8 mg/dL — ABNORMAL LOW (ref 8.9–10.3)
Chloride: 107 mmol/L (ref 98–111)
Creatinine, Ser: 0.69 mg/dL (ref 0.61–1.24)
GFR, Estimated: >60 mL/min (ref >60)
Glucose, Bld: 146 mg/dL — ABNORMAL HIGH (ref 70–99)
Potassium: 3.4 mmol/L — ABNORMAL LOW (ref 3.5–5.1)
Sodium: 137 mmol/L (ref 135–145)

## 2022-12-12 LAB — CBC
HCT: 38.6 % — ABNORMAL LOW (ref 39.0–52.0)
Hemoglobin: 13 g/dL (ref 13.0–17.0)
MCH: 30.1 pg (ref 26.0–34.0)
MCHC: 33.7 g/dL (ref 30.0–36.0)
MCV: 89.4 fL (ref 80.0–100.0)
Platelets: 234 10*3/uL (ref 150–400)
RBC: 4.32 MIL/uL (ref 4.22–5.81)
RDW: 12.6 % (ref 11.5–15.5)
WBC: 9.3 10*3/uL (ref 4.0–10.5)
nRBC: 0 % (ref 0.0–0.2)

## 2022-12-12 LAB — GLUCOSE, CAPILLARY
Glucose-Capillary: 146 mg/dL — ABNORMAL HIGH (ref 70–99)
Glucose-Capillary: 137 mg/dL — ABNORMAL HIGH (ref 70–99)
Glucose-Capillary: 135 mg/dL — ABNORMAL HIGH (ref 70–99)
Glucose-Capillary: 124 mg/dL — ABNORMAL HIGH (ref 70–99)

## 2022-12-12 NOTE — Progress Notes (Signed)
Pharmacy Brief Note - Alvimopan (Entereg)  The standing order set for alvimopan (Entereg) now includes an automatic order to discontinue the drug after the patient has had a bowel movement. The change was approved by the Pharmacy & Therapeutics Committee and the Medical Executive Committee.  This patient has had a bowel movement documented by nursing. Therefore, alvimopan has been discontinued. If there are questions, please contact the pharmacy at [phone].  Thank you  Bernadene Person, PharmD, BCPS [phone] 12/12/2022, 12:27 PM

## 2022-12-12 NOTE — Progress Notes (Signed)
   12/12/22 1332  TOC Brief Assessment  Insurance and Status Reviewed  Patient has primary care physician Yes  Home environment has been reviewed home  Prior level of function: independent  Prior/Current Home Services No current home services  Social Determinants of Health Reivew SDOH reviewed no interventions necessary  Readmission risk has been reviewed Yes  Transition of care needs no transition of care needs at this time

## 2022-12-12 NOTE — Progress Notes (Signed)
  1 Day Post-Op   Chief Complaint/Subjective: Pain moderate, tolerating liquids well, small BM yesterday  Objective: Vital signs in last 24 hours: Temp:  [97.3 F (36.3 C)-98.4 F (36.9 C)] 97.9 F (36.6 C) (06/18 0552) Pulse Rate:  [79-94] 82 (06/18 0552) Resp:  [8-20] 18 (06/18 0552) BP: (121-163)/(77-108) 137/90 (06/18 0552) SpO2:  [90 %-100 %] 95 % (06/18 0552) Weight:  [111.2 kg-116.4 kg] 116.4 kg (06/18 0426) Last BM Date : 12/10/22 Intake/Output from previous day: 06/17 0701 - 06/18 0700 In: 3757.4 [P.O.:480; I.V.:2677.4] Out: 3900 [Urine:3850; Blood:50]  PE: Gen: NAD Resp: nonlabored Card: RRR Abd: soft, incision c/d/i  Lab Results:  Recent Labs    12/12/22 0725  WBC 9.3  HGB 13.0  HCT 38.6*  PLT 234   Recent Labs    12/11/22 1615  CREATININE 0.76   No results for input(s): "LABPROT", "INR" in the last 72 hours.    Component Value Date/Time   NA 138 11/29/2022 0822   K 4.1 11/29/2022 0822   CL 106 11/29/2022 0822   CO2 22 11/29/2022 0822   GLUCOSE 150 (H) 11/29/2022 0822   BUN 15 11/29/2022 0822   CREATININE 0.76 12/11/2022 1615   CALCIUM 9.3 11/29/2022 0822   PROT 5.7 (L) 10/20/2022 0417   ALBUMIN 3.2 (L) 10/20/2022 0417   AST 12 (L) 10/20/2022 0417   ALT 14 10/20/2022 0417   ALKPHOS 52 10/20/2022 0417   BILITOT 1.3 (H) 10/20/2022 0417   GFRNONAA >60 12/11/2022 1615    Assessment/Plan  s/p Procedure(s): XI ROBOT ASSISTED LAPAROSCOPIC PARTIAL COLECTOMY CYSTOSCOPY with FIREFLY INJECTION 12/11/2022    FEN - carb mod diet VTE - lovenox ID - periop abx complete Disposition - inpatient, ambulate   LOS: 1 day   I reviewed last 24 h vitals and pain scores, last 48 h intake and output, last 24 h labs and trends, and last 24 h imaging results.  This care required moderate level of medical decision making.   De Blanch [facility] Surgery at [facility] 12/12/2022, 7:46 AM Please see Amion for pager number during day hours  7:00am-4:30pm or 7:00am -11:30am on weekends

## 2022-12-13 LAB — GLUCOSE, CAPILLARY
Glucose-Capillary: 111 mg/dL — ABNORMAL HIGH (ref 70–99)
Glucose-Capillary: 121 mg/dL — ABNORMAL HIGH (ref 70–99)
Glucose-Capillary: 157 mg/dL — ABNORMAL HIGH (ref 70–99)
Glucose-Capillary: 162 mg/dL — ABNORMAL HIGH (ref 70–99)

## 2022-12-13 NOTE — Progress Notes (Signed)
Mobility Specialist - Progress Note   12/13/22 0959  Mobility  Activity Ambulated with assistance in hallway  Level of Assistance Modified independent, requires aide device or extra time  Assistive Device Front wheel walker  Distance Ambulated (ft) 170 ft  Range of Motion/Exercises Active  Activity Response Tolerated well  Mobility Referral Yes  $Mobility charge 1 Mobility  Mobility Specialist Start Time (ACUTE ONLY) 0946  Mobility Specialist Stop Time (ACUTE ONLY) 0955  Mobility Specialist Time Calculation (min) (ACUTE ONLY) 9 min   Pt received in chair and agreed to mobility. Had no issues throughout session, endorsed some pain in abdomen, preventing him to stand fully straight during ambulation.  Pt returned to chair with all needs met.  Marilynne Halsted Mobility Specialist

## 2022-12-13 NOTE — Progress Notes (Signed)
  2 Days Post-Op   Chief Complaint/Subjective: Pain controlled, normal BM yesterday, ambulating, tolerated regular diet  Objective: Vital signs in last 24 hours: Temp:  [97.9 F (36.6 C)-99.1 F (37.3 C)] 97.9 F (36.6 C) (06/19 0458) Pulse Rate:  [75-87] 75 (06/19 0458) Resp:  [17-18] 18 (06/19 0458) BP: (123-132)/(82-86) 123/82 (06/19 0458) SpO2:  [95 %-96 %] 96 % (06/19 0458) Last BM Date : 12/12/22 Intake/Output from previous day: 06/18 0701 - 06/19 0700 In: 1080 [P.O.:1080] Out: 603 [Urine:603]  PE: Gen: NAD Resp: nonlabored Card: RRR Abd: soft, incisions c/d/i  Lab Results:  Recent Labs    12/12/22 0725  WBC 9.3  HGB 13.0  HCT 38.6*  PLT 234   Recent Labs    12/11/22 1615 12/12/22 0725  NA  --  137  K  --  3.4*  CL  --  107  CO2  --  24  GLUCOSE  --  146*  BUN  --  6  CREATININE 0.76 0.69  CALCIUM  --  8.8*   No results for input(s): "LABPROT", "INR" in the last 72 hours.    Component Value Date/Time   NA 137 12/12/2022 0725   K 3.4 (L) 12/12/2022 0725   CL 107 12/12/2022 0725   CO2 24 12/12/2022 0725   GLUCOSE 146 (H) 12/12/2022 0725   BUN 6 12/12/2022 0725   CREATININE 0.69 12/12/2022 0725   CALCIUM 8.8 (L) 12/12/2022 0725   PROT 5.7 (L) 10/20/2022 0417   ALBUMIN 3.2 (L) 10/20/2022 0417   AST 12 (L) 10/20/2022 0417   ALT 14 10/20/2022 0417   ALKPHOS 52 10/20/2022 0417   BILITOT 1.3 (H) 10/20/2022 0417   GFRNONAA >60 12/12/2022 0725    Assessment/Plan  s/p Procedure(s): XI ROBOT ASSISTED LAPAROSCOPIC PARTIAL COLECTOMY CYSTOSCOPY with FIREFLY INJECTION 12/11/2022   FEN - carb mod diet VTE - lovenox ID - no issues Disposition - ambulating, likely home tomorrow   LOS: 2 days   I reviewed last 24 h vitals and pain scores, last 48 h intake and output, last 24 h labs and trends, and last 24 h imaging results.  This care required moderate level of medical decision making.   De Blanch [facility] Surgery at [facility] 12/13/2022, 9:57 AM Please see Amion for pager number during day hours 7:00am-4:30pm or 7:00am -11:30am on weekends

## 2022-12-14 ENCOUNTER — Encounter: Payer: Self-pay | Admitting: *Deleted

## 2022-12-14 ENCOUNTER — Other Ambulatory Visit: Payer: Self-pay

## 2022-12-14 DIAGNOSIS — C187 Malignant neoplasm of sigmoid colon: Secondary | ICD-10-CM

## 2022-12-14 LAB — SURGICAL PATHOLOGY

## 2022-12-14 LAB — GLUCOSE, CAPILLARY: Glucose-Capillary: 135 mg/dL — ABNORMAL HIGH (ref 70–99)

## 2022-12-14 MED ORDER — OXYCODONE HCL 5 MG PO TABS
5.0000 mg | ORAL_TABLET | Freq: Four times a day (QID) | ORAL | 0 refills | Status: DC | PRN
Start: 2022-12-14 — End: 2023-01-03

## 2022-12-14 MED ORDER — ACETAMINOPHEN 500 MG PO TABS
1000.0000 mg | ORAL_TABLET | Freq: Three times a day (TID) | ORAL | 0 refills | Status: AC
Start: 2022-12-14 — End: 2022-12-19

## 2022-12-14 NOTE — Progress Notes (Signed)
Patient and wife provided with discharge education, both verbalized understanding. IV's removed.

## 2022-12-14 NOTE — Discharge Summary (Signed)
Physician Discharge Summary  Kadden Devone MWN:[id number] DOB: May 01, 1976 DOA: 12/11/2022  PCP: Etta Grandchild, MD  Admit date: 12/11/2022 Discharge date:  12/14/2022   Recommendations for Outpatient Follow-up:   (include homehealth, outpatient follow-up instructions, specific recommendations for PCP to follow-up on, etc.)   Follow-up Information     Dallis Darden, De Blanch, MD Follow up on 01/03/2023.   Specialty: General Surgery Contact information: [address] [place] [address] [phone]                Discharge Diagnoses:  Principal Problem:   Colon cancer [facility])   Surgical Procedure: robotic sigmoid colon resection  Discharge Condition: Good Disposition: Home  Diet recommendation: carb mod diet   Hospital Course:  47 yo male with colon mass presented for resection. After surgery he was admitted to surgical floor. He had a BM POD 1. He was advanced on a diet and discharged POD 3. Path was reviewed with patient prior to discharge.  Discharge Instructions  Discharge Instructions     Ambulate hourly while awake   Complete by: As directed    Call MD for:  difficulty breathing, headache or visual disturbances   Complete by: As directed    Call MD for:  persistant dizziness or light-headedness   Complete by: As directed    Call MD for:  persistant nausea and vomiting   Complete by: As directed    Call MD for:  redness, tenderness, or signs of infection (pain, swelling, redness, odor or green/yellow discharge around incision site)   Complete by: As directed    Call MD for:  severe uncontrolled pain   Complete by: As directed    Call MD for:  temperature >101 F   Complete by: As directed    Diet Carb Modified   Complete by: As directed    Discharge wound care:   Complete by: As directed    Remove Bandaids tomorrow, ok to shower tomorrow. Steristrips may fall off in 1-3 weeks.   Incentive spirometry   Complete by: As directed    Perform  hourly while awake      Allergies as of 12/14/2022       Reactions   Penicillins Itching        Medication List     STOP taking these medications    Zegalogue 0.6 MG/0.6ML Soaj Generic drug: Dasiglucagon HCl       TAKE these medications    acetaminophen 500 MG tablet Commonly known as: TYLENOL Take 2 tablets (1,000 mg total) by mouth every 8 (eight) hours for 5 days.   Basaglar KwikPen 100 UNIT/ML Inject 30 Units into the skin at bedtime. What changed:  how much to take when to take this   Dexcom G7 Receiver Devi 1 Act by Does not apply route daily.   Dexcom G7 Sensor Misc 1 Act by Does not apply route daily.   empagliflozin 10 MG Tabs tablet Commonly known as: Jardiance Take 1 tablet (10 mg total) by mouth daily before breakfast.   metFORMIN 1000 MG tablet Commonly known as: GLUCOPHAGE Take 1 tablet by mouth twice daily with a meal   metoprolol succinate 25 MG 24 hr tablet Commonly known as: TOPROL-XL Take 1 tablet (25 mg total) by mouth daily.   olmesartan 20 MG tablet Commonly known as: BENICAR TAKE ONE TABLET BY MOUTH ONE TIME DAILY   oxyCODONE 5 MG immediate release tablet Commonly known as: Oxy IR/ROXICODONE Take 1 tablet (5 mg total)  by mouth every 6 (six) hours as needed for severe pain.   rosuvastatin 10 MG tablet Commonly known as: Crestor Take 1 tablet (10 mg total) by mouth daily.               Discharge Care Instructions  (From admission, onward)           Start     Ordered   12/14/22 0000  Discharge wound care:       Comments: Remove Bandaids tomorrow, ok to shower tomorrow. Steristrips may fall off in 1-3 weeks.   12/14/22 0757            Follow-up Information     Yakira Duquette, De Blanch, MD Follow up on 01/03/2023.   Specialty: General Surgery Contact information: [address] [place] [address] [phone]                  The results of significant diagnostics from this  hospitalization (including imaging, microbiology, ancillary and laboratory) are listed below for reference.    Significant Diagnostic Studies: US THYROID  Result Date: 11/29/2022 CLINICAL DATA:  Incidental on CT. Questioned left-sided thyroid nodule on chest CT performed 10/31/2022 EXAM: THYROID ULTRASOUND TECHNIQUE: Ultrasound examination of the thyroid gland and adjacent soft tissues was performed. COMPARISON:  Chest CT-10/31/2022 FINDINGS: Parenchymal Echotexture: Mildly heterogenous Isthmus: Normal in size measuring 0.8 cm in diameter Right lobe: Borderline enlarged measuring 5.1 x 1.7 x 1.6 cm Left lobe: Enlarged measuring 6.2 x 3.2 x 1.9 cm _________________________________________________________ Estimated total number of nodules >/= 1 cm: 5 Number of spongiform nodules >/=  2 cm not described below (TR1): 0 Number of mixed cystic and solid nodules >/= 1.5 cm not described below (TR2): 0 _________________________________________________________ There is a punctate (0.7 cm) hypoechoic nodule within the superior pole of the right lobe of the thyroid (labeled 1), which does not meet criteria to recommend percutaneous sampling or continued dedicated follow-up. There is a 1.1 cm isoechoic spongiform/benign-appearing nodule within the inferior pole of the right lobe of the thyroid (labeled 2), which does not meet criteria to recommend percutaneous sampling or continued dedicated follow-up. _________________________________________________________ Nodule # 3: Location: Left; Mid Maximum size: 1.3 cm; Other 2 dimensions: 1.0 x 0.8 cm Composition: cannot determine (2) Echogenicity: hypoechoic (2) Shape: not taller-than-wide (0) Margins: smooth (0) Echogenic foci: none (0) ACR TI-RADS total points: 4. ACR TI-RADS risk category: TR4 (4-6 points). ACR TI-RADS recommendations: *Given size (>/= 1 - 1.4 cm) and appearance, a follow-up ultrasound in 1 year should be considered based on TI-RADS criteria.  _________________________________________________________ There is a 1.1 cm isoechoic spongiform/benign-appearing nodule within the mid aspect of the left lobe of the thyroid (labeled 4), which does not meet imaging criteria to recommend percutaneous sampling or continued dedicated follow-up. _________________________________________________________ Nodule # 5: Location: Left; Inferior Maximum size: 2.2 cm; Other 2 dimensions: 2.1 x 1.8 cm Composition: solid/almost completely solid (2) Echogenicity: isoechoic (1) Shape: not taller-than-wide (0) Margins: ill-defined (0) Echogenic foci: none (0) ACR TI-RADS total points: 3. ACR TI-RADS risk category: TR3 (3 points). ACR TI-RADS recommendations: *Given size (>/= 1.5 - 2.4 cm) and appearance, a follow-up ultrasound in 1 year should be considered based on TI-RADS criteria. _________________________________________________________ Nodule # 6: Location: Left; Inferior Maximum size: 1.9 cm; Other 2 dimensions: 1.9 x 1.9 cm Composition: solid/almost completely solid (2) Echogenicity: isoechoic (1) Shape: not taller-than-wide (0) Margins: ill-defined (0) Echogenic foci: none (0) ACR TI-RADS total points: 3. ACR TI-RADS risk category: TR3 (3  points). ACR TI-RADS recommendations: *Given size (>/= 1.5 - 2.4 cm) and appearance, a follow-up ultrasound in 1 year should be considered based on TI-RADS criteria. _________________________________________________________ IMPRESSION: 1. Borderline thyromegaly with findings suggestive of multinodular goiter. 2. Nodules labeled #3, #5 and #6 all meet imaging criteria to recommend a 1 year follow-up as indicated. The above is in keeping with the ACR TI-RADS recommendations - J Am Coll Radiol 2017;14:587-595. Electronically Signed   By: Simonne Come M.D.   On: 11/29/2022 16:04    Labs: Basic Metabolic Panel: Recent Labs  Lab 12/11/22 1615 12/12/22 0725  NA  --  137  K  --  3.4*  CL  --  107  CO2  --  24  GLUCOSE  --  146*  BUN   --  6  CREATININE 0.76 0.69  CALCIUM  --  8.8*   Liver Function Tests: No results for input(s): "AST", "ALT", "ALKPHOS", "BILITOT", "PROT", "ALBUMIN" in the last 168 hours.  CBC: Recent Labs  Lab 12/12/22 0725  WBC 9.3  HGB 13.0  HCT 38.6*  MCV 89.4  PLT 234    CBG: Recent Labs  Lab 12/12/22 2018 12/13/22 0735 12/13/22 1134 12/13/22 1627 12/13/22 2056  GLUCAP 124* 111* 121* 157* 162*    Principal Problem:   Colon cancer (HCC)   Time coordinating discharge: 15 min

## 2022-12-14 NOTE — Progress Notes (Signed)
PATIENT NAVIGATOR PROGRESS NOTE  Name: Brandon Ruiz Date: 12/14/2022 MRN: [id number]  DOB: 02/29/1976   Reason for visit:  New pt appt  Comments: Spoke with pt and have him scheduled with Dr Truett Perna on January 09, 2023 at 1:40  Directions to building and parking reviewed and given contact information    Time spent counseling/coordinating care: 30-45 minutes

## 2022-12-14 NOTE — Discharge Instructions (Signed)
  Managing Your Pain After Surgery Without Opioids    Thank you for participating in our program to help patients manage their pain after surgery without opioids. This is part of our effort to provide you with the best care possible, without exposing you or your family to the risk that opioids pose.  What pain can I expect after surgery? You can expect to have some pain after surgery. This is normal. The pain is typically worse the day after surgery, and quickly begins to get better. Many studies have found that many patients are able to manage their pain after surgery with Over-the-Counter (OTC) medications such as Tylenol and Motrin. If you have a condition that does not allow you to take Tylenol or Motrin, notify your surgical team.  How will I manage my pain? The best strategy for controlling your pain after surgery is around the clock pain control with Tylenol (acetaminophen) and Motrin (ibuprofen or Advil). Alternating these medications with each other allows you to maximize your pain control. In addition to Tylenol and Motrin, you can use heating pads or ice packs on your incisions to help reduce your pain.  How will I alternate your regular strength over-the-counter pain medication? You will take a dose of pain medication every three hours. Start by taking 650 mg of Tylenol (2 pills of 325 mg) 3 hours later take 600 mg of Motrin (3 pills of 200 mg) 3 hours after taking the Motrin take 650 mg of Tylenol 3 hours after that take 600 mg of Motrin.   - 1 -  See example - if your first dose of Tylenol is at 12:00 PM   12:00 PM Tylenol 650 mg (2 pills of 325 mg)  3:00 PM Motrin 600 mg (3 pills of 200 mg)  6:00 PM Tylenol 650 mg (2 pills of 325 mg)  9:00 PM Motrin 600 mg (3 pills of 200 mg)  Continue alternating every 3 hours   We recommend that you follow this schedule around-the-clock for at least 3 days after surgery, or until you feel that it is no longer needed. Use the table  on the last page of this handout to keep track of the medications you are taking. Important: Do not take more than 3000mg of Tylenol or 3200mg of Motrin in a 24-hour period. Do not take ibuprofen/Motrin if you have a history of bleeding stomach ulcers, severe kidney disease, &/or actively taking a blood thinner  What if I still have pain? If you have pain that is not controlled with the over-the-counter pain medications (Tylenol and Motrin or Advil) you might have what we call "breakthrough" pain. You will receive a prescription for a small amount of an opioid pain medication such as Oxycodone, Tramadol, or Tylenol with Codeine. Use these opioid pills in the first 24 hours after surgery if you have breakthrough pain. Do not take more than 1 pill every 4-6 hours.  If you still have uncontrolled pain after using all opioid pills, don't hesitate to call our staff using the number provided. We will help make sure you are managing your pain in the best way possible, and if necessary, we can provide a prescription for additional pain medication.   Day 1    Time  Name of Medication Number of pills taken  Amount of Acetaminophen  Pain Level   Comments  AM PM       AM PM       AM PM         AM PM       AM PM       AM PM       AM PM       AM PM       Total Daily amount of Acetaminophen Do not take more than  3,000 mg per day      Day 2    Time  Name of Medication Number of pills taken  Amount of Acetaminophen  Pain Level   Comments  AM PM       AM PM       AM PM       AM PM       AM PM       AM PM       AM PM       AM PM       Total Daily amount of Acetaminophen Do not take more than  3,000 mg per day      Day 3    Time  Name of Medication Number of pills taken  Amount of Acetaminophen  Pain Level   Comments  AM PM       AM PM       AM PM       AM PM         AM PM       AM PM       AM PM       AM PM       Total Daily amount of Acetaminophen Do not take more  than  3,000 mg per day      Day 4    Time  Name of Medication Number of pills taken  Amount of Acetaminophen  Pain Level   Comments  AM PM       AM PM       AM PM       AM PM       AM PM       AM PM       AM PM       AM PM       Total Daily amount of Acetaminophen Do not take more than  3,000 mg per day      Day 5    Time  Name of Medication Number of pills taken  Amount of Acetaminophen  Pain Level   Comments  AM PM       AM PM       AM PM       AM PM       AM PM       AM PM       AM PM       AM PM       Total Daily amount of Acetaminophen Do not take more than  3,000 mg per day      Day 6    Time  Name of Medication Number of pills taken  Amount of Acetaminophen  Pain Level  Comments  AM PM       AM PM       AM PM       AM PM       AM PM       AM PM       AM PM       AM PM       Total Daily amount of Acetaminophen Do not take more than  3,000 mg   per day      Day 7    Time  Name of Medication Number of pills taken  Amount of Acetaminophen  Pain Level   Comments  AM PM       AM PM       AM PM       AM PM       AM PM       AM PM       AM PM       AM PM       Total Daily amount of Acetaminophen Do not take more than  3,000 mg per day        For additional information about how and where to safely dispose of unused opioid medications - https://www.morepowerfulnc.org  Disclaimer: This document contains information and/or instructional materials adapted from [facility] for the typical patient with your condition. It does not replace medical advice from your health care provider because your experience may differ from that of the typical patient. Talk to your health care provider if you have any questions about this document, your condition or your treatment plan. Adapted from [facility]  

## 2022-12-14 NOTE — Progress Notes (Signed)
Received in basket message from Dr Sheliah Hatch requesting consultation for this patient.  Referral entered.

## 2022-12-15 ENCOUNTER — Telehealth: Payer: Self-pay

## 2022-12-15 NOTE — Transitions of Care (Post Inpatient/ED Visit) (Signed)
   12/15/2022  Name: Brandon Ruiz MRN: [id number] DOB: 11/29/1975  Today's TOC FU Call Status: Today's TOC FU Call Status:: Unsuccessul Call (1st Attempt) Unsuccessful Call (1st Attempt) Date: 12/15/22  Attempted to reach the patient regarding the most recent Inpatient/ED visit.  Follow Up Plan: Additional outreach attempts will be made to reach the patient to complete the Transitions of Care (Post Inpatient/ED visit) call.   Signature Kandis Fantasia, LPN [facility] Health Advisor [place] [facility] You Are. We Are. [employer] # [phone]

## 2022-12-15 NOTE — Transitions of Care (Post Inpatient/ED Visit) (Signed)
12/15/2022  Name: Brandon Ruiz MRN: [id number] DOB: 01/05/1976  Today's TOC FU Call Status: Today's TOC FU Call Status:: Successful TOC FU Call Competed TOC FU Call Complete Date: 12/15/22  Transition Care Management Follow-up Telephone Call Date of Discharge: 12/14/22 Discharge Facility: [facility]) Type of Discharge: Inpatient Admission Primary Inpatient Discharge Diagnosis:: Malignant neoplasm of sigmoid colon How have you been since you were released from the hospital?: Same Any questions or concerns?: No  Items Reviewed: Did you receive and understand the discharge instructions provided?: Yes Medications obtained,verified, and reconciled?: Yes (Medications Reviewed) Any new allergies since your discharge?: No Dietary orders reviewed?: Yes Type of Diet Ordered:: carb mod diet Do you have support at home?: Yes People in Home: significant other  Medications Reviewed Today: Medications Reviewed Today     Reviewed by Harrietta Guardian, RN (Registered Nurse) on 12/11/22 at 0911  Med List Status: Complete   Medication Order Taking? Sig Documenting Provider Last Dose Status Informant  Continuous Blood Gluc Receiver (DEXCOM G7 RECEIVER) DEVI [id number] Yes 1 Act by Does not apply route daily. Etta Grandchild, MD 12/10/2022 Active Self  Continuous Blood Gluc Sensor (DEXCOM G7 SENSOR) MISC [id number] Yes 1 Act by Does not apply route daily. Etta Grandchild, MD Past Week Active Self  Dasiglucagon HCl (ZEGALOGUE) 0.6 MG/0.6ML Ivory Broad [id number] No Inject 1 Act into the skin daily as needed.  Patient not taking: Reported on 11/24/2022   Etta Grandchild, MD Not Taking Active Self  empagliflozin (JARDIANCE) 10 MG TABS tablet [id number] Yes Take 1 tablet (10 mg total) by mouth daily before breakfast. Etta Grandchild, MD 12/07/2022 Active Self  Insulin Glargine (BASAGLAR KWIKPEN) 100 UNIT/ML [id number] Yes Inject 30 Units into the skin at bedtime.  Patient taking differently: Inject 20 Units into  the skin daily.   Etta Grandchild, MD 12/11/2022 0600 Active Self           Med Note Rudi Heap, GWENDOLYN D   Mon Dec 11, 2022  9:10 AM) 10 units sq  metFORMIN (GLUCOPHAGE) 1000 MG tablet [id number] Yes Take 1 tablet by mouth twice daily with a meal Etta Grandchild, MD 12/07/2022 Active Self  metoprolol succinate (TOPROL-XL) 25 MG 24 hr tablet [id number] Yes Take 1 tablet (25 mg total) by mouth daily. Etta Grandchild, MD 12/11/2022 0600 Active Self  olmesartan (BENICAR) 20 MG tablet [id number] Yes TAKE ONE TABLET BY MOUTH ONE TIME DAILY Etta Grandchild, MD 12/07/2022 Active Self  rosuvastatin (CRESTOR) 10 MG tablet [id number] Yes Take 1 tablet (10 mg total) by mouth daily. Etta Grandchild, MD 12/11/2022 0600 Active Self            Home Care and Equipment/Supplies: Were Home Health Services Ordered?: No Any new equipment or medical supplies ordered?: No  Functional Questionnaire: Do you need assistance with bathing/showering or dressing?: No Do you need assistance with meal preparation?: No Do you need assistance with eating?: No Do you have difficulty maintaining continence: No Do you need assistance with getting out of bed/getting out of a chair/moving?: No Do you have difficulty managing or taking your medications?: No  Follow up appointments reviewed: PCP Follow-up appointment confirmed?: NA Specialist Hospital Follow-up appointment confirmed?: Yes Date of Specialist follow-up appointment?: 01/03/23 Follow-Up Specialty Provider:: Dr. Feliciana Rossetti Do you need transportation to your follow-up appointment?: No Do you understand care options if your condition(s) worsen?: Yes-patient verbalized understanding    SIGNATURE  Kandis Fantasia, LPN [facility] Health Advisor [facility]  [facility] [place] You Are. We Are. [employer] # [phone]

## 2022-12-25 ENCOUNTER — Telehealth: Payer: Self-pay | Admitting: Oncology

## 2022-12-25 NOTE — Telephone Encounter (Signed)
Spoke with patient letting him know upcoming appointment change

## 2022-12-26 ENCOUNTER — Encounter: Payer: Self-pay | Admitting: Oncology

## 2022-12-26 ENCOUNTER — Inpatient Hospital Stay: Payer: Managed Care, Other (non HMO) | Admitting: Oncology

## 2022-12-26 ENCOUNTER — Inpatient Hospital Stay: Payer: Managed Care, Other (non HMO) | Attending: Oncology | Admitting: Oncology

## 2022-12-26 ENCOUNTER — Telehealth: Payer: Self-pay | Admitting: Oncology

## 2022-12-26 DIAGNOSIS — E785 Hyperlipidemia, unspecified: Secondary | ICD-10-CM

## 2022-12-26 DIAGNOSIS — Z5111 Encounter for antineoplastic chemotherapy: Secondary | ICD-10-CM | POA: Insufficient documentation

## 2022-12-26 DIAGNOSIS — I10 Essential (primary) hypertension: Secondary | ICD-10-CM

## 2022-12-26 DIAGNOSIS — E119 Type 2 diabetes mellitus without complications: Secondary | ICD-10-CM

## 2022-12-26 DIAGNOSIS — Z7984 Long term (current) use of oral hypoglycemic drugs: Secondary | ICD-10-CM

## 2022-12-26 DIAGNOSIS — C187 Malignant neoplasm of sigmoid colon: Secondary | ICD-10-CM | POA: Diagnosis not present

## 2022-12-26 NOTE — Progress Notes (Signed)
New Hematology/Oncology TeleHealth Consult   Requesting WU:JWJX Kinsinger  [phone]     Reason for Consult: Colon cancer  I connected with  [name] on 12/26/22 at 11:25 AM EDT by video and verified that I am speaking with the correct person using two identifiers.   I discussed the limitations, risks, security and privacy concerns of performing an evaluation and management service by telemedicine and the availability of in-person appointments. I also discussed with the patient that there may be a patient responsible charge related to this service. The patient expressed understanding and agreed to proceed.   Patient's location: Home Provider's location: Office    HPI: Mr. Rabbitt had a screening Cologuard on 08/24/2022.  The test was positive.  A repeat Cologuard test was positive on 09/01/2022.  He was referred to Dr. Marina Goodell and was taken to a colonoscopy on 10/19/2022.  Multiple polyps were removed from ascending colon, rectosigmoid colon, descending colon, transverse colon, and cecum.  An ulcerated mass was found in the sigmoid colon at 30 cm from anal verge.  The mass was biopsied and the area was tattooed.  The pathology revealed tubular adenomas and tubulovillous adenomas.  The sigmoid colon mass biopsy returned as superficial fragments of an ulcerated tubulovillous adenoma with extensive high-grade dysplasia and areas suspicious for invasion.  He was admitted 10/19/2022 with GI bleeding.  The bleeding resolved on 10/21/2022 and he was discharged home.  A CT angiogram on 10/19/2022 revealed no evidence of active GI bleeding.  An age-indeterminate dissection of the proximal SMA was noted.  An annular mass of the mid sigmoid colon and wall thickening at the medial aspect of the cecum were noted.  CTs of the chest, abdomen, and pelvis on 10/31/2022 showed a calcified right upper lobe nodule with adjacent tiny nodules, some of which have internal calcifications.  Nodular appearance of the left  lobe of the thyroid..  There is mid sigmoid thickening.  No pathologically enlarged abdominal or pelvic lymph nodes.  He was referred to Dr. Sheliah Hatch and underwent a robotic sigmoid colon resection on 12/11/2022.  A tattoo was noted in the distal sigmoid colon with a palpable mass proximal to the tattoo.  No evidence of metastatic disease.  He was discharged home 12/14/2022.  He reports return of bowel function.  He felt well prior to surgery.   Past Medical History:  Diagnosis Date   Anemia when admitted with GI bleeding April 2024    Diabetes mellitus without complication (HCC)    Hyperlipidemia    Hypertension    RBBB    .  GI bleeding following the 10/19/2022 colonoscopy   .  Admission 05/22/2022 with right groin abscess    Past Surgical History:  Procedure Laterality Date   APPENDECTOMY     COLONOSCOPY     INCISION AND DRAINAGE OF WOUND Right 05/22/2022   Procedure: IRRIGATION AND DEBRIDEMENT RIGHT GROIN;  Surgeon: Kinsinger, De Blanch, MD;  Location: [facility];  Service: General;  Laterality: Right;   TONSILLECTOMY    :   Current Outpatient Medications:    Continuous Blood Gluc Receiver (DEXCOM G7 RECEIVER) DEVI, 1 Act by Does not apply route daily., Disp: 9 each, Rfl: 1   Continuous Blood Gluc Sensor (DEXCOM G7 SENSOR) MISC, 1 Act by Does not apply route daily., Disp: 9 each, Rfl: 1   empagliflozin (JARDIANCE) 10 MG TABS tablet, Take 1 tablet (10 mg total) by mouth daily before breakfast., Disp: 90 tablet, Rfl: 1   Insulin Glargine (BASAGLAR  KWIKPEN) 100 UNIT/ML, Inject 30 Units into the skin at bedtime. (Patient taking differently: Inject 20 Units into the skin daily.), Disp: 27 mL, Rfl: 0   metFORMIN (GLUCOPHAGE) 1000 MG tablet, Take 1 tablet by mouth twice daily with a meal, Disp: 180 tablet, Rfl: 0   metoprolol succinate (TOPROL-XL) 25 MG 24 hr tablet, Take 1 tablet (25 mg total) by mouth daily., Disp: 90 tablet, Rfl: 1   olmesartan (BENICAR) 20 MG tablet, TAKE ONE TABLET BY  MOUTH ONE TIME DAILY, Disp: 90 tablet, Rfl: 0   oxyCODONE (OXY IR/ROXICODONE) 5 MG immediate release tablet, Take 1 tablet (5 mg total) by mouth every 6 (six) hours as needed for severe pain., Disp: 10 tablet, Rfl: 0   rosuvastatin (CRESTOR) 10 MG tablet, Take 1 tablet (10 mg total) by mouth daily., Disp: 90 tablet, Rfl: 1:  :   Allergies  Allergen Reactions   Penicillins Itching  :  FH: Father had lung cancer and was a smoker  SOCIAL HISTORY: He is a [place].  He lives with a roommate.  He does not use cigarettes.  He reports social alcohol use.  No risk factor for HIV or hepatitis.  Review of Systems:  Positives include: Mild postoperative abdominal pain  A complete ROS was otherwise negative.    LABS: 10/19/2022: CEA-less than 2.0    RADIOLOGY:  US THYROID  Result Date: 11/29/2022 CLINICAL DATA:  Incidental on CT. Questioned left-sided thyroid nodule on chest CT performed 10/31/2022 EXAM: THYROID ULTRASOUND TECHNIQUE: Ultrasound examination of the thyroid gland and adjacent soft tissues was performed. COMPARISON:  Chest CT-10/31/2022 FINDINGS: Parenchymal Echotexture: Mildly heterogenous Isthmus: Normal in size measuring 0.8 cm in diameter Right lobe: Borderline enlarged measuring 5.1 x 1.7 x 1.6 cm Left lobe: Enlarged measuring 6.2 x 3.2 x 1.9 cm _________________________________________________________ Estimated total number of nodules >/= 1 cm: 5 Number of spongiform nodules >/=  2 cm not described below (TR1): 0 Number of mixed cystic and solid nodules >/= 1.5 cm not described below (TR2): 0 _________________________________________________________ There is a punctate (0.7 cm) hypoechoic nodule within the superior pole of the right lobe of the thyroid (labeled 1), which does not meet criteria to recommend percutaneous sampling or continued dedicated follow-up. There is a 1.1 cm isoechoic spongiform/benign-appearing nodule within the inferior pole of the right lobe of the  thyroid (labeled 2), which does not meet criteria to recommend percutaneous sampling or continued dedicated follow-up. _________________________________________________________ Nodule # 3: Location: Left; Mid Maximum size: 1.3 cm; Other 2 dimensions: 1.0 x 0.8 cm Composition: cannot determine (2) Echogenicity: hypoechoic (2) Shape: not taller-than-wide (0) Margins: smooth (0) Echogenic foci: none (0) ACR TI-RADS total points: 4. ACR TI-RADS risk category: TR4 (4-6 points). ACR TI-RADS recommendations: *Given size (>/= 1 - 1.4 cm) and appearance, a follow-up ultrasound in 1 year should be considered based on TI-RADS criteria. _________________________________________________________ There is a 1.1 cm isoechoic spongiform/benign-appearing nodule within the mid aspect of the left lobe of the thyroid (labeled 4), which does not meet imaging criteria to recommend percutaneous sampling or continued dedicated follow-up. _________________________________________________________ Nodule # 5: Location: Left; Inferior Maximum size: 2.2 cm; Other 2 dimensions: 2.1 x 1.8 cm Composition: solid/almost completely solid (2) Echogenicity: isoechoic (1) Shape: not taller-than-wide (0) Margins: ill-defined (0) Echogenic foci: none (0) ACR TI-RADS total points: 3. ACR TI-RADS risk category: TR3 (3 points). ACR TI-RADS recommendations: *Given size (>/= 1.5 - 2.4 cm) and appearance, a follow-up ultrasound in 1 year should be considered based on TI-RADS  criteria. _________________________________________________________ Nodule # 6: Location: Left; Inferior Maximum size: 1.9 cm; Other 2 dimensions: 1.9 x 1.9 cm Composition: solid/almost completely solid (2) Echogenicity: isoechoic (1) Shape: not taller-than-wide (0) Margins: ill-defined (0) Echogenic foci: none (0) ACR TI-RADS total points: 3. ACR TI-RADS risk category: TR3 (3 points). ACR TI-RADS recommendations: *Given size (>/= 1.5 - 2.4 cm) and appearance, a follow-up ultrasound in 1  year should be considered based on TI-RADS criteria. _________________________________________________________ IMPRESSION: 1. Borderline thyromegaly with findings suggestive of multinodular goiter. 2. Nodules labeled #3, #5 and #6 all meet imaging criteria to recommend a 1 year follow-up as indicated. The above is in keeping with the ACR TI-RADS recommendations - J Am Coll Radiol 2017;14:587-595. Electronically Signed   By: Simonne Come M.D.   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 Colonoscopy 10/19/2022, mass in the sigmoid colon at 30 cm, biopsy-superficial fragments of ulcerated tubulovillous adenoma with extensive high-grade dysplasia and areas spacious for invasion Well to moderately differentiated adenocarcinoma, tumor invades through muscularis propria, no lymphovascular or perineural invasion, negative margins, no macroscopic tumor perforation, no tumor deposits, 1/14 nodes, no loss of mismatch repair protein expression Normal preoperative CEA CTs 10/31/2022-sigmoid wall thickening, no evidence of metastatic disease, calcified right hilar lymph nodes and 14 mm right upper lobe nodule with adjacent tiny nodules favored to reflect prior granulomatous disease 2.  Multiple colon polyps removed on colonoscopy 10/19/2022-tubular adenomas and tubulovillous adenomas 3.  Nodular left thyroid on CT 10/31/2022 Thyroid ultrasound 11/29/2022-multinodular goiter, 3 nodules meet criteria for 1 year follow-up imaging  4.  Diabetes 5.  Hypertension 6.  Admission with GI bleeding following the colonoscopy 10/19/2022 7.  Hyperlipidemia  Mr Tisby has been diagnosed with adenocarcinoma of the sigmoid colon.  He has been diagnosed with stage III colon cancer.  We reviewed details of the surgical pathology report.  We discussed the prognosis and adjuvant treatment options.  He appears to have a good prognosis relative  to many patients with stage III colon cancer based on the 1 positive lymph node and lack of other high risk features.  I explained the increased cure rate associated with adjuvant 5-fluorouracil based chemotherapy in the setting.  We discussed the expected benefit from the addition of oxaliplatin.  We discussed, single agent capecitabine, FOLFOX and CAPOX chemotherapy.  I reviewed data supporting noninferiority of 3 months of adjuvant therapy compared to 6 months in patients without high risk stage III tumors.  We discussed the decreased incidence of neuropathy in patients receiving 3 months of oxaliplatin.  I explained data comparing FOLFOX and CAPOX given for 3 months.  We reviewed the treatment schedule and toxicities associated with the FOLFOX and CAPOX regimens.  He is comfortable proceeding with 3 months of CAPOX.  We reviewed potential toxicities associated with CAPOX regimen including the chance of nausea/vomiting, mucositis, alopecia, diarrhea, hematologic toxicity, infection, and bleeding.  We discussed the sun sensitivity, rash, hyperpigmentation, and hand/foot syndrome associated with capecitabine.  We reviewed the allergic reaction and various types of neuropathy seen with oxaliplatin.  He will be referred for Port-A-Cath placement with the plan to begin adjuvant CAPOX in approximately 2 weeks.  He is scheduled for a second opinion appointment at [facility] on 01/02/2023.  He will return for an office visit and further discussion on 01/03/2023.  He will attend a chemotherapy teaching class.  A chemotherapy plan was entered  today.  Mr Vander not appear to have hereditary nonpolyposis colon cancer syndrome, but his family members are at increased risk of developing colorectal cancer and should receive appropriate screening.  I discussed the assessment and treatment plan with the patient. The patient was provided an opportunity to ask questions and all were answered. The patient agreed with the  plan and demonstrated an understanding of the instructions.    I provided 60 minutes of video, chart review, and documentation time during this encounter, and > 50% was spent counseling as documented under my assessment & plan.  Thornton Papas, MD 12/26/2022 1:25 PM

## 2022-12-26 NOTE — Progress Notes (Signed)
START ON PATHWAY REGIMEN - Colorectal     A cycle is every 21 days:     Capecitabine      Oxaliplatin   **Always confirm dose/schedule in your pharmacy ordering system**  Patient Characteristics: Postoperative without Neoadjuvant Therapy, M0 (Pathologic Staging), Colon, Stage III, Low Risk (pT1-3, pN1) Tumor Location: Colon Therapeutic Status: Postoperative without Neoadjuvant Therapy, M0 (Pathologic Staging) AJCC M Category: cM0 AJCC T Category: pT3 AJCC N Category: pN1a AJCC 8 Stage Grouping: IIIB Intent of Therapy: Curative Intent, Discussed with Patient 

## 2022-12-27 ENCOUNTER — Encounter: Payer: Self-pay | Admitting: *Deleted

## 2022-12-27 ENCOUNTER — Other Ambulatory Visit: Payer: Self-pay

## 2022-12-27 ENCOUNTER — Other Ambulatory Visit: Payer: Self-pay | Admitting: *Deleted

## 2022-12-27 DIAGNOSIS — C187 Malignant neoplasm of sigmoid colon: Secondary | ICD-10-CM

## 2022-12-27 NOTE — Progress Notes (Signed)
PATIENT NAVIGATOR PROGRESS NOTE  Name: Brandon Ruiz Date: 12/27/2022 MRN: [id number]  DOB: [birth year]   Reason for visit:  F/U call after visit with Dr Truett Perna  Comments:  Called and spoke with pt and answered any follow up questions from visit with Dr Truett Perna.  Pt stated that he felt he had a good understanding.  Reviewed PAC placement with him and have him scheduled at [facility] on 7/15 for port placement and reviewed pre procedure directions.  Will give him printed information regarding PAC and appt instructions during F/U appt with Dr Truett Perna on 7/10    Time spent counseling/coordinating care: 30-45 minutes

## 2022-12-27 NOTE — Progress Notes (Signed)
Port for chemo ordered for placement at [facility]

## 2023-01-03 ENCOUNTER — Inpatient Hospital Stay (HOSPITAL_BASED_OUTPATIENT_CLINIC_OR_DEPARTMENT_OTHER): Payer: Managed Care, Other (non HMO) | Admitting: Oncology

## 2023-01-03 ENCOUNTER — Encounter: Payer: Self-pay | Admitting: Oncology

## 2023-01-03 ENCOUNTER — Other Ambulatory Visit: Payer: Self-pay | Admitting: *Deleted

## 2023-01-03 ENCOUNTER — Telehealth: Payer: Self-pay | Admitting: Pharmacist

## 2023-01-03 ENCOUNTER — Other Ambulatory Visit (HOSPITAL_COMMUNITY): Payer: Self-pay

## 2023-01-03 ENCOUNTER — Encounter: Payer: Self-pay | Admitting: *Deleted

## 2023-01-03 ENCOUNTER — Inpatient Hospital Stay: Payer: Managed Care, Other (non HMO)

## 2023-01-03 ENCOUNTER — Other Ambulatory Visit: Payer: Self-pay

## 2023-01-03 VITALS — BP 107/68 | HR 115 | Temp 98.0°F | Resp 18 | Ht 76.5 in | Wt 247.9 lb

## 2023-01-03 DIAGNOSIS — Z5111 Encounter for antineoplastic chemotherapy: Secondary | ICD-10-CM | POA: Diagnosis present

## 2023-01-03 DIAGNOSIS — C187 Malignant neoplasm of sigmoid colon: Secondary | ICD-10-CM

## 2023-01-03 MED ORDER — PROCHLORPERAZINE MALEATE 10 MG PO TABS: 10.0000 mg | ORAL_TABLET | Freq: Four times a day (QID) | ORAL | 0 refills | Status: DC | PRN

## 2023-01-03 MED ORDER — LIDOCAINE-PRILOCAINE 2.5-2.5 % EX CREA: 1.0000 | TOPICAL_CREAM | CUTANEOUS | 0 refills | Status: DC | PRN

## 2023-01-03 MED ORDER — CAPECITABINE 500 MG PO TABS
ORAL_TABLET | ORAL | 0 refills | Status: DC
Start: 2023-01-11 — End: 2023-01-30
  Filled 2023-01-03: qty 126, fill #0
  Filled 2023-01-05: qty 126, 21d supply, fill #0

## 2023-01-03 MED ORDER — CAPECITABINE 500 MG PO TABS
ORAL_TABLET | ORAL | 0 refills | Status: DC
  Filled 2023-01-03: qty 126, fill #0

## 2023-01-03 MED ORDER — ONDANSETRON HCL 8 MG PO TABS: 8.0000 mg | ORAL_TABLET | Freq: Three times a day (TID) | ORAL | 0 refills | Status: DC | PRN

## 2023-01-03 NOTE — Progress Notes (Signed)
[facility] OFFICE PROGRESS NOTE   Diagnosis: Colon cancer  INTERVAL HISTORY:   Mr. Dubie returns as scheduled.  He feels well.  He saw Dr. Molli Knock for a second opinion yesterday.  He reports Dr. Molli Knock agrees with the plan for CAPOX.  The surgical wounds have healed.  He saw Dr. Sheliah Hatch today.    Objective:  Vital signs in last 24 hours:  Blood pressure 107/68, pulse (!) 115, temperature 98 F (36.7 C), temperature source Temporal, resp. rate 18, weight 247 lb 14.4 oz (112.4 kg), SpO2 97 %.   HEENT: Oropharynx without visible mass, prominent bilateral tonsillar tissue, neck without mass Lymph nodes: No cervical, supraclavicular, axillary, or inguinal nodes Resp: end inspiratory bronchial sounds at the upper posterior chest bilaterally, no respiratory distress Cardio: Regular rate and rhythm, tachycardia GI: No hepatosplenomegaly, nontender, healed surgical incisions, resolving ecchymoses surrounding trocar sites GU: Testes without mass Vascular: No leg edema Neuro: Alert and oriented, the motor exam appears intact in the upper and lower extremities bilaterally   Lab Results:  Lab Results  Component Value Date   WBC 9.3 12/12/2022   HGB 13.0 12/12/2022   HCT 38.6 (L) 12/12/2022   MCV 89.4 12/12/2022   PLT 234 12/12/2022   NEUTROABS 6.5 11/01/2022    CMP  Lab Results  Component Value Date   NA 137 12/12/2022   K 3.4 (L) 12/12/2022   CL 107 12/12/2022   CO2 24 12/12/2022   GLUCOSE 146 (H) 12/12/2022   BUN 6 12/12/2022   CREATININE 0.69 12/12/2022   CALCIUM 8.8 (L) 12/12/2022   PROT 5.7 (L) 10/20/2022   ALBUMIN 3.2 (L) 10/20/2022   AST 12 (L) 10/20/2022   ALT 14 10/20/2022   ALKPHOS 52 10/20/2022   BILITOT 1.3 (H) 10/20/2022   GFRNONAA >60 12/12/2022    Lab Results  Component Value Date   CEA1 1.5 10/20/2022   CEA <2.0 10/19/2022      Medications: I have reviewed the patient's current medications.   Assessment/Plan: Sigmoid colon  cancer, stage IIIb (pT3 pN1a), status post a robotic sigmoid resection 6/17/202 Colonoscopy 10/19/2022, mass in the sigmoid colon at 30 cm, biopsy-superficial fragments of ulcerated tubulovillous adenoma with extensive high-grade dysplasia and areas spacious for invasion Well to moderately differentiated adenocarcinoma, tumor invades through muscularis propria, no lymphovascular or perineural invasion, negative margins, no macroscopic tumor perforation, no tumor deposits, 1/14 nodes, no loss of mismatch repair protein expression Normal preoperative CEA CTs 10/31/2022-sigmoid wall thickening, no evidence of metastatic disease, calcified right hilar lymph nodes and 14 mm right upper lobe nodule with adjacent tiny nodules favored to reflect prior granulomatous disease 2.  Multiple colon polyps removed on colonoscopy 10/19/2022-tubular adenomas and tubulovillous adenomas 3.  Nodular left thyroid on CT 10/31/2022 Thyroid ultrasound 11/29/2022-multinodular goiter, 3 nodules meet criteria for 1 year follow-up imaging  4.  Diabetes 5.  Hypertension 6.  Admission with GI bleeding following the colonoscopy 10/19/2022 7.  Hyperlipidemia    Disposition: Mr Nanna has been diagnosed with stage III colon cancer.  I recommend adjuvant CAPOX.  He reports Dr. Molli Knock agrees with this recommendation.  We again reviewed potential toxicities associated with the CAPOX regimen including the chance of nausea/vomiting, mucositis, diarrhea, alopecia, hematologic toxicity, infection, and bleeding.  We discussed the rash, sun sensitivity, hyperpigmentation, hand/foot syndrome, and cardiac toxicity associated with capecitabine.  We reviewed the allergic reaction and various types of neuropathy seen with oxaliplatin.  He agrees to proceed.  He attended a chemotherapy  teaching class today.  He has been referred to the genetics counselor.  He is scheduled for Port-A-Cath placement 01/08/2023.  He will return for cycle 1 CAPOX on  01/11/2023.  We will check baseline labs when he is here on 01/11/2023.  He will be scheduled for an office visit and cycle 2 CAPOX on 01/31/2023.  He reports a chronic history of tachycardia and takes metoprolol.  We will repeat his pulse prior to leaving the clinic today.    Thornton Papas, MD  01/03/2023  3:10 PM

## 2023-01-03 NOTE — Progress Notes (Signed)
PATIENT NAVIGATOR PROGRESS NOTE  Name: Brandon Ruiz Date: 01/03/2023 MRN: [id number]  DOB: 11-Jan-1976   Reason for visit:  F/U visit  Comments:  Met with Brandon Ruiz during visit with Dr Truett Perna Given Journeys notebook with disease specific information Reviewed Port placement instructions Provided chemo education as well as pillbox for Capecitabine and Udderly smooth 20 for prevention of hand/foot syndrome Given contact information to call with any questions    Time spent counseling/coordinating care: > 60 minutes

## 2023-01-03 NOTE — Telephone Encounter (Signed)
Oral Oncology Pharmacist Encounter  Received new prescription for Xeloda (capecitabine) for the adjuvant treatment of stage IIIb colon cancer in conjunction with oxaliplatin, planned duration of 3 months. Planned start 01/11/23.   BMP from 12/12/22 assessed, no relevant lab abnormalities. Prescription dose and frequency assessed.   Current medication list in Epic reviewed, no relevant DDIs with capecitabine identified.   Evaluated chart and no patient barriers to medication adherence identified.   Prescription has been e-scribed to the [facility] for benefits analysis and approval.  Oral Oncology Clinic will continue to follow for insurance authorization, copayment issues, initial counseling and start date.  Patient agreed to treatment on 01/03/23 per MD treatment plan documentation.  Remi Haggard, PharmD, BCPS, BCOP, CPP Hematology/Oncology Clinical Pharmacist Practitioner [place]/[facility]/AP Oral Chemotherapy Navigation Clinic [phone]  01/03/2023 3:58 PM

## 2023-01-04 ENCOUNTER — Telehealth: Payer: Self-pay

## 2023-01-04 ENCOUNTER — Other Ambulatory Visit (HOSPITAL_COMMUNITY): Payer: Self-pay

## 2023-01-04 ENCOUNTER — Encounter: Payer: Self-pay | Admitting: Oncology

## 2023-01-04 NOTE — Telephone Encounter (Signed)
Oral Oncology Patient Advocate Encounter  Prior Authorization for Capecitabine has been approved.    PA# [id number]  Effective dates: 01/04/23 through 01/04/24  Patients co-pay is $0.00.    Ardeen Fillers, CPhT Oncology Pharmacy Patient Advocate  [facility]  [phone] (phone) [phone] (fax) 01/04/2023 9:27 AM

## 2023-01-04 NOTE — Telephone Encounter (Signed)
Oral Oncology Patient Advocate Encounter  New authorization   Received notification that prior authorization for Capecitabine is required.   PA submitted verbally to [employer] at [phone] on 01/04/23  Chart Notes faxed to [employer] at [phone]  Case ID [id number]  Status is pending     Ardeen Fillers, CPhT Oncology Pharmacy Patient Advocate  [facility]  [phone] (phone) [phone] (fax) 01/04/2023 8:52 AM

## 2023-01-04 NOTE — Progress Notes (Signed)
Pharmacist Chemotherapy Monitoring - Initial Assessment    Anticipated start date: 01/11/23    The following has been reviewed per standard work regarding the patient's treatment regimen: The patient's diagnosis, treatment plan and drug doses, and organ/hematologic function Lab orders and baseline tests specific to treatment regimen  The treatment plan start date, drug sequencing, and pre-medications Prior authorization status  Patient's documented medication list, including drug-drug interaction screen and prescriptions for anti-emetics and supportive care specific to the treatment regimen The drug concentrations, fluid compatibility, administration routes, and timing of the medications to be used The patient's access for treatment and lifetime cumulative dose history, if applicable  The patient's medication allergies and previous infusion related reactions, if applicable   Changes made to treatment plan:  N/A  Follow up needed:  Pending authorization for treatment    Daylene Katayama, [facility], 01/04/2023  12:24 PM

## 2023-01-05 ENCOUNTER — Other Ambulatory Visit (HOSPITAL_COMMUNITY): Payer: Self-pay

## 2023-01-05 ENCOUNTER — Other Ambulatory Visit: Payer: Self-pay | Admitting: Student

## 2023-01-05 ENCOUNTER — Other Ambulatory Visit: Payer: Self-pay

## 2023-01-05 DIAGNOSIS — C189 Malignant neoplasm of colon, unspecified: Secondary | ICD-10-CM

## 2023-01-05 NOTE — Telephone Encounter (Signed)
Oral Chemotherapy Pharmacist Encounter  [facility] will deliver medication to patient on 01/09/23. Patient knows to start on 01/11/23.  Patient Education I spoke with patient for overview of new oral chemotherapy medication: Xeloda (capecitabine) for the adjuvant treatment of stage IIIb colon cancer in conjunction with oxaliplatin, planned duration of 3 months. Planned start 01/11/23.   Counseled patient on administration, dosing, side effects, monitoring, drug-food interactions, safe handling, storage, and disposal. Patient will take 5 tablets (2500mg ) by mouth in AM and 4 tablets (2000mg ) in PM. Take with food. Take for 14 days, then hold for 7 days repeat every 21 days. .  Side effects include but not limited to: diarrhea, hand-foot syndrome, mouth sores, edema, decreased wbc, fatigue, N/V Diarrhea: PAtient knows to use loperamide as needed and to call the office if he is having 4 or more loose stools per day Hand-foot syndrome: Recommended use of Udderly Smooth Extra Care 20 Mouth sores: patient knows to request magic mouthwash if needed  Reviewed with patient importance of keeping a medication schedule and plan for any missed doses.  After discussion with patient no patient barriers to medication adherence identified.   Mr. Pines voiced understanding and appreciation. All questions answered. Medication handout provided.  Provided patient with Oral Chemotherapy Navigation Clinic phone number. Patient knows to call the office with questions or concerns. Oral Chemotherapy Navigation Clinic will continue to follow.  Remi Haggard, PharmD, BCPS, BCOP, CPP Hematology/Oncology Clinical Pharmacist Practitioner [place]/DB/AP Oral Chemotherapy Navigation Clinic [phone]  01/05/2023 9:33 AM

## 2023-01-05 NOTE — Telephone Encounter (Signed)
Patient successfully OnBoarded and drug education provided by pharmacist. Medication scheduled to be shipped on 01/08/23 for delivery on 01/09/23 from [facility] to patient's address. Patient also knows to call me at [phone] with any questions or concerns regarding receiving medication or if there is any unexpected change in co-pay.    Ardeen Fillers, CPhT Oncology Pharmacy Patient Advocate  [facility]  [phone] (phone) [phone] (fax) 01/05/2023 9:48 AM

## 2023-01-07 ENCOUNTER — Other Ambulatory Visit: Payer: Self-pay | Admitting: Oncology

## 2023-01-08 ENCOUNTER — Other Ambulatory Visit: Payer: Self-pay

## 2023-01-08 ENCOUNTER — Ambulatory Visit (HOSPITAL_COMMUNITY)
Admission: RE | Admit: 2023-01-08 | Discharge: 2023-01-08 | Disposition: A | Discharge status: Home / Self Care | Payer: Managed Care, Other (non HMO) | Source: Ambulatory Visit | Attending: Oncology | Admitting: Oncology

## 2023-01-08 DIAGNOSIS — D649 Anemia, unspecified: Secondary | ICD-10-CM | POA: Diagnosis not present

## 2023-01-08 DIAGNOSIS — C187 Malignant neoplasm of sigmoid colon: Secondary | ICD-10-CM | POA: Diagnosis present

## 2023-01-08 DIAGNOSIS — E119 Type 2 diabetes mellitus without complications: Secondary | ICD-10-CM | POA: Insufficient documentation

## 2023-01-08 DIAGNOSIS — Z7984 Long term (current) use of oral hypoglycemic drugs: Secondary | ICD-10-CM | POA: Diagnosis not present

## 2023-01-08 DIAGNOSIS — I10 Essential (primary) hypertension: Secondary | ICD-10-CM | POA: Diagnosis not present

## 2023-01-08 DIAGNOSIS — C189 Malignant neoplasm of colon, unspecified: Secondary | ICD-10-CM

## 2023-01-08 DIAGNOSIS — Z794 Long term (current) use of insulin: Secondary | ICD-10-CM | POA: Insufficient documentation

## 2023-01-08 DIAGNOSIS — E785 Hyperlipidemia, unspecified: Secondary | ICD-10-CM | POA: Diagnosis not present

## 2023-01-08 HISTORY — PX: IR IMAGING GUIDED PORT INSERTION: IMG5740

## 2023-01-08 LAB — GLUCOSE, CAPILLARY: Glucose-Capillary: 185 mg/dL — ABNORMAL HIGH (ref 70–99)

## 2023-01-08 MED ORDER — HEPARIN SOD (PORK) LOCK FLUSH 100 UNIT/ML IV SOLN
500.0000 [IU] | Freq: Once | INTRAVENOUS | Status: AC
  Administered 2023-01-08: 500 [IU] via INTRAVENOUS

## 2023-01-08 MED ORDER — SODIUM CHLORIDE 0.9 % IV SOLN: INTRAVENOUS | Status: DC

## 2023-01-08 MED ORDER — FENTANYL CITRATE (PF) 100 MCG/2ML IJ SOLN
INTRAMUSCULAR | Status: AC
  Filled 2023-01-08: qty 2

## 2023-01-08 MED ORDER — HEPARIN SOD (PORK) LOCK FLUSH 100 UNIT/ML IV SOLN
INTRAVENOUS | Status: AC
  Filled 2023-01-08: qty 5

## 2023-01-08 MED ORDER — FENTANYL CITRATE (PF) 100 MCG/2ML IJ SOLN
INTRAMUSCULAR | Status: AC | PRN
  Administered 2023-01-08 (×2): 50 ug via INTRAVENOUS

## 2023-01-08 MED ORDER — MIDAZOLAM HCL 2 MG/2ML IJ SOLN
INTRAMUSCULAR | Status: AC
  Filled 2023-01-08: qty 2

## 2023-01-08 MED ORDER — LIDOCAINE-EPINEPHRINE 1 %-1:100000 IJ SOLN
INTRAMUSCULAR | Status: AC
  Filled 2023-01-08: qty 1

## 2023-01-08 MED ORDER — MIDAZOLAM HCL 2 MG/2ML IJ SOLN
INTRAMUSCULAR | Status: AC | PRN
  Administered 2023-01-08: 2 mg via INTRAVENOUS
  Administered 2023-01-08: 1 mg via INTRAVENOUS

## 2023-01-08 MED ORDER — LIDOCAINE-EPINEPHRINE (PF) 1.5 %-1:200000 IJ SOLN
20.0000 mL | Freq: Once | INTRAMUSCULAR | Status: AC
  Administered 2023-01-08: 20 mL
  Filled 2023-01-08: qty 20

## 2023-01-08 NOTE — H&P (Signed)
Chief Complaint: Need for Chemotherapy access, Portacath placement.   Referring Physician(s): Ladene Artist  Supervising Physician: Malachy Moan  Patient Status: [facility] - Out-pt  History of Present Illness: Brandon Ruiz is a 47 y.o. male outpatient. History of  HTN,HLD, DM, amenia. 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 Team is requesting a portacath for chemotherapy access.  Currently without any significant complaints. Patient alert and laying in bed,calm. Denies any fevers, headache, chest pain, SOB, cough, abdominal pain, nausea, vomiting or bleeding. Return precautions and treatment recommendations and follow-up discussed with the patient  who is agreeable with the plan.    Past Medical History:  Diagnosis Date   Anemia    Diabetes mellitus without complication (HCC)    Hyperlipidemia    Hypertension    RBBB     Past Surgical History:  Procedure Laterality Date   APPENDECTOMY     COLONOSCOPY     INCISION AND DRAINAGE OF WOUND Right 05/22/2022   Procedure: IRRIGATION AND DEBRIDEMENT RIGHT GROIN;  Surgeon: Kinsinger, De Blanch, MD;  Location: [facility];  Service: General;  Laterality: Right;   TONSILLECTOMY      Allergies: Penicillins  Medications: Prior to Admission medications   Medication Sig Start Date End Date Taking? Authorizing Provider  acetaminophen (TYLENOL) 500 MG tablet Take 1,000 mg by mouth every 6 (six) hours as needed for moderate pain.    [provider]  capecitabine (XELODA) 500 MG tablet Take 5 tablets (2500mg ) by mouth in AM and 4 tablets (2000mg ) in PM. Take with food. Take for 14 days, then hold for 7 days repeat every 21 days. 01/11/23   Ladene Artist, MD  empagliflozin (JARDIANCE) 10 MG TABS tablet Take 1 tablet (10 mg total) by mouth daily before breakfast. 11/01/22   Etta Grandchild, MD  Insulin Glargine [facility]) 100 UNIT/ML Inject 30 Units into the skin at bedtime. Patient taking  differently: Inject 20 Units into the skin daily. 11/01/22 01/30/23  Etta Grandchild, MD  lidocaine-prilocaine (EMLA) cream Apply 1 Application topically as needed (Apply to Port 1-2 hours prior to coming to cancer center). 01/03/23   Ladene Artist, MD  metFORMIN (GLUCOPHAGE) 1000 MG tablet Take 1 tablet by mouth twice daily with a meal 10/29/22   Etta Grandchild, MD  metoprolol succinate (TOPROL-XL) 25 MG 24 hr tablet Take 1 tablet (25 mg total) by mouth daily. 11/01/22   Etta Grandchild, MD  olmesartan (BENICAR) 20 MG tablet TAKE ONE TABLET BY MOUTH ONE TIME DAILY 10/29/22   Etta Grandchild, MD  ondansetron (ZOFRAN) 8 MG tablet Take 1 tablet (8 mg total) by mouth every 8 (eight) hours as needed (Starting 3 days after Oxaliplatin as needed for nausea). 01/03/23   Ladene Artist, MD  prochlorperazine (COMPAZINE) 10 MG tablet Take 1 tablet (10 mg total) by mouth every 6 (six) hours as needed for nausea or vomiting. 01/03/23   Ladene Artist, MD  rosuvastatin (CRESTOR) 10 MG tablet Take 1 tablet (10 mg total) by mouth daily. 08/03/22   Etta Grandchild, MD     Family History  Problem Relation Age of Onset   Lung cancer Father    Colon polyps Neg Hx    Colon cancer Neg Hx    Esophageal cancer Neg Hx    Stomach cancer Neg Hx    Rectal cancer Neg Hx     Social History   Socioeconomic History   Marital status: Single  Spouse name: Not on file   Number of children: Not on file   Years of education: Not on file   Highest education level: Not on file  Occupational History   Not on file  Tobacco Use   Smoking status: Never   Smokeless tobacco: Never  Vaping Use   Vaping status: Never Used  Substance and Sexual Activity   Alcohol use: Yes    Alcohol/week: 3.0 standard drinks of alcohol    Types: 3 Cans of beer per week   Drug use: Never   Sexual activity: Yes    Partners: Female  Other Topics Concern   Not on file  Social History Narrative   Not on file   Social Determinants of Health    Financial Resource Strain: Not on file  Food Insecurity: Low Risk  (01/02/2023)   [facility]   Food vital sign    Within the past 12 months, you worried that your food would run out before you got money to buy more: Never true    Within the past 12 months, the food you bought just didn't last and you didn't have money to get more. : Never true  Transportation Needs: No Transportation Needs (01/02/2023)   [facility]   Transportation    In the past 12 months, has lack of reliable transportation kept you from medical appointments, meetings, work or from getting things needed for daily living? : No  Physical Activity: Not on file  Stress: Not on file  Social Connections: Unknown (11/08/2021)   [facility] from [facility]   Social Network    Social Network: Not on file     Review of Systems: A 12 point ROS discussed and pertinent positives are indicated in the HPI above.  All other systems are negative.  Review of Systems  Constitutional:  Negative for fever.  HENT:  Negative for congestion.   Respiratory:  Negative for cough and shortness of breath.   Cardiovascular:  Negative for chest pain.  Gastrointestinal:  Negative for abdominal pain.  Neurological:  Negative for headaches.  Psychiatric/Behavioral:  Negative for behavioral problems and confusion.     Vital Signs: There were no vitals taken for this visit.    Physical Exam Vitals and nursing note reviewed.  Constitutional:      Appearance: He is well-developed.  HENT:     Head: Normocephalic.  Cardiovascular:     Heart sounds: Normal heart sounds.  Pulmonary:     Effort: Pulmonary effort is normal.  Musculoskeletal:        General: Normal range of motion.     Cervical back: Normal range of motion.  Skin:    General: Skin is warm and dry.  Neurological:     General: No focal deficit present.     Mental Status: He is alert and oriented to person, place,  and time.  Psychiatric:        Mood and Affect: Mood normal.        Behavior: Behavior normal.        Thought Content: Thought content normal.        Judgment: Judgment normal.     Imaging: No results found.  Labs:  CBC: Recent Labs    10/20/22 0417 10/20/22 1028 10/21/22 1010 11/01/22 0903 11/29/22 0822 12/12/22 0725  WBC 13.1*  --   --  9.9 6.9 9.3  HGB 11.5*   < > 11.0* 14.1 15.1 13.0  HCT 33.8*   < > 32.8* 41.3 45.7 38.6*  PLT 212  --   --  390.0 284 234   < > = values in this interval not displayed.    COAGS: Recent Labs    05/22/22 1034 10/19/22 1752  INR 1.1 1.1  APTT 27 24    BMP: Recent Labs    10/19/22 1739 10/20/22 0417 11/29/22 0822 12/11/22 1615 12/12/22 0725  NA 137 136 138  --  137  K 3.5 3.7 4.1  --  3.4*  CL 105 103 106  --  107  CO2 22 25 22   --  24  GLUCOSE 182* 129* 150*  --  146*  BUN 13 12 15   --  6  CALCIUM 8.9 8.5* 9.3  --  8.8*  CREATININE 0.84 0.78 0.73 0.76 0.69  GFRNONAA >60 >60 >60 >60 >60    LIVER FUNCTION TESTS: Recent Labs    05/22/22 1034 10/19/22 1739 10/20/22 0417  BILITOT 1.2 1.3* 1.3*  AST 16 14* 12*  ALT 27 15 14   ALKPHOS 98 70 52  PROT 7.4 6.7 5.7*  ALBUMIN 4.0 4.1 3.2*    TUMOR MARKERS: Recent Labs    10/19/22 1406  CEA <2.0    Assessment and Plan:  47 y.o. male outpatient. History of  HTN,HLD, DM, amenia. 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 Team is requesting a portacath for chemotherapy access.  Labs from 6.18.24 unremarkable. All medications are within acceptable parameters. Allergies include PCN. Patient has been NPO been since midnight.   Risks and benefits of image guided port-a-catheter placement was discussed with the patient including, but not limited to bleeding, infection, pneumothorax, or fibrin sheath development and need for additional procedures.  All of the patient's questions were answered, patient is agreeable to proceed. Consent signed  and in chart.   Thank you for this interesting consult.  I greatly enjoyed meeting Seydina Holliman and look forward to participating in their care.  A copy of this report was sent to the requesting provider on this date.  Electronically Signed: Alene Mires, NP 01/08/2023, 9:12 AM   I spent a total of  30 Minutes   in face to face in clinical consultation, greater than 50% of which was counseling/coordinating care for portacath placement

## 2023-01-08 NOTE — Procedures (Signed)
 Interventional Radiology Procedure Note  Procedure: Placement of a right IJ approach single lumen PowerPort.  Tip is positioned at the superior cavoatrial junction and catheter is ready for immediate use.  Complications: No immediate Recommendations:  - Ok to shower tomorrow - Do not submerge for 7 days - Routine line care   Signed,  Heath K. McCullough, MD   

## 2023-01-09 ENCOUNTER — Ambulatory Visit: Payer: Managed Care, Other (non HMO) | Admitting: Oncology

## 2023-01-09 ENCOUNTER — Inpatient Hospital Stay: Payer: Managed Care, Other (non HMO) | Admitting: Licensed Clinical Social Worker

## 2023-01-09 NOTE — Progress Notes (Signed)
[facility] Clinical Social Work  Initial Assessment   Brandon Ruiz is a 47 y.o. year old male contacted by phone. Clinical Social Work was referred by nurse navigator for assessment of psychosocial needs.   SDOH (Social Determinants of Health) assessments performed: Yes   SDOH Screenings   Food Insecurity: Low Risk  (01/02/2023)   [facility]  Housing: Low Risk  (12/11/2022)  Transportation Needs: No Transportation Needs (01/02/2023)   [facility]  Utilities: Low Risk  (01/02/2023)   [facility]  Depression (PHQ2-9): Low Risk  (08/03/2022)  Social Connections: Unknown (11/08/2021)   Received from [facility]  Tobacco Use: Low Risk  (01/03/2023)   [facility]     Distress Screen completed: No     No data to display            Family/Social Information:  Housing Arrangement: patient lives with his girlfriend. Family members/support persons in your life? Family and Friends Transportation concerns: no  Employment: Out on work excuse.  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  Income source: Short-Term Disability Financial concerns: Yes, due to illness and/or loss of work during treatment Type of concern: Rent/ mortgage Food access concerns: no Religious or spiritual practice: No Services Currently in place:  [employer]  Coping/ Adjustment to diagnosis: Patient understands treatment plan and what happens next? yes Concerns about diagnosis and/or treatment: Embarrassment Patient reported stressors: Finances Hopes and/or priorities: His health. Patient enjoys time with family/ friends Current coping skills/ strengths: Average or above average intelligence , Capable of independent living , Communication skills , General fund of knowledge , Motivation for treatment/growth ,  Physical Health , and Supportive family/friends     SUMMARY: Current SDOH Barriers:  Financial constraints related to inability to work.  Clinical Social Work Clinical Goal(s):  Scientist, research (life sciences) options for unmet needs related to:  Financial Strain   Interventions: Discussed common feeling and emotions when being diagnosed with cancer, and the importance of support during treatment Informed patient of the support team roles and support services at [facility] Provided CSW contact information and encouraged patient to call with any questions or concerns Provided patient with information about the [employer].  Made referral to Merla Riches for the [employer].  Mailed patient program brochure.   Follow Up Plan: Patient will contact CSW with any support or resource needs Patient verbalizes understanding of plan: Yes    Dorothey Baseman, LCSW Clinical Social Worker [facility]

## 2023-01-11 ENCOUNTER — Inpatient Hospital Stay: Payer: Managed Care, Other (non HMO)

## 2023-01-11 ENCOUNTER — Ambulatory Visit: Payer: Managed Care, Other (non HMO) | Admitting: Oncology

## 2023-01-11 VITALS — BP 116/57 | HR 91 | Temp 98.2°F | Resp 18 | Ht 76.5 in | Wt 248.0 lb

## 2023-01-11 DIAGNOSIS — Z95828 Presence of other vascular implants and grafts: Secondary | ICD-10-CM | POA: Insufficient documentation

## 2023-01-11 DIAGNOSIS — C187 Malignant neoplasm of sigmoid colon: Secondary | ICD-10-CM

## 2023-01-11 DIAGNOSIS — Z5111 Encounter for antineoplastic chemotherapy: Secondary | ICD-10-CM | POA: Diagnosis not present

## 2023-01-11 LAB — CBC WITH DIFFERENTIAL (CANCER CENTER ONLY)
Abs Immature Granulocytes: 0.02 10*3/uL (ref 0.00–0.07)
Basophils Absolute: 0.1 10*3/uL (ref 0.0–0.1)
Basophils Relative: 1 %
Eosinophils Absolute: 0.3 10*3/uL (ref 0.0–0.5)
Eosinophils Relative: 4 %
HCT: 48.4 % (ref 39.0–52.0)
Hemoglobin: 16 g/dL (ref 13.0–17.0)
Immature Granulocytes: 0 %
Lymphocytes Relative: 29 %
Lymphs Abs: 2.1 10*3/uL (ref 0.7–4.0)
MCH: 28.7 pg (ref 26.0–34.0)
MCHC: 33.1 g/dL (ref 30.0–36.0)
MCV: 86.7 fL (ref 80.0–100.0)
Monocytes Absolute: 0.6 10*3/uL (ref 0.1–1.0)
Monocytes Relative: 9 %
Neutro Abs: 4 10*3/uL (ref 1.7–7.7)
Neutrophils Relative %: 57 %
Platelet Count: 265 10*3/uL (ref 150–400)
RBC: 5.58 MIL/uL (ref 4.22–5.81)
RDW: 13.3 % (ref 11.5–15.5)
WBC Count: 7 10*3/uL (ref 4.0–10.5)
nRBC: 0 % (ref 0.0–0.2)

## 2023-01-11 LAB — CMP (CANCER CENTER ONLY)
ALT: 24 U/L (ref 0–44)
AST: 16 U/L (ref 15–41)
Albumin: 4.6 g/dL (ref 3.5–5.0)
Alkaline Phosphatase: 113 U/L (ref 38–126)
Anion gap: 9 (ref 5–15)
BUN: 15 mg/dL (ref 6–20)
CO2: 28 mmol/L (ref 22–32)
Calcium: 10.2 mg/dL (ref 8.9–10.3)
Chloride: 104 mmol/L (ref 98–111)
Creatinine: 0.81 mg/dL (ref 0.61–1.24)
GFR, Estimated: >60 mL/min (ref >60)
Glucose, Bld: 207 mg/dL — ABNORMAL HIGH (ref 70–99)
Potassium: 4 mmol/L (ref 3.5–5.1)
Sodium: 141 mmol/L (ref 135–145)
Total Bilirubin: 0.8 mg/dL (ref 0.3–1.2)
Total Protein: 7.8 g/dL (ref 6.5–8.1)

## 2023-01-11 MED ORDER — OXALIPLATIN CHEMO INJECTION 100 MG/20ML
130.0000 mg/m2 | Freq: Once | INTRAVENOUS | Status: AC
  Administered 2023-01-11: 300 mg via INTRAVENOUS
  Filled 2023-01-11: qty 60

## 2023-01-11 MED ORDER — SODIUM CHLORIDE 0.9% FLUSH
10.0000 mL | INTRAVENOUS | Status: DC | PRN
  Administered 2023-01-11: 10 mL

## 2023-01-11 MED ORDER — PALONOSETRON HCL INJECTION 0.25 MG/5ML
0.2500 mg | Freq: Once | INTRAVENOUS | Status: AC
  Administered 2023-01-11: 0.25 mg via INTRAVENOUS
  Filled 2023-01-11: qty 5

## 2023-01-11 MED ORDER — HEPARIN SOD (PORK) LOCK FLUSH 100 UNIT/ML IV SOLN
500.0000 [IU] | Freq: Once | INTRAVENOUS | Status: AC | PRN
  Administered 2023-01-11: 500 [IU]

## 2023-01-11 MED ORDER — SODIUM CHLORIDE 0.9 % IV SOLN
10.0000 mg | Freq: Once | INTRAVENOUS | Status: AC
  Administered 2023-01-11: 10 mg via INTRAVENOUS
  Filled 2023-01-11: qty 10

## 2023-01-11 MED ORDER — SODIUM CHLORIDE 0.9% FLUSH
10.0000 mL | Freq: Once | INTRAVENOUS | Status: AC
  Administered 2023-01-11: 10 mL

## 2023-01-11 MED ORDER — DEXTROSE 5 % IV SOLN: Freq: Once | INTRAVENOUS | Status: AC

## 2023-01-11 NOTE — Patient Instructions (Signed)
[place] [facility]   Discharge Instructions: Thank you for choosing [facility] to provide your oncology and hematology care.   If you have a lab appointment with the Cancer Center, please go directly to the Cancer Center and check in at the registration area.   Wear comfortable clothing and clothing appropriate for easy access to any Portacath or PICC line.   We strive to give you quality time with your provider. You may need to reschedule your appointment if you arrive late (15 or more minutes).  Arriving late affects you and other patients whose appointments are after yours.  Also, if you miss three or more appointments without notifying the office, you may be dismissed from the clinic at the provider's discretion.      For prescription refill requests, have your pharmacy contact our office and allow 72 hours for refills to be completed.    Today you received the following chemotherapy and/or immunotherapy agents Oxaliplatin.      To help prevent nausea and vomiting after your treatment, we encourage you to take your nausea medication as directed.  BELOW ARE SYMPTOMS THAT SHOULD BE REPORTED IMMEDIATELY: *FEVER GREATER THAN 100.4 F (38 C) OR HIGHER *CHILLS OR SWEATING *NAUSEA AND VOMITING THAT IS NOT CONTROLLED WITH YOUR NAUSEA MEDICATION *UNUSUAL SHORTNESS OF BREATH *UNUSUAL BRUISING OR BLEEDING *URINARY PROBLEMS (pain or burning when urinating, or frequent urination) *BOWEL PROBLEMS (unusual diarrhea, constipation, pain near the anus) TENDERNESS IN MOUTH AND THROAT WITH OR WITHOUT PRESENCE OF ULCERS (sore throat, sores in mouth, or a toothache) UNUSUAL RASH, SWELLING OR PAIN  UNUSUAL VAGINAL DISCHARGE OR ITCHING   Items with * indicate a potential emergency and should be followed up as soon as possible or go to the Emergency Department if any problems should occur.  Please show the CHEMOTHERAPY ALERT CARD or IMMUNOTHERAPY ALERT CARD at  check-in to the Emergency Department and triage nurse.  Should you have questions after your visit or need to cancel or reschedule your appointment, please contact [place] [facility]  Dept: [phone]  and follow the prompts.  Office hours are 8:00 a.m. to 4:30 p.m. Monday - Friday. Please note that voicemails left after 4:00 p.m. may not be returned until the following business day.  We are closed weekends and major holidays. You have access to a nurse at all times for urgent questions. Please call the main number to the clinic Dept: [phone] and follow the prompts.   For any non-urgent questions, you may also contact your provider using MyChart. We now offer e-Visits for anyone 54 and older to request care online for non-urgent symptoms. For details visit [email or web address].   Also download the MyChart app! Go to the app store, search "MyChart", open the app, select [place], and log in with your MyChart username and password.  Oxaliplatin Injection What is this medication? OXALIPLATIN (ox AL i PLA tin) treats colorectal cancer. It works by slowing down the growth of cancer cells. This medicine may be used for other purposes; ask your health care provider or pharmacist if you have questions. COMMON BRAND NAME(S): Eloxatin What should I tell my care team before I take this medication? They need to know if you have any of these conditions: Heart disease History of irregular heartbeat or rhythm Liver disease Low blood cell levels (white cells, red cells, and platelets) Lung or breathing disease, such as asthma Take medications that treat or prevent blood clots Tingling  of the fingers, toes, or other nerve disorder An unusual or allergic reaction to oxaliplatin, other medications, foods, dyes, or preservatives If you or your partner are pregnant or trying to get pregnant Breast-feeding How should I use this medication? This medication is injected  into a vein. It is given by your care team in a hospital or clinic setting. Talk to your care team about the use of this medication in children. Special care may be needed. Overdosage: If you think you have taken too much of this medicine contact a poison control center or emergency room at once. NOTE: This medicine is only for you. Do not share this medicine with others. What if I miss a dose? Keep appointments for follow-up doses. It is important not to miss a dose. Call your care team if you are unable to keep an appointment. What may interact with this medication? Do not take this medication with any of the following: Cisapride Dronedarone Pimozide Thioridazine This medication may also interact with the following: Aspirin and aspirin-like medications Certain medications that treat or prevent blood clots, such as warfarin, apixaban, dabigatran, and rivaroxaban Cisplatin Cyclosporine Diuretics Medications for infection, such as acyclovir, adefovir, amphotericin B, bacitracin, cidofovir, foscarnet, ganciclovir, gentamicin, pentamidine, vancomycin NSAIDs, medications for pain and inflammation, such as ibuprofen or naproxen Other medications that cause heart rhythm changes Pamidronate Zoledronic acid This list may not describe all possible interactions. Give your health care provider a list of all the medicines, herbs, non-prescription drugs, or dietary supplements you use. Also tell them if you smoke, drink alcohol, or use illegal drugs. Some items may interact with your medicine. What should I watch for while using this medication? Your condition will be monitored carefully while you are receiving this medication. You may need blood work while taking this medication. This medication may make you feel generally unwell. This is not uncommon as chemotherapy can affect healthy cells as well as cancer cells. Report any side effects. Continue your course of treatment even though you feel ill  unless your care team tells you to stop. This medication may increase your risk of getting an infection. Call your care team for advice if you get a fever, chills, sore throat, or other symptoms of a cold or flu. Do not treat yourself. Try to avoid being around people who are sick. Avoid taking medications that contain aspirin, acetaminophen, ibuprofen, naproxen, or ketoprofen unless instructed by your care team. These medications may hide a fever. Be careful brushing or flossing your teeth or using a toothpick because you may get an infection or bleed more easily. If you have any dental work done, tell your dentist you are receiving this medication. This medication can make you more sensitive to cold. Do not drink cold drinks or use ice. Cover exposed skin before coming in contact with cold temperatures or cold objects. When out in cold weather wear warm clothing and cover your mouth and nose to warm the air that goes into your lungs. Tell your care team if you get sensitive to the cold. Talk to your care team if you or your partner are pregnant or think either of you might be pregnant. This medication can cause serious birth defects if taken during pregnancy and for 9 months after the last dose. A negative pregnancy test is required before starting this medication. A reliable form of contraception is recommended while taking this medication and for 9 months after the last dose. Talk to your care team about effective forms of  contraception. Do not father a child while taking this medication and for 6 months after the last dose. Use a condom while having sex during this time period. Do not breastfeed while taking this medication and for 3 months after the last dose. This medication may cause infertility. Talk to your care team if you are concerned about your fertility. What side effects may I notice from receiving this medication? Side effects that you should report to your care team as soon as  possible: Allergic reactions--skin rash, itching, hives, swelling of the face, lips, tongue, or throat Bleeding--bloody or black, tar-like stools, vomiting blood or brown material that looks like coffee grounds, red or dark brown urine, small red or purple spots on skin, unusual bruising or bleeding Dry cough, shortness of breath or trouble breathing Heart rhythm changes--fast or irregular heartbeat, dizziness, feeling faint or lightheaded, chest pain, trouble breathing Infection--fever, chills, cough, sore throat, wounds that don't heal, pain or trouble when passing urine, general feeling of discomfort or being unwell Liver injury--right upper belly pain, loss of appetite, nausea, light-colored stool, dark yellow or brown urine, yellowing skin or eyes, unusual weakness or fatigue Low red blood cell level--unusual weakness or fatigue, dizziness, headache, trouble breathing Muscle injury--unusual weakness or fatigue, muscle pain, dark yellow or brown urine, decrease in amount of urine Pain, tingling, or numbness in the hands or feet Sudden and severe headache, confusion, change in vision, seizures, which may be signs of posterior reversible encephalopathy syndrome (PRES) Unusual bruising or bleeding Side effects that usually do not require medical attention (report to your care team if they continue or are bothersome): Diarrhea Nausea Pain, redness, or swelling with sores inside the mouth or throat Unusual weakness or fatigue Vomiting This list may not describe all possible side effects. Call your doctor for medical advice about side effects. You may report side effects to FDA at 1-800-FDA-1088. Where should I keep my medication? This medication is given in a hospital or clinic. It will not be stored at home. NOTE: This sheet is a summary. It may not cover all possible information. If you have questions about this medicine, talk to your doctor, pharmacist, or health care provider.  2024  Elsevier/Gold Standard (2022-08-20 00:00:00)

## 2023-01-12 ENCOUNTER — Telehealth: Payer: Self-pay

## 2023-01-12 NOTE — Telephone Encounter (Signed)
Called patient for first time chemo follow-up.  Patient denied any nausea, but stated he has noticed some cold sensitivity.  Reminded patient that the cold sensitivity can last 5-7 days after chemo but should lessen each day.  Patient instructed to contact office with any questions or concerns.

## 2023-01-17 ENCOUNTER — Other Ambulatory Visit: Payer: Self-pay | Admitting: Internal Medicine

## 2023-01-17 DIAGNOSIS — I10 Essential (primary) hypertension: Secondary | ICD-10-CM

## 2023-01-17 DIAGNOSIS — E1129 Type 2 diabetes mellitus with other diabetic kidney complication: Secondary | ICD-10-CM

## 2023-01-17 DIAGNOSIS — E785 Hyperlipidemia, unspecified: Secondary | ICD-10-CM

## 2023-01-18 ENCOUNTER — Other Ambulatory Visit (HOSPITAL_COMMUNITY): Payer: Self-pay

## 2023-01-25 ENCOUNTER — Encounter: Payer: Self-pay | Admitting: Internal Medicine

## 2023-01-25 ENCOUNTER — Other Ambulatory Visit: Payer: Self-pay | Admitting: Internal Medicine

## 2023-01-25 DIAGNOSIS — I10 Essential (primary) hypertension: Secondary | ICD-10-CM

## 2023-01-25 DIAGNOSIS — E119 Type 2 diabetes mellitus without complications: Secondary | ICD-10-CM

## 2023-01-25 DIAGNOSIS — E785 Hyperlipidemia, unspecified: Secondary | ICD-10-CM

## 2023-01-25 DIAGNOSIS — R809 Proteinuria, unspecified: Secondary | ICD-10-CM

## 2023-01-25 MED ORDER — OLMESARTAN MEDOXOMIL 20 MG PO TABS
20.0000 mg | ORAL_TABLET | Freq: Every day | ORAL | 0 refills | Status: DC
Start: 2023-01-25 — End: 2023-04-14

## 2023-01-25 MED ORDER — BASAGLAR KWIKPEN 100 UNIT/ML ~~LOC~~ SOPN
20.0000 [IU] | PEN_INJECTOR | Freq: Every day | SUBCUTANEOUS | 0 refills | Status: DC
Start: 2023-01-25 — End: 2023-04-05

## 2023-01-25 MED ORDER — METFORMIN HCL 1000 MG PO TABS: 1000.0000 mg | ORAL_TABLET | Freq: Two times a day (BID) | ORAL | 0 refills | Status: DC

## 2023-01-25 MED ORDER — ROSUVASTATIN CALCIUM 10 MG PO TABS
10.0000 mg | ORAL_TABLET | Freq: Every day | ORAL | 0 refills | Status: DC
Start: 2023-01-25 — End: 2023-04-14

## 2023-01-25 MED ORDER — EMPAGLIFLOZIN 10 MG PO TABS
10.0000 mg | ORAL_TABLET | Freq: Every day | ORAL | 0 refills | Status: DC
Start: 2023-01-25 — End: 2023-04-14

## 2023-01-28 ENCOUNTER — Other Ambulatory Visit: Payer: Self-pay | Admitting: Oncology

## 2023-01-30 ENCOUNTER — Other Ambulatory Visit: Payer: Self-pay | Admitting: Oncology

## 2023-01-30 ENCOUNTER — Other Ambulatory Visit: Payer: Self-pay

## 2023-01-30 ENCOUNTER — Other Ambulatory Visit (HOSPITAL_COMMUNITY): Payer: Self-pay

## 2023-01-30 DIAGNOSIS — C187 Malignant neoplasm of sigmoid colon: Secondary | ICD-10-CM

## 2023-01-30 MED ORDER — CAPECITABINE 500 MG PO TABS
ORAL_TABLET | ORAL | 0 refills | Status: DC
Start: 2023-01-30 — End: 2023-02-15
  Filled 2023-01-30: qty 126, 21d supply, fill #0

## 2023-01-31 ENCOUNTER — Other Ambulatory Visit: Payer: Self-pay

## 2023-01-31 ENCOUNTER — Inpatient Hospital Stay: Payer: Managed Care, Other (non HMO)

## 2023-01-31 ENCOUNTER — Inpatient Hospital Stay: Payer: Managed Care, Other (non HMO) | Attending: Oncology

## 2023-01-31 ENCOUNTER — Other Ambulatory Visit (HOSPITAL_COMMUNITY): Payer: Self-pay

## 2023-01-31 ENCOUNTER — Encounter: Payer: Self-pay | Admitting: *Deleted

## 2023-01-31 ENCOUNTER — Inpatient Hospital Stay (HOSPITAL_BASED_OUTPATIENT_CLINIC_OR_DEPARTMENT_OTHER): Payer: Managed Care, Other (non HMO) | Admitting: Oncology

## 2023-01-31 VITALS — BP 163/111 | HR 96 | Resp 20

## 2023-01-31 VITALS — BP 151/109 | HR 95 | Temp 97.9°F | Resp 20 | Ht 76.5 in | Wt 256.0 lb

## 2023-01-31 DIAGNOSIS — C187 Malignant neoplasm of sigmoid colon: Secondary | ICD-10-CM

## 2023-01-31 DIAGNOSIS — Z5111 Encounter for antineoplastic chemotherapy: Secondary | ICD-10-CM | POA: Diagnosis not present

## 2023-01-31 LAB — CMP (CANCER CENTER ONLY)
ALT: 26 U/L (ref 0–44)
AST: 19 U/L (ref 15–41)
Albumin: 4.3 g/dL (ref 3.5–5.0)
Alkaline Phosphatase: 100 U/L (ref 38–126)
Anion gap: 8 (ref 5–15)
BUN: 11 mg/dL (ref 6–20)
CO2: 27 mmol/L (ref 22–32)
Calcium: 9.5 mg/dL (ref 8.9–10.3)
Chloride: 105 mmol/L (ref 98–111)
Creatinine: 0.65 mg/dL (ref 0.61–1.24)
GFR, Estimated: >60 mL/min (ref >60)
Glucose, Bld: 173 mg/dL — ABNORMAL HIGH (ref 70–99)
Potassium: 3.9 mmol/L (ref 3.5–5.1)
Sodium: 140 mmol/L (ref 135–145)
Total Bilirubin: 0.8 mg/dL (ref 0.3–1.2)
Total Protein: 6.8 g/dL (ref 6.5–8.1)

## 2023-01-31 LAB — CBC WITH DIFFERENTIAL (CANCER CENTER ONLY)
Abs Immature Granulocytes: 0.01 10*3/uL (ref 0.00–0.07)
Basophils Absolute: 0.1 10*3/uL (ref 0.0–0.1)
Basophils Relative: 1 %
Eosinophils Absolute: 0.2 10*3/uL (ref 0.0–0.5)
Eosinophils Relative: 4 %
HCT: 43.1 % (ref 39.0–52.0)
Hemoglobin: 14.7 g/dL (ref 13.0–17.0)
Immature Granulocytes: 0 %
Lymphocytes Relative: 27 %
Lymphs Abs: 1.7 10*3/uL (ref 0.7–4.0)
MCH: 29.1 pg (ref 26.0–34.0)
MCHC: 34.1 g/dL (ref 30.0–36.0)
MCV: 85.2 fL (ref 80.0–100.0)
Monocytes Absolute: 0.6 10*3/uL (ref 0.1–1.0)
Monocytes Relative: 9 %
Neutro Abs: 3.6 10*3/uL (ref 1.7–7.7)
Neutrophils Relative %: 59 %
Platelet Count: 234 10*3/uL (ref 150–400)
RBC: 5.06 MIL/uL (ref 4.22–5.81)
RDW: 15.2 % (ref 11.5–15.5)
WBC Count: 6.1 10*3/uL (ref 4.0–10.5)
nRBC: 0 % (ref 0.0–0.2)

## 2023-01-31 MED ORDER — DEXTROSE 5 % IV SOLN: Freq: Once | INTRAVENOUS | Status: AC

## 2023-01-31 MED ORDER — OXALIPLATIN CHEMO INJECTION 100 MG/20ML
130.0000 mg/m2 | Freq: Once | INTRAVENOUS | Status: AC
  Administered 2023-01-31: 300 mg via INTRAVENOUS
  Filled 2023-01-31: qty 60

## 2023-01-31 MED ORDER — SODIUM CHLORIDE 0.9 % IV SOLN
10.0000 mg | Freq: Once | INTRAVENOUS | Status: AC
  Administered 2023-01-31: 10 mg via INTRAVENOUS
  Filled 2023-01-31: qty 10

## 2023-01-31 MED ORDER — SODIUM CHLORIDE 0.9% FLUSH
10.0000 mL | INTRAVENOUS | Status: DC | PRN
  Administered 2023-01-31: 10 mL

## 2023-01-31 MED ORDER — HEPARIN SOD (PORK) LOCK FLUSH 100 UNIT/ML IV SOLN
500.0000 [IU] | Freq: Once | INTRAVENOUS | Status: AC | PRN
  Administered 2023-01-31: 500 [IU]

## 2023-01-31 MED ORDER — PALONOSETRON HCL INJECTION 0.25 MG/5ML
0.2500 mg | Freq: Once | INTRAVENOUS | Status: AC
  Administered 2023-01-31: 0.25 mg via INTRAVENOUS
  Filled 2023-01-31: qty 5

## 2023-01-31 NOTE — Progress Notes (Signed)
Patient seen by Dr. Truett Perna today  Vitals are not all within treatment parameters. BP 151/109--OK to proceed, but please recheck BP again and report once he settles in  Labs reviewed by Dr. Truett Perna and are within treatment parameters.  Per physician team, patient is ready for treatment and there are NO modifications to the treatment plan.

## 2023-01-31 NOTE — Progress Notes (Signed)
  [name] OFFICE PROGRESS NOTE   Diagnosis: Colon cancer  INTERVAL HISTORY:   Brandon Ruiz completed cycle 1 CAPOX beginning 01/11/2023.  No nausea/vomiting, mouth sores, or diarrhea.  No hand or foot pain.  He reports cold sensitivity at the mouth and hands for approximately 2 weeks following oxaliplatin.  No neuropathy symptoms at present.  He reports mild discomfort at the Port-A-Cath site after it was accessed today.  He has not taken his blood pressure medication today.  Objective:  Vital signs in last 24 hours:  Blood pressure (!) 151/109, pulse 95, temperature 97.9 F (36.6 C), temperature source Oral, resp. rate 20, height 6' 4.5" (1.943 m), weight 256 lb (116.1 kg), SpO2 97%.    HEENT: No thrush or ulcers Resp: Distant breath sounds with end inspiratory bronchial sounds bilaterally, no respiratory distress Cardio: Regular rate and rhythm GI: No hepatosplenomegaly Vascular: No leg edema  Skin: Mild callus formation and dryness of the palms and soles, no erythema or skin breakdown  Portacath/PICC-without erythema  Lab Results:  Lab Results  Component Value Date   WBC 6.1 01/31/2023   HGB 14.7 01/31/2023   HCT 43.1 01/31/2023   MCV 85.2 01/31/2023   PLT 234 01/31/2023   NEUTROABS 3.6 01/31/2023    CMP  Lab Results  Component Value Date   NA 140 01/31/2023   K 3.9 01/31/2023   CL 105 01/31/2023   CO2 27 01/31/2023   GLUCOSE 173 (H) 01/31/2023   BUN 11 01/31/2023   CREATININE 0.65 01/31/2023   CALCIUM 9.5 01/31/2023   PROT 6.8 01/31/2023   ALBUMIN 4.3 01/31/2023   AST 19 01/31/2023   ALT 26 01/31/2023   ALKPHOS 100 01/31/2023   BILITOT 0.8 01/31/2023   GFRNONAA >60 01/31/2023    Lab Results  Component Value Date   CEA1 1.5 10/20/2022   CEA <2.0 10/19/2022     Medications: I have reviewed the patient's current medications.   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  Multiple colon polyps removed on colonoscopy 10/19/2022-tubular adenomas and tubulovillous adenomas 3.  Nodular left thyroid on CT 10/31/2022 Thyroid ultrasound 11/29/2022-multinodular goiter, 3 nodules meet criteria for 1 year follow-up imaging  4.  Diabetes 5.  Hypertension 6.  Admission with GI bleeding following the colonoscopy 10/19/2022 7.  Hyperlipidemia      Disposition: Brandon Ruiz tolerated the first cycle of CAPOX well.  He will complete cycle 2 today.  He will return for an office visit and cycle 3 CAPOX in 3 weeks. He will use Compazine and Zofran as needed for nausea while taking capecitabine.  He knows to not begin Zofran until after day 3.  Thornton Papas, MD  01/31/2023  1:14 PM

## 2023-01-31 NOTE — Patient Instructions (Signed)
Oxaliplatin Injection What is this medication? OXALIPLATIN (ox AL i PLA tin) treats colorectal cancer. It works by slowing down the growth of cancer cells. This medicine may be used for other purposes; ask your health care provider or pharmacist if you have questions. COMMON BRAND NAME(S): Eloxatin What should I tell my care team before I take this medication? They need to know if you have any of these conditions: Heart disease History of irregular heartbeat or rhythm Liver disease Low blood cell levels (white cells, red cells, and platelets) Lung or breathing disease, such as asthma Take medications that treat or prevent blood clots Tingling of the fingers, toes, or other nerve disorder An unusual or allergic reaction to oxaliplatin, other medications, foods, dyes, or preservatives If you or your partner are pregnant or trying to get pregnant Breast-feeding How should I use this medication? This medication is injected into a vein. It is given by your care team in a hospital or clinic setting. Talk to your care team about the use of this medication in children. Special care may be needed. Overdosage: If you think you have taken too much of this medicine contact a poison control center or emergency room at once. NOTE: This medicine is only for you. Do not share this medicine with others. What if I miss a dose? Keep appointments for follow-up doses. It is important not to miss a dose. Call your care team if you are unable to keep an appointment. What may interact with this medication? Do not take this medication with any of the following: Cisapride Dronedarone Pimozide Thioridazine This medication may also interact with the following: Aspirin and aspirin-like medications Certain medications that treat or prevent blood clots, such as warfarin, apixaban, dabigatran, and rivaroxaban Cisplatin Cyclosporine Diuretics Medications for infection, such as acyclovir, adefovir, amphotericin B,  bacitracin, cidofovir, foscarnet, ganciclovir, gentamicin, pentamidine, vancomycin NSAIDs, medications for pain and inflammation, such as ibuprofen or naproxen Other medications that cause heart rhythm changes Pamidronate Zoledronic acid This list may not describe all possible interactions. Give your health care provider a list of all the medicines, herbs, non-prescription drugs, or dietary supplements you use. Also tell them if you smoke, drink alcohol, or use illegal drugs. Some items may interact with your medicine. What should I watch for while using this medication? Your condition will be monitored carefully while you are receiving this medication. You may need blood work while taking this medication. This medication may make you feel generally unwell. This is not uncommon as chemotherapy can affect healthy cells as well as cancer cells. Report any side effects. Continue your course of treatment even though you feel ill unless your care team tells you to stop. This medication may increase your risk of getting an infection. Call your care team for advice if you get a fever, chills, sore throat, or other symptoms of a cold or flu. Do not treat yourself. Try to avoid being around people who are sick. Avoid taking medications that contain aspirin, acetaminophen, ibuprofen, naproxen, or ketoprofen unless instructed by your care team. These medications may hide a fever. Be careful brushing or flossing your teeth or using a toothpick because you may get an infection or bleed more easily. If you have any dental work done, tell your dentist you are receiving this medication. This medication can make you more sensitive to cold. Do not drink cold drinks or use ice. Cover exposed skin before coming in contact with cold temperatures or cold objects. When out in cold weather  wear warm clothing and cover your mouth and nose to warm the air that goes into your lungs. Tell your care team if you get sensitive to the  cold. Talk to your care team if you or your partner are pregnant or think either of you might be pregnant. This medication can cause serious birth defects if taken during pregnancy and for 9 months after the last dose. A negative pregnancy test is required before starting this medication. A reliable form of contraception is recommended while taking this medication and for 9 months after the last dose. Talk to your care team about effective forms of contraception. Do not father a child while taking this medication and for 6 months after the last dose. Use a condom while having sex during this time period. Do not breastfeed while taking this medication and for 3 months after the last dose. This medication may cause infertility. Talk to your care team if you are concerned about your fertility. What side effects may I notice from receiving this medication? Side effects that you should report to your care team as soon as possible: Allergic reactions--skin rash, itching, hives, swelling of the face, lips, tongue, or throat Bleeding--bloody or black, tar-like stools, vomiting blood or brown material that looks like coffee grounds, red or dark brown urine, small red or purple spots on skin, unusual bruising or bleeding Dry cough, shortness of breath or trouble breathing Heart rhythm changes--fast or irregular heartbeat, dizziness, feeling faint or lightheaded, chest pain, trouble breathing Infection--fever, chills, cough, sore throat, wounds that don't heal, pain or trouble when passing urine, general feeling of discomfort or being unwell Liver injury--right upper belly pain, loss of appetite, nausea, light-colored stool, dark yellow or brown urine, yellowing skin or eyes, unusual weakness or fatigue Low red blood cell level--unusual weakness or fatigue, dizziness, headache, trouble breathing Muscle injury--unusual weakness or fatigue, muscle pain, dark yellow or brown urine, decrease in amount of urine Pain,  tingling, or numbness in the hands or feet Sudden and severe headache, confusion, change in vision, seizures, which may be signs of posterior reversible encephalopathy syndrome (PRES) Unusual bruising or bleeding Side effects that usually do not require medical attention (report to your care team if they continue or are bothersome): Diarrhea Nausea Pain, redness, or swelling with sores inside the mouth or throat Unusual weakness or fatigue Vomiting This list may not describe all possible side effects. Call your doctor for medical advice about side effects. You may report side effects to FDA at 1-800-FDA-1088. Where should I keep my medication? This medication is given in a hospital or clinic. It will not be stored at home. NOTE: This sheet is a summary. It may not cover all possible information. If you have questions about this medicine, talk to your doctor, pharmacist, or health care provider.  2024 Elsevier/Gold Standard (2022-03-28 00:00:00)

## 2023-02-05 ENCOUNTER — Other Ambulatory Visit: Payer: Self-pay | Admitting: Oncology

## 2023-02-08 ENCOUNTER — Encounter (INDEPENDENT_AMBULATORY_CARE_PROVIDER_SITE_OTHER): Payer: Self-pay

## 2023-02-13 ENCOUNTER — Other Ambulatory Visit (HOSPITAL_COMMUNITY): Payer: Self-pay

## 2023-02-15 ENCOUNTER — Other Ambulatory Visit (HOSPITAL_COMMUNITY): Payer: Self-pay

## 2023-02-15 ENCOUNTER — Other Ambulatory Visit: Payer: Self-pay | Admitting: *Deleted

## 2023-02-15 DIAGNOSIS — C187 Malignant neoplasm of sigmoid colon: Secondary | ICD-10-CM

## 2023-02-15 MED ORDER — CAPECITABINE 500 MG PO TABS
ORAL_TABLET | ORAL | 0 refills | Status: DC
Start: 2023-02-15 — End: 2023-03-07
  Filled 2023-02-15: qty 126, fill #0
  Filled 2023-02-16: qty 126, 21d supply, fill #0

## 2023-02-16 ENCOUNTER — Other Ambulatory Visit (HOSPITAL_COMMUNITY): Payer: Self-pay

## 2023-02-16 ENCOUNTER — Other Ambulatory Visit: Payer: Self-pay | Admitting: Genetic Counselor

## 2023-02-16 ENCOUNTER — Other Ambulatory Visit: Payer: Self-pay

## 2023-02-16 DIAGNOSIS — Z1379 Encounter for other screening for genetic and chromosomal anomalies: Secondary | ICD-10-CM

## 2023-02-18 ENCOUNTER — Other Ambulatory Visit: Payer: Self-pay | Admitting: Oncology

## 2023-02-22 ENCOUNTER — Inpatient Hospital Stay: Payer: Managed Care, Other (non HMO)

## 2023-02-22 ENCOUNTER — Inpatient Hospital Stay: Payer: Managed Care, Other (non HMO) | Admitting: Nurse Practitioner

## 2023-02-22 ENCOUNTER — Encounter: Payer: Self-pay | Admitting: Nurse Practitioner

## 2023-02-22 VITALS — BP 109/79 | HR 100 | Temp 97.9°F | Resp 18 | Ht 76.0 in | Wt 251.2 lb

## 2023-02-22 VITALS — BP 112/82 | HR 97 | Resp 18

## 2023-02-22 DIAGNOSIS — Z1379 Encounter for other screening for genetic and chromosomal anomalies: Secondary | ICD-10-CM

## 2023-02-22 DIAGNOSIS — C187 Malignant neoplasm of sigmoid colon: Secondary | ICD-10-CM

## 2023-02-22 DIAGNOSIS — Z5111 Encounter for antineoplastic chemotherapy: Secondary | ICD-10-CM | POA: Diagnosis not present

## 2023-02-22 LAB — CMP (CANCER CENTER ONLY)
ALT: 23 U/L (ref 0–44)
AST: 18 U/L (ref 15–41)
Albumin: 4.6 g/dL (ref 3.5–5.0)
Alkaline Phosphatase: 118 U/L (ref 38–126)
Anion gap: 9 (ref 5–15)
BUN: 18 mg/dL (ref 6–20)
CO2: 27 mmol/L (ref 22–32)
Calcium: 9.9 mg/dL (ref 8.9–10.3)
Chloride: 102 mmol/L (ref 98–111)
Creatinine: 0.96 mg/dL (ref 0.61–1.24)
GFR, Estimated: >60 mL/min (ref >60)
Glucose, Bld: 130 mg/dL — ABNORMAL HIGH (ref 70–99)
Potassium: 4.6 mmol/L (ref 3.5–5.1)
Sodium: 138 mmol/L (ref 135–145)
Total Bilirubin: 1 mg/dL (ref 0.3–1.2)
Total Protein: 7.4 g/dL (ref 6.5–8.1)

## 2023-02-22 LAB — CBC WITH DIFFERENTIAL (CANCER CENTER ONLY)
Abs Immature Granulocytes: 0.02 10*3/uL (ref 0.00–0.07)
Basophils Absolute: 0.1 10*3/uL (ref 0.0–0.1)
Basophils Relative: 1 %
Eosinophils Absolute: 0.2 10*3/uL (ref 0.0–0.5)
Eosinophils Relative: 3 %
HCT: 46.9 % (ref 39.0–52.0)
Hemoglobin: 16.1 g/dL (ref 13.0–17.0)
Immature Granulocytes: 0 %
Lymphocytes Relative: 30 %
Lymphs Abs: 2.2 10*3/uL (ref 0.7–4.0)
MCH: 29.9 pg (ref 26.0–34.0)
MCHC: 34.3 g/dL (ref 30.0–36.0)
MCV: 87.2 fL (ref 80.0–100.0)
Monocytes Absolute: 0.8 10*3/uL (ref 0.1–1.0)
Monocytes Relative: 12 %
Neutro Abs: 3.9 10*3/uL (ref 1.7–7.7)
Neutrophils Relative %: 54 %
Platelet Count: 243 10*3/uL (ref 150–400)
RBC: 5.38 MIL/uL (ref 4.22–5.81)
RDW: 18.8 % — ABNORMAL HIGH (ref 11.5–15.5)
WBC Count: 7.2 10*3/uL (ref 4.0–10.5)
nRBC: 0 % (ref 0.0–0.2)

## 2023-02-22 MED ORDER — OXALIPLATIN CHEMO INJECTION 100 MG/20ML
130.0000 mg/m2 | Freq: Once | INTRAVENOUS | Status: AC
  Administered 2023-02-22: 300 mg via INTRAVENOUS
  Filled 2023-02-22: qty 60

## 2023-02-22 MED ORDER — PALONOSETRON HCL INJECTION 0.25 MG/5ML
0.2500 mg | Freq: Once | INTRAVENOUS | Status: AC
  Administered 2023-02-22: 0.25 mg via INTRAVENOUS
  Filled 2023-02-22: qty 5

## 2023-02-22 MED ORDER — SODIUM CHLORIDE 0.9 % IV SOLN
10.0000 mg | Freq: Once | INTRAVENOUS | Status: AC
  Administered 2023-02-22: 10 mg via INTRAVENOUS
  Filled 2023-02-22: qty 10

## 2023-02-22 MED ORDER — DEXTROSE 5 % IV SOLN: Freq: Once | INTRAVENOUS | Status: AC

## 2023-02-22 MED ORDER — HEPARIN SOD (PORK) LOCK FLUSH 100 UNIT/ML IV SOLN: 500.0000 [IU] | Freq: Once | INTRAVENOUS | Status: DC | PRN

## 2023-02-22 MED ORDER — SODIUM CHLORIDE 0.9% FLUSH: 10.0000 mL | INTRAVENOUS | Status: DC | PRN

## 2023-02-22 NOTE — Progress Notes (Signed)
Patient seen by Ebbie Ridge. Brandon Ruiz today  Vitals are within treatment parameters.  Labs reviewed by Lonna Cobb NP and are within treatment parameters.  Per physician team, patient is ready for treatment and there are NO modifications to the treatment plan.

## 2023-02-22 NOTE — Progress Notes (Signed)
  [facility] OFFICE PROGRESS NOTE   Diagnosis: Colon cancer  INTERVAL HISTORY:   Mr. Wlodarczyk returns as scheduled.  He completed cycle 2 CAPOX beginning 01/31/2023.  He denies nausea/vomiting.  No mouth sores.  No diarrhea.  Some constipation.  No hand or foot pain or redness.  Cold sensitivity lasted approximately 2 weeks.  No persistent neuropathy symptoms.  He noted more fatigue with cycle 2.  Objective:  Vital signs in last 24 hours:  Blood pressure 109/79, pulse 100, temperature 97.9 F (36.6 C), temperature source Oral, resp. rate 18, height 6\' 4"  (1.93 m), weight 251 lb 3.2 oz (113.9 kg), SpO2 98%.    HEENT: No thrush or ulcers. Resp: Lungs clear bilaterally. Cardio: Regular rate and rhythm. GI: No hepatosplenomegaly. Vascular: No leg edema. Neuro: Vibratory sense intact over the fingertips per tuning fork exam. Skin: Palms with mild erythema. Port-A-Cath without erythema.  Lab Results:  Lab Results  Component Value Date   WBC 7.2 02/22/2023   HGB 16.1 02/22/2023   HCT 46.9 02/22/2023   MCV 87.2 02/22/2023   PLT 243 02/22/2023   NEUTROABS 3.9 02/22/2023    Imaging:  No results found.  Medications: I have reviewed the patient's current medications.  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 and areas spacious for invasion Well to moderately differentiated adenocarcinoma, tumor invades through muscularis propria, no lymphovascular or perineural invasion, negative margins, no macroscopic tumor perforation, no tumor deposits, 1/14 nodes, no loss of mismatch repair protein expression Normal preoperative CEA CTs 10/31/2022-sigmoid wall thickening, no evidence of metastatic disease, calcified right hilar lymph nodes and 14 mm right  upper lobe nodule with adjacent tiny nodules favored to reflect prior granulomatous disease Cycle 1 CAPOX 01/11/2023 Cycle 2 CAPOX 01/31/2023 Cycle 3 CAPOX 02/22/2023 2.  Multiple colon polyps removed on colonoscopy 10/19/2022-tubular adenomas and tubulovillous adenomas 3.  Nodular left thyroid on CT 10/31/2022 Thyroid ultrasound 11/29/2022-multinodular goiter, 3 nodules meet criteria for 1 year follow-up imaging   4.  Diabetes 5.  Hypertension 6.  Admission with GI bleeding following the colonoscopy 10/19/2022 7.  Hyperlipidemia  Disposition: Mr. Pienkowski appears stable.  He has completed 2 cycles of CAPOX.  He is tolerating treatment well.  Plan to proceed with cycle 3 today as scheduled.  CBC and chemistry panel reviewed.  Labs adequate to proceed as above.  He will return for follow-up and treatment in 3 weeks.  He will contact the office in the interim with any problems.    Lonna Cobb ANP/GNP-BC   02/22/2023  11:18 AM

## 2023-02-22 NOTE — Patient Instructions (Signed)
[place] [facility]   Discharge Instructions: Thank you for choosing [facility] to provide your oncology and hematology care.   If you have a lab appointment with the Cancer Center, please go directly to the Cancer Center and check in at the registration area.   Wear comfortable clothing and clothing appropriate for easy access to any Portacath or PICC line.   We strive to give you quality time with your provider. You may need to reschedule your appointment if you arrive late (15 or more minutes).  Arriving late affects you and other patients whose appointments are after yours.  Also, if you miss three or more appointments without notifying the office, you may be dismissed from the clinic at the provider's discretion.      For prescription refill requests, have your pharmacy contact our office and allow 72 hours for refills to be completed.    Today you received the following chemotherapy and/or immunotherapy agents Oxaliplatin (ELOXATIN).      To help prevent nausea and vomiting after your treatment, we encourage you to take your nausea medication as directed.  BELOW ARE SYMPTOMS THAT SHOULD BE REPORTED IMMEDIATELY: *FEVER GREATER THAN 100.4 F (38 C) OR HIGHER *CHILLS OR SWEATING *NAUSEA AND VOMITING THAT IS NOT CONTROLLED WITH YOUR NAUSEA MEDICATION *UNUSUAL SHORTNESS OF BREATH *UNUSUAL BRUISING OR BLEEDING *URINARY PROBLEMS (pain or burning when urinating, or frequent urination) *BOWEL PROBLEMS (unusual diarrhea, constipation, pain near the anus) TENDERNESS IN MOUTH AND THROAT WITH OR WITHOUT PRESENCE OF ULCERS (sore throat, sores in mouth, or a toothache) UNUSUAL RASH, SWELLING OR PAIN  UNUSUAL VAGINAL DISCHARGE OR ITCHING   Items with * indicate a potential emergency and should be followed up as soon as possible or go to the Emergency Department if any problems should occur.  Please show the CHEMOTHERAPY ALERT CARD or IMMUNOTHERAPY ALERT  CARD at check-in to the Emergency Department and triage nurse.  Should you have questions after your visit or need to cancel or reschedule your appointment, please contact [facility] [place]  Dept: [phone]  and follow the prompts.  Office hours are 8:00 a.m. to 4:30 p.m. Monday - Friday. Please note that voicemails left after 4:00 p.m. may not be returned until the following business day.  We are closed weekends and major holidays. You have access to a nurse at all times for urgent questions. Please call the main number to the clinic Dept: [phone] and follow the prompts.   For any non-urgent questions, you may also contact your provider using MyChart. We now offer e-Visits for anyone 35 and older to request care online for non-urgent symptoms. For details visit [email or web address].   Also download the MyChart app! Go to the app store, search "MyChart", open the app, select [facility], and log in with your MyChart username and password.  Oxaliplatin Injection What is this medication? OXALIPLATIN (ox AL i PLA tin) treats colorectal cancer. It works by slowing down the growth of cancer cells. This medicine may be used for other purposes; ask your health care provider or pharmacist if you have questions. COMMON BRAND NAME(S): Eloxatin What should I tell my care team before I take this medication? They need to know if you have any of these conditions: Heart disease History of irregular heartbeat or rhythm Liver disease Low blood cell levels (white cells, red cells, and platelets) Lung or breathing disease, such as asthma Take medications that treat or prevent blood clots  Tingling of the fingers, toes, or other nerve disorder An unusual or allergic reaction to oxaliplatin, other medications, foods, dyes, or preservatives If you or your partner are pregnant or trying to get pregnant Breast-feeding How should I use this medication? This medication is  injected into a vein. It is given by your care team in a hospital or clinic setting. Talk to your care team about the use of this medication in children. Special care may be needed. Overdosage: If you think you have taken too much of this medicine contact a poison control center or emergency room at once. NOTE: This medicine is only for you. Do not share this medicine with others. What if I miss a dose? Keep appointments for follow-up doses. It is important not to miss a dose. Call your care team if you are unable to keep an appointment. What may interact with this medication? Do not take this medication with any of the following: Cisapride Dronedarone Pimozide Thioridazine This medication may also interact with the following: Aspirin and aspirin-like medications Certain medications that treat or prevent blood clots, such as warfarin, apixaban, dabigatran, and rivaroxaban Cisplatin Cyclosporine Diuretics Medications for infection, such as acyclovir, adefovir, amphotericin B, bacitracin, cidofovir, foscarnet, ganciclovir, gentamicin, pentamidine, vancomycin NSAIDs, medications for pain and inflammation, such as ibuprofen or naproxen Other medications that cause heart rhythm changes Pamidronate Zoledronic acid This list may not describe all possible interactions. Give your health care provider a list of all the medicines, herbs, non-prescription drugs, or dietary supplements you use. Also tell them if you smoke, drink alcohol, or use illegal drugs. Some items may interact with your medicine. What should I watch for while using this medication? Your condition will be monitored carefully while you are receiving this medication. You may need blood work while taking this medication. This medication may make you feel generally unwell. This is not uncommon as chemotherapy can affect healthy cells as well as cancer cells. Report any side effects. Continue your course of treatment even though you  feel ill unless your care team tells you to stop. This medication may increase your risk of getting an infection. Call your care team for advice if you get a fever, chills, sore throat, or other symptoms of a cold or flu. Do not treat yourself. Try to avoid being around people who are sick. Avoid taking medications that contain aspirin, acetaminophen, ibuprofen, naproxen, or ketoprofen unless instructed by your care team. These medications may hide a fever. Be careful brushing or flossing your teeth or using a toothpick because you may get an infection or bleed more easily. If you have any dental work done, tell your dentist you are receiving this medication. This medication can make you more sensitive to cold. Do not drink cold drinks or use ice. Cover exposed skin before coming in contact with cold temperatures or cold objects. When out in cold weather wear warm clothing and cover your mouth and nose to warm the air that goes into your lungs. Tell your care team if you get sensitive to the cold. Talk to your care team if you or your partner are pregnant or think either of you might be pregnant. This medication can cause serious birth defects if taken during pregnancy and for 9 months after the last dose. A negative pregnancy test is required before starting this medication. A reliable form of contraception is recommended while taking this medication and for 9 months after the last dose. Talk to your care team about effective forms  of contraception. Do not father a child while taking this medication and for 6 months after the last dose. Use a condom while having sex during this time period. Do not breastfeed while taking this medication and for 3 months after the last dose. This medication may cause infertility. Talk to your care team if you are concerned about your fertility. What side effects may I notice from receiving this medication? Side effects that you should report to your care team as soon as  possible: Allergic reactions--skin rash, itching, hives, swelling of the face, lips, tongue, or throat Bleeding--bloody or black, tar-like stools, vomiting blood or brown material that looks like coffee grounds, red or dark brown urine, small red or purple spots on skin, unusual bruising or bleeding Dry cough, shortness of breath or trouble breathing Heart rhythm changes--fast or irregular heartbeat, dizziness, feeling faint or lightheaded, chest pain, trouble breathing Infection--fever, chills, cough, sore throat, wounds that don't heal, pain or trouble when passing urine, general feeling of discomfort or being unwell Liver injury--right upper belly pain, loss of appetite, nausea, light-colored stool, dark yellow or brown urine, yellowing skin or eyes, unusual weakness or fatigue Low red blood cell level--unusual weakness or fatigue, dizziness, headache, trouble breathing Muscle injury--unusual weakness or fatigue, muscle pain, dark yellow or brown urine, decrease in amount of urine Pain, tingling, or numbness in the hands or feet Sudden and severe headache, confusion, change in vision, seizures, which may be signs of posterior reversible encephalopathy syndrome (PRES) Unusual bruising or bleeding Side effects that usually do not require medical attention (report to your care team if they continue or are bothersome): Diarrhea Nausea Pain, redness, or swelling with sores inside the mouth or throat Unusual weakness or fatigue Vomiting This list may not describe all possible side effects. Call your doctor for medical advice about side effects. You may report side effects to FDA at 1-800-FDA-1088. Where should I keep my medication? This medication is given in a hospital or clinic. It will not be stored at home. NOTE: This sheet is a summary. It may not cover all possible information. If you have questions about this medicine, talk to your doctor, pharmacist, or health care provider.  2024  Elsevier/Gold Standard (2022-03-28 00:00:00)

## 2023-02-23 ENCOUNTER — Other Ambulatory Visit: Payer: Self-pay

## 2023-02-28 ENCOUNTER — Encounter: Payer: Self-pay | Admitting: Internal Medicine

## 2023-02-28 ENCOUNTER — Ambulatory Visit (INDEPENDENT_AMBULATORY_CARE_PROVIDER_SITE_OTHER): Payer: Managed Care, Other (non HMO) | Admitting: Internal Medicine

## 2023-02-28 VITALS — BP 136/88 | HR 90 | Temp 98.2°F | Resp 16 | Ht 76.0 in | Wt 248.0 lb

## 2023-02-28 DIAGNOSIS — R000 Tachycardia, unspecified: Secondary | ICD-10-CM

## 2023-02-28 DIAGNOSIS — M25511 Pain in right shoulder: Secondary | ICD-10-CM | POA: Diagnosis not present

## 2023-02-28 DIAGNOSIS — I10 Essential (primary) hypertension: Secondary | ICD-10-CM

## 2023-02-28 DIAGNOSIS — G8929 Other chronic pain: Secondary | ICD-10-CM | POA: Insufficient documentation

## 2023-02-28 DIAGNOSIS — Z794 Long term (current) use of insulin: Secondary | ICD-10-CM

## 2023-02-28 DIAGNOSIS — E119 Type 2 diabetes mellitus without complications: Secondary | ICD-10-CM

## 2023-02-28 DIAGNOSIS — M25611 Stiffness of right shoulder, not elsewhere classified: Secondary | ICD-10-CM | POA: Insufficient documentation

## 2023-02-28 MED ORDER — METOPROLOL SUCCINATE ER 25 MG PO TB24
25.0000 mg | ORAL_TABLET | Freq: Every day | ORAL | 1 refills | Status: DC
Start: 2023-02-28 — End: 2023-04-13

## 2023-02-28 NOTE — Progress Notes (Unsigned)
Subjective:  Patient ID: Brandon Ruiz, male    DOB: 1975/07/04  Age: 47 y.o. MRN: [id number]  CC: Hypertension and Diabetes   HPI Daequon Adriance presents for f/up -     Outpatient Medications Prior to Visit  Medication Sig Dispense Refill   acetaminophen (TYLENOL) 500 MG tablet Take 1,000 mg by mouth every 6 (six) hours as needed for moderate pain.     capecitabine (XELODA) 500 MG tablet Take 5 tablets (2500mg ) by mouth in AM and 4 tablets (2000mg ) in PM. Take with food. Take for 14 days, then hold for 7 days repeat every 21 days. Start cycle on 02/22/23 126 tablet 0   empagliflozin (JARDIANCE) 10 MG TABS tablet Take 1 tablet (10 mg total) by mouth daily before breakfast. 90 tablet 0   Insulin Glargine (BASAGLAR KWIKPEN) 100 UNIT/ML Inject 20 Units into the skin daily. 18 mL 0   lidocaine-prilocaine (EMLA) cream Apply 1 Application topically as needed (Apply to Port 1-2 hours prior to coming to cancer center). 30 g 0   metFORMIN (GLUCOPHAGE) 1000 MG tablet Take 1 tablet (1,000 mg total) by mouth 2 (two) times daily with a meal. 180 tablet 0   olmesartan (BENICAR) 20 MG tablet Take 1 tablet (20 mg total) by mouth daily. 90 tablet 0   ondansetron (ZOFRAN) 8 MG tablet TAKE ONE TABLET BY MOUTH EVERY EIGHT HOURS AS NEEDED (STARTING 3 DAYS AFTER OXALIPLATIN AS NEEDED FOR NAUSEA) 30 tablet 1   prochlorperazine (COMPAZINE) 10 MG tablet TAKE ONE TABLET BY MOUTH EVERY SIX HOURS AS NEEDED FOR NAUSEA OR VOMITING 60 tablet 1   rosuvastatin (CRESTOR) 10 MG tablet Take 1 tablet (10 mg total) by mouth daily. 90 tablet 0   metoprolol succinate (TOPROL-XL) 25 MG 24 hr tablet Take 1 tablet (25 mg total) by mouth daily. 90 tablet 1   No facility-administered medications prior to visit.    ROS Review of Systems  Objective:  BP 136/88 (BP Location: Left Arm, Patient Position: Sitting, Cuff Size: Large)   Pulse 90   Temp 98.2 F (36.8 C) (Oral)   Resp 16   Ht 6\' 4"  (1.93 m)   Wt 248 lb (112.5 kg)   SpO2 96%    BMI 30.19 kg/m   BP Readings from Last 3 Encounters:  02/28/23 136/88  02/22/23 112/82  02/22/23 109/79    Wt Readings from Last 3 Encounters:  02/28/23 248 lb (112.5 kg)  02/22/23 251 lb 3.2 oz (113.9 kg)  01/31/23 256 lb (116.1 kg)    Physical Exam Vitals reviewed.  Constitutional:      Appearance: Normal appearance. He is not ill-appearing.  HENT:     Nose: Nose normal.     Mouth/Throat:     Mouth: Mucous membranes are moist.  Eyes:     General: No scleral icterus.    Conjunctiva/sclera: Conjunctivae normal.  Cardiovascular:     Rate and Rhythm: Normal rate and regular rhythm.     Heart sounds: No murmur heard.    No gallop.  Pulmonary:     Effort: Pulmonary effort is normal.     Breath sounds: No stridor. No wheezing, rhonchi or rales.  Abdominal:     General: Abdomen is flat.     Palpations: There is no mass.     Tenderness: There is no abdominal tenderness. There is no guarding.     Hernia: No hernia is present.  Musculoskeletal:        General: Normal range of  motion.     Right shoulder: Tenderness, bony tenderness and crepitus present. No swelling. Decreased strength.     Left shoulder: Normal.     Cervical back: Neck supple.     Right lower leg: No edema.  Skin:    General: Skin is warm and dry.  Neurological:     General: No focal deficit present.     Mental Status: He is alert. Mental status is at baseline.     Lab Results  Component Value Date   WBC 7.2 02/22/2023   HGB 16.1 02/22/2023   HCT 46.9 02/22/2023   PLT 243 02/22/2023   GLUCOSE 130 (H) 02/22/2023   CHOL 164 08/03/2022   TRIG 389.0 (H) 08/03/2022   HDL 27.10 (L) 08/03/2022   LDLDIRECT 85.0 08/03/2022   ALT 23 02/22/2023   AST 18 02/22/2023   NA 138 02/22/2023   K 4.6 02/22/2023   CL 102 02/22/2023   CREATININE 0.96 02/22/2023   BUN 18 02/22/2023   CO2 27 02/22/2023   TSH 0.71 08/03/2022   PSA 0.93 08/03/2022   INR 1.1 10/19/2022   HGBA1C 6.2 (H) 12/11/2022   MICROALBUR  9.0 (H) 08/03/2022    IR IMAGING GUIDED PORT INSERTION  Result Date: 01/08/2023 INDICATION: 47 year old male with a recent diagnosis of colon cancer. He requires durable venous access for chemotherapy. EXAM: IMPLANTED PORT A CATH PLACEMENT WITH ULTRASOUND AND FLUOROSCOPIC GUIDANCE MEDICATIONS: None. ANESTHESIA/SEDATION: Versed 3 mg IV; Fentanyl 100 mcg IV; Moderate Sedation Time:  15 minutes The patient's vital signs and level of consciousness were continuously monitored during the procedure by the interventional radiology nurse under my direct supervision. FLUOROSCOPY: Radiation exposure index: 4 mGy reference air kerma COMPLICATIONS: None immediate. PROCEDURE: The right neck and chest was prepped with chlorhexidine, and draped in the usual sterile fashion using maximum barrier technique (cap and mask, sterile gown, sterile gloves, large sterile sheet, hand hygiene and cutaneous antiseptic). Local anesthesia was attained by infiltration with 1% lidocaine with epinephrine. Ultrasound demonstrated patency of the right internal jugular vein, and this was documented with an image. Under real-time ultrasound guidance, this vein was accessed with a 21 gauge micropuncture needle and image documentation was performed. A small dermatotomy was made at the access site with an 11 scalpel. A 0.018" wire was advanced into the SVC and the access needle exchanged for a 3F micropuncture vascular sheath. The 0.018" wire was then removed and a 0.035" wire advanced into the IVC. An appropriate location for the subcutaneous reservoir was selected below the clavicle and an incision was made through the skin and underlying soft tissues. The subcutaneous tissues were then dissected using a combination of blunt and sharp surgical technique and a pocket was formed. A single lumen power injectable portacatheter was then tunneled through the subcutaneous tissues from the pocket to the dermatotomy and the port reservoir placed within the  subcutaneous pocket. The venous access site was then serially dilated and a peel away vascular sheath placed over the wire. The wire was removed and the port catheter advanced into position under fluoroscopic guidance. The catheter tip is positioned in the superior cavoatrial junction. This was documented with a spot image. The portacatheter was then tested and found to flush and aspirate well. The port was flushed with saline followed by 100 units/mL heparinized saline. The pocket was then closed in two layers using first subdermal inverted interrupted absorbable sutures followed by a running subcuticular suture. The epidermis was then sealed with Dermabond. The dermatotomy at  the venous access site was also closed with Dermabond. IMPRESSION: Successful placement of a right IJ approach Power Port with ultrasound and fluoroscopic guidance. The catheter is ready for use. Electronically Signed   By: Malachy Moan M.D.   On: 01/08/2023 15:00    Assessment & Plan:  Primary hypertension -     Metoprolol Succinate ER; Take 1 tablet (25 mg total) by mouth daily.  Dispense: 90 tablet; Refill: 1  Tachycardia -     Metoprolol Succinate ER; Take 1 tablet (25 mg total) by mouth daily.  Dispense: 90 tablet; Refill: 1  Chronic right shoulder pain -     MR SHOULDER RIGHT WO CONTRAST; Future  Decreased ROM of right shoulder -     MR SHOULDER RIGHT WO CONTRAST; Future  Insulin-requiring or dependent type II diabetes mellitus (HCC) -     Ambulatory referral to Ophthalmology -     Hemoglobin A1c; Future     Follow-up: Return in about 3 months (around 05/30/2023).  Sanda Linger, MD

## 2023-02-28 NOTE — Patient Instructions (Signed)
Shoulder Pain Many things can cause shoulder pain, including: An injury to the shoulder. Overuse of the shoulder. Arthritis. The source of the pain can be: Inflammation. An injury to the shoulder joint. An injury to a tendon, ligament, or bone. Follow these instructions at home: Pay attention to changes in your symptoms. Let your health care provider know about them. Follow these instructions to relieve your pain. If you have a removable sling: Wear the sling as told by your provider. Remove it only as told by your provider. Check the skin around the sling every day. Tell your provider about any concerns. Loosen the sling if your fingers tingle, become numb, or become cold. Keep the sling clean. If the sling is not waterproof: Do not let it get wet. Remove it to shower or bathe. Move your arm as little as possible, but keep your hand moving to prevent swelling. Managing pain, stiffness, and swelling  If told, put ice on the painful area. If you have a removable sling or immobilizer, remove it as told by your provider. Put ice in a plastic bag. Place a towel between your skin and the bag. Leave the ice on for 20 minutes, 2-3 times a day. If your skin turns bright red, remove the ice right away to prevent skin damage. The risk of damage is higher if you cannot feel pain, heat, or cold. Move your fingers often to reduce stiffness and swelling. Squeeze a soft ball or a foam pad as much as possible. This helps to keep the shoulder from swelling. It also helps to strengthen the arm. General instructions Take over-the-counter and prescription medicines only as told by your provider. Exercise may help with pain management. Perform exercises if told by your provider. You may be referred to a physical therapist to help in your recovery process. Keep all follow-up visits in order to avoid any type of permanent shoulder disability or chronic pain problems. Contact a health care provider  if: Your pain is not relieved with medicines. New pain develops in your arm, hand, or fingers. You loosen your sling and your arm, hand, or fingers remain tingly, numb, swollen, or painful. Get help right away if: Your arm, hand, or fingers turn white or blue. This information is not intended to replace advice given to you by your health care provider. Make sure you discuss any questions you have with your health care provider. Document Revised: 01/13/2022 Document Reviewed: 01/13/2022 Elsevier Patient Education  2024 [employer] Inc.  

## 2023-03-01 ENCOUNTER — Other Ambulatory Visit: Payer: Self-pay

## 2023-03-05 ENCOUNTER — Other Ambulatory Visit (HOSPITAL_COMMUNITY): Payer: Self-pay

## 2023-03-07 ENCOUNTER — Other Ambulatory Visit: Payer: Managed Care, Other (non HMO)

## 2023-03-07 ENCOUNTER — Other Ambulatory Visit (HOSPITAL_COMMUNITY): Payer: Self-pay

## 2023-03-07 ENCOUNTER — Other Ambulatory Visit: Payer: Self-pay

## 2023-03-07 ENCOUNTER — Other Ambulatory Visit: Payer: Self-pay | Admitting: Oncology

## 2023-03-07 ENCOUNTER — Encounter: Payer: Self-pay | Admitting: Internal Medicine

## 2023-03-07 ENCOUNTER — Other Ambulatory Visit: Payer: Self-pay | Admitting: Internal Medicine

## 2023-03-07 DIAGNOSIS — R5382 Chronic fatigue, unspecified: Secondary | ICD-10-CM

## 2023-03-07 DIAGNOSIS — C187 Malignant neoplasm of sigmoid colon: Secondary | ICD-10-CM

## 2023-03-07 DIAGNOSIS — M25611 Stiffness of right shoulder, not elsewhere classified: Secondary | ICD-10-CM

## 2023-03-07 DIAGNOSIS — G8929 Other chronic pain: Secondary | ICD-10-CM

## 2023-03-07 MED ORDER — CAPECITABINE 500 MG PO TABS
ORAL_TABLET | ORAL | 0 refills | Status: DC
  Filled 2023-03-07: qty 126, 21d supply, fill #0

## 2023-03-09 ENCOUNTER — Other Ambulatory Visit: Payer: Self-pay | Admitting: Internal Medicine

## 2023-03-10 ENCOUNTER — Other Ambulatory Visit: Payer: Self-pay | Admitting: Oncology

## 2023-03-12 ENCOUNTER — Other Ambulatory Visit: Payer: Self-pay | Admitting: Internal Medicine

## 2023-03-12 DIAGNOSIS — R0683 Snoring: Secondary | ICD-10-CM

## 2023-03-14 ENCOUNTER — Inpatient Hospital Stay: Payer: Managed Care, Other (non HMO)

## 2023-03-14 ENCOUNTER — Encounter: Payer: Self-pay | Admitting: Nurse Practitioner

## 2023-03-14 ENCOUNTER — Inpatient Hospital Stay: Payer: Managed Care, Other (non HMO) | Attending: Oncology

## 2023-03-14 ENCOUNTER — Inpatient Hospital Stay (HOSPITAL_BASED_OUTPATIENT_CLINIC_OR_DEPARTMENT_OTHER): Payer: Managed Care, Other (non HMO) | Admitting: Nurse Practitioner

## 2023-03-14 VITALS — BP 131/87 | HR 100 | Temp 98.1°F | Resp 18 | Ht 76.0 in | Wt 254.2 lb

## 2023-03-14 DIAGNOSIS — I10 Essential (primary) hypertension: Secondary | ICD-10-CM | POA: Diagnosis not present

## 2023-03-14 DIAGNOSIS — T451X5A Adverse effect of antineoplastic and immunosuppressive drugs, initial encounter: Secondary | ICD-10-CM | POA: Insufficient documentation

## 2023-03-14 DIAGNOSIS — E119 Type 2 diabetes mellitus without complications: Secondary | ICD-10-CM | POA: Diagnosis not present

## 2023-03-14 DIAGNOSIS — Z95828 Presence of other vascular implants and grafts: Secondary | ICD-10-CM

## 2023-03-14 DIAGNOSIS — C187 Malignant neoplasm of sigmoid colon: Secondary | ICD-10-CM

## 2023-03-14 DIAGNOSIS — G620 Drug-induced polyneuropathy: Secondary | ICD-10-CM | POA: Diagnosis not present

## 2023-03-14 LAB — CBC WITH DIFFERENTIAL (CANCER CENTER ONLY)
Abs Immature Granulocytes: 0.02 10*3/uL (ref 0.00–0.07)
Basophils Absolute: 0.1 10*3/uL (ref 0.0–0.1)
Basophils Relative: 1 %
Eosinophils Absolute: 0.2 10*3/uL (ref 0.0–0.5)
Eosinophils Relative: 3 %
HCT: 45.3 % (ref 39.0–52.0)
Hemoglobin: 16 g/dL (ref 13.0–17.0)
Immature Granulocytes: 0 %
Lymphocytes Relative: 30 %
Lymphs Abs: 2 10*3/uL (ref 0.7–4.0)
MCH: 31.3 pg (ref 26.0–34.0)
MCHC: 35.3 g/dL (ref 30.0–36.0)
MCV: 88.5 fL (ref 80.0–100.0)
Monocytes Absolute: 0.7 10*3/uL (ref 0.1–1.0)
Monocytes Relative: 11 %
Neutro Abs: 3.6 10*3/uL (ref 1.7–7.7)
Neutrophils Relative %: 55 %
Platelet Count: 209 10*3/uL (ref 150–400)
RBC: 5.12 MIL/uL (ref 4.22–5.81)
RDW: 21.2 % — ABNORMAL HIGH (ref 11.5–15.5)
WBC Count: 6.5 10*3/uL (ref 4.0–10.5)
nRBC: 0 % (ref 0.0–0.2)

## 2023-03-14 LAB — CMP (CANCER CENTER ONLY)
ALT: 39 U/L (ref 0–44)
AST: 30 U/L (ref 15–41)
Albumin: 4.4 g/dL (ref 3.5–5.0)
Alkaline Phosphatase: 106 U/L (ref 38–126)
Anion gap: 10 (ref 5–15)
BUN: 15 mg/dL (ref 6–20)
CO2: 26 mmol/L (ref 22–32)
Calcium: 9.8 mg/dL (ref 8.9–10.3)
Chloride: 102 mmol/L (ref 98–111)
Creatinine: 0.84 mg/dL (ref 0.61–1.24)
GFR, Estimated: >60 mL/min (ref >60)
Glucose, Bld: 139 mg/dL — ABNORMAL HIGH (ref 70–99)
Potassium: 4.1 mmol/L (ref 3.5–5.1)
Sodium: 138 mmol/L (ref 135–145)
Total Bilirubin: 1 mg/dL (ref 0.3–1.2)
Total Protein: 7.3 g/dL (ref 6.5–8.1)

## 2023-03-14 LAB — CEA (ACCESS): CEA (CHCC): 1.54 ng/mL (ref 0.00–5.00)

## 2023-03-14 MED ORDER — HEPARIN SOD (PORK) LOCK FLUSH 100 UNIT/ML IV SOLN
500.0000 [IU] | Freq: Once | INTRAVENOUS | Status: AC
  Administered 2023-03-14: 500 [IU] via INTRAVENOUS

## 2023-03-14 MED ORDER — SODIUM CHLORIDE 0.9% FLUSH
10.0000 mL | INTRAVENOUS | Status: DC | PRN
  Administered 2023-03-14: 10 mL via INTRAVENOUS

## 2023-03-14 NOTE — Addendum Note (Signed)
Addended by: Dimitri Ped on: 03/14/2023 12:15 PM   Modules accepted: Orders

## 2023-03-14 NOTE — Progress Notes (Signed)
[facility] OFFICE PROGRESS NOTE   Diagnosis: Colon cancer  INTERVAL HISTORY:   Brandon Ruiz returns as scheduled.  He completed cycle 3 CAPOX beginning 02/22/2023.  He had 3 episodes of nausea/vomiting during the second week of Xeloda.  No mouth sores.  No diarrhea.  No hand or foot pain or redness.  He notes a pins/needles like sensation in the hands and feet most pronounced when he wakes up in the mornings.  Symptoms are present more days than not.  This limits his ability to start working on time and also complete work activities.  He also notes progressive fatigue.  He has discomfort around the Port-A-Cath site with certain work-related activities.  Objective:  Vital signs in last 24 hours:  Blood pressure 131/87, pulse 100, temperature 98.1 F (36.7 C), temperature source Skin, resp. rate 18, height 6\' 4"  (1.93 m), weight 254 lb 3.2 oz (115.3 kg), SpO2 98%.    HEENT: No thrush or ulcers. Resp: Lungs clear bilaterally. Cardio: Regular rate and rhythm. GI: No hepatosplenomegaly. Vascular: No leg edema. Neuro: Vibratory sense intact over the fingertips for tuning fork exam. Skin: Palms without erythema. Port-A-Cath without erythema.  Lab Results:  Lab Results  Component Value Date   WBC 6.5 03/14/2023   HGB 16.0 03/14/2023   HCT 45.3 03/14/2023   MCV 88.5 03/14/2023   PLT 209 03/14/2023   NEUTROABS 3.6 03/14/2023    Imaging:  No results found.  Medications: I have reviewed the patient's current medications.  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 and areas spacious for invasion Well to moderately differentiated adenocarcinoma, tumor invades through muscularis propria, no lymphovascular or perineural invasion, negative margins,  no macroscopic tumor perforation, no tumor deposits, 1/14 nodes, no loss of mismatch repair protein expression Normal preoperative CEA CTs 10/31/2022-sigmoid wall thickening, no evidence of metastatic disease, calcified right hilar lymph nodes and 14 mm right upper lobe nodule with adjacent tiny nodules favored to reflect prior granulomatous disease Cycle 1 CAPOX 01/11/2023 Cycle 2 CAPOX 01/31/2023 Cycle 3 CAPOX 02/22/2023 Cycle 4 CAPOX 03/14/2023, oxaliplatin held due to neuropathy 2.  Multiple colon polyps removed on colonoscopy 10/19/2022-tubular adenomas and tubulovillous adenomas 3.  Nodular left thyroid on CT 10/31/2022 Thyroid ultrasound 11/29/2022-multinodular goiter, 3 nodules meet criteria for 1 year follow-up imaging   4.  Diabetes 5.  Hypertension 6.  Admission with GI bleeding following the colonoscopy 10/19/2022 7.  Hyperlipidemia  Disposition: Brandon Ruiz appears stable.  He has completed 3 cycles of CAPOX.  He is experiencing neuropathy symptoms affecting his ability to work.  We decided to hold oxaliplatin with this cycle, proceed with capecitabine alone.  He agrees with this plan.  CBC and chemistry panel reviewed.  Labs adequate to proceed as above.  He will return for follow-up in 3 to 4 weeks.  Patient seen with Dr. Truett Perna.    Lonna Cobb ANP/GNP-BC   03/14/2023  11:12 AM  This was a shared visit with Lonna Cobb.  Brandon Ruiz has completed 3 cycles of CAPOX.  He is developing oxaliplatin neuropathy.  He has persistent neuropathy symptoms today.  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  He has difficulty buttoning his shirt.  We discussed the risk versus benefit of continuing oxaliplatin.  I am concerned he may develop more severe and  long-lasting neuropathy with further oxaliplatin.  I recommend discontinuing oxaliplatin.  He will complete a cycle of capecitabine beginning today.  He will return for an office visit in 1 month.  We will discuss plans  for Port-A-Cath removal and restaging when he is here next month.  I was present for greater than 50% of today's visit.  I performed medical decision making.  Mancel Bale, MD

## 2023-03-14 NOTE — Progress Notes (Signed)
Patient seen by Lonna Cobb NP today  Vitals are within treatment parameters.  Labs reviewed by Lonna Cobb NP and are within treatment parameters.  Per physician team, patient will not be receiving treatment today.

## 2023-03-17 LAB — GENETIC SCREENING ORDER

## 2023-03-22 ENCOUNTER — Other Ambulatory Visit: Payer: Self-pay | Admitting: Internal Medicine

## 2023-03-22 ENCOUNTER — Ambulatory Visit: Payer: Managed Care, Other (non HMO)

## 2023-03-22 DIAGNOSIS — M25511 Pain in right shoulder: Secondary | ICD-10-CM

## 2023-03-22 DIAGNOSIS — M25611 Stiffness of right shoulder, not elsewhere classified: Secondary | ICD-10-CM | POA: Diagnosis not present

## 2023-03-22 DIAGNOSIS — G8929 Other chronic pain: Secondary | ICD-10-CM | POA: Diagnosis not present

## 2023-03-22 DIAGNOSIS — M19111 Post-traumatic osteoarthritis, right shoulder: Secondary | ICD-10-CM | POA: Insufficient documentation

## 2023-03-23 ENCOUNTER — Encounter: Payer: Self-pay | Admitting: Genetic Counselor

## 2023-03-23 DIAGNOSIS — Z1589 Genetic susceptibility to other disease: Secondary | ICD-10-CM | POA: Insufficient documentation

## 2023-03-23 DIAGNOSIS — Z1379 Encounter for other screening for genetic and chromosomal anomalies: Secondary | ICD-10-CM | POA: Insufficient documentation

## 2023-03-27 ENCOUNTER — Other Ambulatory Visit: Payer: Self-pay

## 2023-04-04 ENCOUNTER — Ambulatory Visit: Payer: Managed Care, Other (non HMO) | Admitting: Orthopedic Surgery

## 2023-04-04 ENCOUNTER — Encounter: Payer: Self-pay | Admitting: Internal Medicine

## 2023-04-04 DIAGNOSIS — M25511 Pain in right shoulder: Secondary | ICD-10-CM

## 2023-04-05 ENCOUNTER — Other Ambulatory Visit: Payer: Self-pay | Admitting: Internal Medicine

## 2023-04-05 ENCOUNTER — Encounter: Payer: Self-pay | Admitting: Orthopedic Surgery

## 2023-04-05 DIAGNOSIS — Z794 Long term (current) use of insulin: Secondary | ICD-10-CM

## 2023-04-05 DIAGNOSIS — R5382 Chronic fatigue, unspecified: Secondary | ICD-10-CM

## 2023-04-05 NOTE — Progress Notes (Signed)
Office Visit Note   Patient: Brandon Ruiz           Date of Birth: 07-03-1975           MRN: [id number] Visit Date: 04/04/2023 Requested by: Etta Grandchild, MD [id number] [place],  [place] [address] PCP: Etta Grandchild, MD  Subjective: Chief Complaint  Patient presents with   Right Shoulder - Pain    HPI: [name] is a 47 y.o. male who presents to the office reporting right shoulder pain 1 year duration.  He works as a Land.  He states that he fell through a floor and caught himself with his arms.  Hard for him to raise that arm very far in front of him.  Does not take anything for pain.  Did not really have much bruising at the time of his injury.  Does have a history of left shoulder dislocation earlier in his life.  Did report some mechanical symptoms in the right shoulder following the fall.  Describes both pain and weakness..                ROS: All systems reviewed are negative as they relate to the chief complaint within the history of present illness.  Patient denies fevers or chills.  Assessment & Plan: Visit Diagnoses:  1. Right shoulder pain, unspecified chronicity     Plan: Impression is right shoulder subscapularis tear with likely medial dislocation of the biceps.  He may also have a component of supraspinatus pathology.  Plan is MRI arthrogram of the right shoulder to evaluate the subscap tear as well as medial biceps problem.  Follow-up after that study.  Follow-Up Instructions: No follow-ups on file.   Orders:  Orders Placed This Encounter  Procedures   MR SHOULDER RIGHT W CONTRAST   Arthrogram   No orders of the defined types were placed in this encounter.     Procedures: No procedures performed   Clinical Data: No additional findings.  Objective: Vital Signs: There were no vitals taken for this visit.  Physical Exam:  Constitutional: Patient appears well-developed HEENT:  Head: Normocephalic Eyes:EOM are normal Neck: Normal range  of motion Cardiovascular: Normal rate Pulmonary/chest: Effort normal Neurologic: Patient is alert Skin: Skin is warm Psychiatric: Patient has normal mood and affect  Ortho Exam: Ortho exam demonstrates weakness subscap testing on the right compared to the left.  No Popeye deformity is present.  Passive range of motion is increased on the right with 90 degrees of external rotation compared to 60 on the left.  Abduction and forward flexion are symmetric.  Infraspinatus and supraspinatus testing intact on the right and left-hand side.  Mild coarseness with internal/external rotation on the right at 90 degrees of abduction.  Specialty Comments:  No specialty comments available.  Imaging: No results found.   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/2023   Post-traumatic osteoarthritis of right shoulder 03/22/2023   Chronic fatigue 03/07/2023   Chronic right shoulder pain 02/28/2023   Decreased ROM of right shoulder 02/28/2023   Port-A-Cath in place 01/11/2023   Colon cancer (HCC) 12/11/2022   Thyroid nodule 11/01/2022   Tachycardia 11/01/2022   Hemorrhage of colon following colonoscopy 10/20/2022   Insulin-requiring or dependent type II diabetes mellitus (HCC) 08/03/2022   Primary hypertension 08/03/2022   Dyslipidemia, goal LDL below 100 08/03/2022   Encounter for general adult medical examination with abnormal  findings 08/03/2022   Microalbuminuria due to type 2 diabetes mellitus (HCC) 08/03/2022   Loud snoring 08/03/2022   Attention deficit hyperactivity disorder (ADHD), predominantly inattentive type 07/01/2019   Past Medical History:  Diagnosis Date   Anemia    Diabetes mellitus without complication (HCC)    Hyperlipidemia    Hypertension    RBBB     Family History  Problem Relation Age of Onset   Lung cancer Father    Colon polyps Neg Hx    Colon cancer Neg Hx    Esophageal cancer Neg Hx     Stomach cancer Neg Hx    Rectal cancer Neg Hx     Past Surgical History:  Procedure Laterality Date   APPENDECTOMY     COLONOSCOPY     INCISION AND DRAINAGE OF WOUND Right 05/22/2022   Procedure: IRRIGATION AND DEBRIDEMENT RIGHT GROIN;  Surgeon: Kinsinger, De Blanch, MD;  Location: [facility];  Service: General;  Laterality: Right;   IR IMAGING GUIDED PORT INSERTION  01/08/2023   TONSILLECTOMY     Social History   Occupational History   Not on file  Tobacco Use   Smoking status: Never   Smokeless tobacco: Never  Vaping Use   Vaping status: Never Used  Substance and Sexual Activity   Alcohol use: Yes    Alcohol/week: 3.0 standard drinks of alcohol    Types: 3 Cans of beer per week   Drug use: Never   Sexual activity: Yes    Partners: Female

## 2023-04-06 ENCOUNTER — Other Ambulatory Visit: Payer: Self-pay | Admitting: Nurse Practitioner

## 2023-04-06 DIAGNOSIS — C187 Malignant neoplasm of sigmoid colon: Secondary | ICD-10-CM

## 2023-04-07 ENCOUNTER — Encounter: Payer: Self-pay | Admitting: Orthopedic Surgery

## 2023-04-11 ENCOUNTER — Inpatient Hospital Stay: Payer: Managed Care, Other (non HMO)

## 2023-04-11 ENCOUNTER — Inpatient Hospital Stay: Payer: Managed Care, Other (non HMO) | Admitting: Oncology

## 2023-04-12 ENCOUNTER — Other Ambulatory Visit: Payer: Self-pay

## 2023-04-12 ENCOUNTER — Encounter: Payer: Self-pay | Admitting: Internal Medicine

## 2023-04-12 ENCOUNTER — Other Ambulatory Visit (HOSPITAL_COMMUNITY): Payer: Self-pay

## 2023-04-13 ENCOUNTER — Other Ambulatory Visit: Payer: Self-pay | Admitting: Internal Medicine

## 2023-04-13 DIAGNOSIS — I10 Essential (primary) hypertension: Secondary | ICD-10-CM

## 2023-04-13 DIAGNOSIS — E119 Type 2 diabetes mellitus without complications: Secondary | ICD-10-CM

## 2023-04-13 DIAGNOSIS — R000 Tachycardia, unspecified: Secondary | ICD-10-CM

## 2023-04-13 MED ORDER — METOPROLOL SUCCINATE ER 25 MG PO TB24: 25.0000 mg | ORAL_TABLET | Freq: Every day | ORAL | 0 refills | Status: DC

## 2023-04-13 MED ORDER — DEXCOM G7 SENSOR MISC
1.0000 | Freq: Every day | 1 refills | Status: DC
Start: 2023-04-13 — End: 2023-09-21

## 2023-04-13 MED ORDER — DEXCOM G7 RECEIVER DEVI
1.0000 | Freq: Every day | 1 refills | Status: DC
Start: 2023-04-13 — End: 2023-09-21

## 2023-04-14 ENCOUNTER — Other Ambulatory Visit: Payer: Self-pay | Admitting: Internal Medicine

## 2023-04-14 DIAGNOSIS — E1129 Type 2 diabetes mellitus with other diabetic kidney complication: Secondary | ICD-10-CM

## 2023-04-14 DIAGNOSIS — E785 Hyperlipidemia, unspecified: Secondary | ICD-10-CM

## 2023-04-14 DIAGNOSIS — I10 Essential (primary) hypertension: Secondary | ICD-10-CM

## 2023-04-14 MED ORDER — EMPAGLIFLOZIN 10 MG PO TABS
10.0000 mg | ORAL_TABLET | Freq: Every day | ORAL | 0 refills | Status: DC
Start: 2023-04-14 — End: 2023-09-21

## 2023-04-14 MED ORDER — ROSUVASTATIN CALCIUM 10 MG PO TABS: 10.0000 mg | ORAL_TABLET | Freq: Every day | ORAL | 0 refills | Status: DC

## 2023-04-14 MED ORDER — OLMESARTAN MEDOXOMIL 20 MG PO TABS: 20.0000 mg | ORAL_TABLET | Freq: Every day | ORAL | 0 refills | Status: DC

## 2023-04-14 MED ORDER — METFORMIN HCL 1000 MG PO TABS: 1000.0000 mg | ORAL_TABLET | Freq: Two times a day (BID) | ORAL | 0 refills | Status: DC

## 2023-04-17 ENCOUNTER — Other Ambulatory Visit: Payer: Self-pay

## 2023-04-19 ENCOUNTER — Inpatient Hospital Stay: Payer: Managed Care, Other (non HMO)

## 2023-04-19 ENCOUNTER — Inpatient Hospital Stay: Payer: Managed Care, Other (non HMO) | Attending: Oncology

## 2023-04-19 ENCOUNTER — Inpatient Hospital Stay: Payer: Managed Care, Other (non HMO) | Admitting: Oncology

## 2023-04-19 VITALS — BP 131/102 | HR 69 | Temp 98.1°F | Resp 18 | Ht 76.0 in | Wt 253.6 lb

## 2023-04-19 DIAGNOSIS — C187 Malignant neoplasm of sigmoid colon: Secondary | ICD-10-CM | POA: Diagnosis not present

## 2023-04-19 DIAGNOSIS — G620 Drug-induced polyneuropathy: Secondary | ICD-10-CM | POA: Diagnosis not present

## 2023-04-19 DIAGNOSIS — T451X5A Adverse effect of antineoplastic and immunosuppressive drugs, initial encounter: Secondary | ICD-10-CM | POA: Insufficient documentation

## 2023-04-19 LAB — CBC WITH DIFFERENTIAL (CANCER CENTER ONLY)
Abs Immature Granulocytes: 0.01 10*3/uL (ref 0.00–0.07)
Basophils Absolute: 0.1 10*3/uL (ref 0.0–0.1)
Basophils Relative: 1 %
Eosinophils Absolute: 0.2 10*3/uL (ref 0.0–0.5)
Eosinophils Relative: 2 %
HCT: 46.5 % (ref 39.0–52.0)
Hemoglobin: 16.3 g/dL (ref 13.0–17.0)
Immature Granulocytes: 0 %
Lymphocytes Relative: 33 %
Lymphs Abs: 2.4 10*3/uL (ref 0.7–4.0)
MCH: 32.8 pg (ref 26.0–34.0)
MCHC: 35.1 g/dL (ref 30.0–36.0)
MCV: 93.6 fL (ref 80.0–100.0)
Monocytes Absolute: 0.6 10*3/uL (ref 0.1–1.0)
Monocytes Relative: 9 %
Neutro Abs: 3.9 10*3/uL (ref 1.7–7.7)
Neutrophils Relative %: 55 %
Platelet Count: 228 10*3/uL (ref 150–400)
RBC: 4.97 MIL/uL (ref 4.22–5.81)
RDW: 16.3 % — ABNORMAL HIGH (ref 11.5–15.5)
WBC Count: 7.1 10*3/uL (ref 4.0–10.5)
nRBC: 0 % (ref 0.0–0.2)

## 2023-04-19 LAB — CMP (CANCER CENTER ONLY)
ALT: 36 U/L (ref 0–44)
AST: 24 U/L (ref 15–41)
Albumin: 4.5 g/dL (ref 3.5–5.0)
Alkaline Phosphatase: 111 U/L (ref 38–126)
Anion gap: 8 (ref 5–15)
BUN: 13 mg/dL (ref 6–20)
CO2: 26 mmol/L (ref 22–32)
Calcium: 9.8 mg/dL (ref 8.9–10.3)
Chloride: 105 mmol/L (ref 98–111)
Creatinine: 0.82 mg/dL (ref 0.61–1.24)
GFR, Estimated: >60 mL/min (ref >60)
Glucose, Bld: 175 mg/dL — ABNORMAL HIGH (ref 70–99)
Potassium: 4 mmol/L (ref 3.5–5.1)
Sodium: 139 mmol/L (ref 135–145)
Total Bilirubin: 1 mg/dL (ref 0.3–1.2)
Total Protein: 7.9 g/dL (ref 6.5–8.1)

## 2023-04-19 NOTE — Progress Notes (Signed)
[facility] OFFICE PROGRESS NOTE   Diagnosis: Colon cancer  INTERVAL HISTORY:   Brandon Ruiz completed a cycle of Xeloda beginning 03/14/2023.  Oxaliplatin was held due to neuropathy symptoms.  No mouth sores, diarrhea, or hand/foot pain.  He reports near complete resolution of neuropathy symptoms.  He has a cold sensation at the third and fourth fingers of both hands when waking up in the morning.  This resolves after approximately 45 minutes.  No other neuropathy symptoms.   Objective:  Vital signs in last 24 hours:  Blood pressure (!) 131/102, pulse 69, temperature 98.1 F (36.7 C), temperature source Oral, resp. rate 18, height 6\' 4"  (1.93 m), weight 253 lb 9.6 oz (115 kg), SpO2 100%.    HEENT: No thrush or ulcers Lymphatics: No cervical, supraclavicular, axillary, or inguinal nodes Resp: Clear bilaterally with distant breath sounds Cardio: Regular rate and rhythm GI: No hepatosplenomegaly Vascular: No leg edema  Skin: Soles without erythema  Portacath/PICC-without erythema  Lab Results:  Lab Results  Component Value Date   WBC 7.1 04/19/2023   HGB 16.3 04/19/2023   HCT 46.5 04/19/2023   MCV 93.6 04/19/2023   PLT 228 04/19/2023   NEUTROABS 3.9 04/19/2023    CMP  Lab Results  Component Value Date   NA 139 04/19/2023   K 4.0 04/19/2023   CL 105 04/19/2023   CO2 26 04/19/2023   GLUCOSE 175 (H) 04/19/2023   BUN 13 04/19/2023   CREATININE 0.82 04/19/2023   CALCIUM 9.8 04/19/2023   PROT 7.9 04/19/2023   ALBUMIN 4.5 04/19/2023   AST 24 04/19/2023   ALT 36 04/19/2023   ALKPHOS 111 04/19/2023   BILITOT 1.0 04/19/2023   GFRNONAA >60 04/19/2023    Lab Results  Component Value Date   CEA1 1.5 10/20/2022   CEA 1.54 03/14/2023     Medications: I have reviewed the patient's current medications.   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 Colonoscopy 10/19/2022, mass in the  sigmoid colon at 30 cm, biopsy-superficial fragments of ulcerated tubulovillous adenoma with extensive high-grade dysplasia and areas spacious for invasion Well to moderately differentiated adenocarcinoma, tumor invades through muscularis propria, no lymphovascular or perineural invasion, negative margins, no macroscopic tumor perforation, no tumor deposits, 1/14 nodes, no loss of mismatch repair protein expression Normal preoperative CEA CTs 10/31/2022-sigmoid wall thickening, no evidence of metastatic disease, calcified right hilar lymph nodes and 14 mm right upper lobe nodule with adjacent tiny nodules favored to reflect prior granulomatous disease Cycle 1 CAPOX 01/11/2023 Cycle 2 CAPOX 01/31/2023 Cycle 3 CAPOX 02/22/2023 Cycle 4 CAPOX 03/14/2023, oxaliplatin held due to neuropathy 2.  Multiple colon polyps removed on colonoscopy 10/19/2022-tubular adenomas and tubulovillous adenomas 3.  Nodular left thyroid on CT 10/31/2022 Thyroid ultrasound 11/29/2022-multinodular goiter, 3 nodules meet criteria for 1 year follow-up imaging   4.  Diabetes 5.  Hypertension 6.  Admission with GI bleeding following the colonoscopy 10/19/2022 7.  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  Oxaliplatin was held with cycle 4.  Neuropathy symptoms are much improved.  The plan is to follow him with observation.  He would like the Port-A-Cath removed.  He will undergo surveillance CTs in December.  He will return for an office visit and CEA in 3 months.  We discussed the indication to consider additional capecitabine since he only received 3 cycles of oxaliplatin.  We discussed the potential benefit of  additional capecitabine.  I will confer with my GI oncology expert colleague and get back to Dr.Bochicchio.  He is comfortable being followed with observation.  Brandon Papas, MD  04/19/2023  2:34 PM

## 2023-04-20 ENCOUNTER — Other Ambulatory Visit: Payer: Managed Care, Other (non HMO)

## 2023-04-20 ENCOUNTER — Other Ambulatory Visit: Payer: Self-pay

## 2023-04-20 ENCOUNTER — Telehealth: Payer: Self-pay | Admitting: *Deleted

## 2023-04-20 ENCOUNTER — Other Ambulatory Visit (HOSPITAL_COMMUNITY): Payer: Self-pay

## 2023-04-20 DIAGNOSIS — C187 Malignant neoplasm of sigmoid colon: Secondary | ICD-10-CM

## 2023-04-20 LAB — PROSTATE-SPECIFIC AG, SERUM (LABCORP): Prostate Specific Ag, Serum: 1 ng/mL (ref 0.0–4.0)

## 2023-04-20 LAB — TESTOSTERONE: Testosterone: 297 ng/dL (ref 264–916)

## 2023-04-20 MED ORDER — CAPECITABINE 500 MG PO TABS
ORAL_TABLET | ORAL | 0 refills | Status: DC
  Filled 2023-04-20 (×2): qty 126, 21d supply, fill #0
  Filled 2023-04-20: qty 126, fill #0

## 2023-04-20 NOTE — Telephone Encounter (Signed)
Called significant other, Mandy to report that Dr. Truett Perna spoke with Dr. Maryruth Hancock and he recommends 4 more cycles of Xeloda at prior dosing. OK to remove port now. Will discuss timing of CT scan at next appointment. Will also await DNA testing after chemo is completed. Mr. Goodridge came to office and above was relayed to him. He will call [facility] pharmacy to pick up his medication today.

## 2023-04-20 NOTE — Progress Notes (Signed)
Specialty Pharmacy Ongoing Clinical Assessment Note  Brandon Ruiz is a 47 y.o. male who is being followed by the specialty pharmacy service for RxSp Oncology   Patient's specialty medication(s) reviewed today: Capecitabine   Missed doses in the last 4 weeks: 0   Patient/Caregiver did not have any additional questions or concerns.   Therapeutic benefit summary: Patient is achieving benefit   Adverse events/side effects summary: No adverse events/side effects   Patient's therapy is appropriate to: Continue    Goals Addressed             This Visit's Progress    Slow Disease Progression       Patient is on track. Patient will maintain adherence and adhere to provider and/or lab appointments. Per provider notes from 04/19/23 patient is stable and due for follow up scans in December. Treatment is appropriate to continue.          Follow up:  3 months  Otto Herb Specialty Pharmacist

## 2023-04-20 NOTE — Progress Notes (Signed)
Specialty Pharmacy Refill Coordination Note  Brandon Ruiz is a 47 y.o. male contacted today regarding refills of specialty medication(s) Capecitabine   Patient requested [name] at [facility] [place] date: 04/20/23   Medication will be filled on 04/20/23.

## 2023-04-21 ENCOUNTER — Other Ambulatory Visit: Payer: Self-pay

## 2023-04-23 ENCOUNTER — Encounter: Payer: Self-pay | Admitting: Orthopedic Surgery

## 2023-04-30 ENCOUNTER — Other Ambulatory Visit: Payer: Self-pay | Admitting: Internal Medicine

## 2023-04-30 ENCOUNTER — Encounter: Payer: Self-pay | Admitting: Internal Medicine

## 2023-04-30 DIAGNOSIS — E291 Testicular hypofunction: Secondary | ICD-10-CM | POA: Insufficient documentation

## 2023-04-30 MED ORDER — XYOSTED 75 MG/0.5ML ~~LOC~~ SOAJ: 75.0000 mg | SUBCUTANEOUS | 1 refills | Status: DC

## 2023-05-01 ENCOUNTER — Other Ambulatory Visit: Payer: Self-pay

## 2023-05-02 ENCOUNTER — Other Ambulatory Visit: Payer: Self-pay

## 2023-05-02 ENCOUNTER — Other Ambulatory Visit: Payer: Self-pay | Admitting: Oncology

## 2023-05-02 ENCOUNTER — Ambulatory Visit: Payer: Managed Care, Other (non HMO) | Admitting: Orthopedic Surgery

## 2023-05-02 ENCOUNTER — Encounter: Payer: Self-pay | Admitting: Orthopedic Surgery

## 2023-05-02 DIAGNOSIS — M25511 Pain in right shoulder: Secondary | ICD-10-CM | POA: Diagnosis not present

## 2023-05-02 MED ORDER — CAPECITABINE 500 MG PO TABS
ORAL_TABLET | ORAL | 0 refills | Status: DC
  Filled 2023-05-02: qty 126, 21d supply, fill #0

## 2023-05-02 NOTE — Progress Notes (Signed)
Office Visit Note   Patient: Brandon Ruiz           Date of Birth: 10-06-1975           MRN: [id number] Visit Date: 05/02/2023 Requested by: Etta Grandchild, MD [address] [place],  [place] [address] PCP: Etta Grandchild, MD  Subjective: Chief Complaint  Patient presents with   Right Shoulder - Follow-up    HPI: Brandon Ruiz is a 47 y.o. male who presents to the office reporting continued right shoulder pain.  Here to review MRI scan but for unclear reasons this was denied by insurance.  He is not doing any better.  Does report weakness.  He is right-hand dominant.  He cannot really work and do his manipulations and adjustments effectively because of this right shoulder pain and weakness.  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                ROS: All systems reviewed are negative as they relate to the chief complaint within the history of present illness.  Patient denies fevers or chills.  Assessment & Plan: Visit Diagnoses:  1. Right shoulder pain, unspecified chronicity     Plan: Impression is subscap rupture with biceps tendon subluxation and possible supraspinatus tearing as well.  Ultrasound examination demonstrates absence of biceps tendon in the groove as well as likely near complete rupture of the subscap.  At this time MRI arthrogram is indicated to evaluate the amount of retraction of the subscapularis as well as the amount of atrophy in the muscle which will guide surgical decision making.  Biceps tendon will also need to be tenodesed.  Supraspinatus may also be involved in this tear.  His deltoid is functional.  Unequivocally MRI scanning is indicated at this time in order to assess the amount of retraction and atrophy in his torn rotator cuff.  He has failed rehabilitation and this is simply not going to get better on its own.  Classic physical exam findings including external rotation to 90 degrees passively on the right shoulder compared to 40  degrees on the left as well as ultrasound evidence demonstrating no biceps tendon in the groove are clear objective indicators that further imaging is indicated.  Follow-Up Instructions: No follow-ups on file.   Orders:  Orders Placed This Encounter  Procedures   MR SHOULDER RIGHT W CONTRAST   Arthrogram   No orders of the defined types were placed in this encounter.     Procedures: No procedures performed   Clinical Data: No additional findings.  Objective: Vital Signs: There were no vitals taken for this visit.  Physical Exam:  Constitutional: Patient appears well-developed HEENT:  Head: Normocephalic Eyes:EOM are normal Neck: Normal range of motion Cardiovascular: Normal rate Pulmonary/chest: Effort normal Neurologic: Patient is alert Skin: Skin is warm Psychiatric: Patient has normal mood and affect  Ortho Exam: Ortho exam demonstrates passive external rotation on the right to 90 on the left to 40.  Subscap strength is significantly diminished to 3- out of 5 on the right compared to 5+ out of 5 on the left.  Does have clicking with internal/external rotation at 90 degrees of abduction which made the biceps tendon related.  Does have some biceps tenderness in the groove on the right negative on the left.  No AC joint tenderness to direct palpation.  Cervical spine range of motion intact.  Specialty Comments:  No specialty comments available.  Imaging:  No results found.   PMFS History: Patient Active Problem List   Diagnosis Date Noted   Hypogonadism, male 04/30/2023   Genetic testing 03/23/2023   CHEK2 positive 03/23/2023   Post-traumatic osteoarthritis of right shoulder 03/22/2023   Port-A-Cath in place 01/11/2023   Colon cancer (HCC) 12/11/2022   Thyroid nodule 11/01/2022   Insulin-requiring or dependent type II diabetes mellitus (HCC) 08/03/2022   Primary hypertension 08/03/2022   Dyslipidemia, goal LDL below 100 08/03/2022   Encounter for general adult  medical examination with abnormal findings 08/03/2022   Microalbuminuria due to type 2 diabetes mellitus (HCC) 08/03/2022   Loud snoring 08/03/2022   Attention deficit hyperactivity disorder (ADHD), predominantly inattentive type 07/01/2019   Past Medical History:  Diagnosis Date   Anemia    Diabetes mellitus without complication (HCC)    Hyperlipidemia    Hypertension    RBBB     Family History  Problem Relation Age of Onset   Lung cancer Father    Colon polyps Neg Hx    Colon cancer Neg Hx    Esophageal cancer Neg Hx    Stomach cancer Neg Hx    Rectal cancer Neg Hx     Past Surgical History:  Procedure Laterality Date   APPENDECTOMY     COLONOSCOPY     INCISION AND DRAINAGE OF WOUND Right 05/22/2022   Procedure: IRRIGATION AND DEBRIDEMENT RIGHT GROIN;  Surgeon: Kinsinger, De Blanch, MD;  Location: [facility];  Service: General;  Laterality: Right;   IR IMAGING GUIDED PORT INSERTION  01/08/2023   TONSILLECTOMY     Social History   Occupational History   Not on file  Tobacco Use   Smoking status: Never   Smokeless tobacco: Never  Vaping Use   Vaping status: Never Used  Substance and Sexual Activity   Alcohol use: Yes    Alcohol/week: 3.0 standard drinks of alcohol    Types: 3 Cans of beer per week   Drug use: Never   Sexual activity: Yes    Partners: Female

## 2023-05-02 NOTE — Progress Notes (Signed)
Clinical Intervention Note  Clinical Intervention Notes: Patient initiated treatment with Xyosted. No DDIs identified with capecitabine.   Clinical Intervention Outcomes: Prevention of an adverse drug event   Otto Herb Specialty Pharmacist

## 2023-05-02 NOTE — Progress Notes (Signed)
Specialty Pharmacy Refill Coordination Note  Brandon Ruiz is a 47 y.o. male contacted today regarding refills of specialty medication(s) Capecitabine   Patient requested Delivery   Delivery date: 05/10/23   Verified address: [address]   [place] [address]   Medication will be filled on 05/09/23, pending refill request.

## 2023-05-07 ENCOUNTER — Other Ambulatory Visit (HOSPITAL_COMMUNITY): Payer: Self-pay

## 2023-05-08 ENCOUNTER — Ambulatory Visit: Payer: Managed Care, Other (non HMO) | Admitting: Nurse Practitioner

## 2023-05-08 ENCOUNTER — Other Ambulatory Visit: Payer: Managed Care, Other (non HMO)

## 2023-05-09 ENCOUNTER — Encounter: Payer: Self-pay | Admitting: Nurse Practitioner

## 2023-05-09 ENCOUNTER — Inpatient Hospital Stay: Payer: Managed Care, Other (non HMO) | Attending: Oncology

## 2023-05-09 ENCOUNTER — Other Ambulatory Visit (HOSPITAL_COMMUNITY): Payer: Managed Care, Other (non HMO)

## 2023-05-09 ENCOUNTER — Other Ambulatory Visit: Payer: Self-pay

## 2023-05-09 ENCOUNTER — Inpatient Hospital Stay (HOSPITAL_BASED_OUTPATIENT_CLINIC_OR_DEPARTMENT_OTHER): Payer: Managed Care, Other (non HMO) | Admitting: Nurse Practitioner

## 2023-05-09 VITALS — BP 145/100 | HR 100 | Temp 98.1°F | Resp 18 | Ht 76.0 in | Wt 256.6 lb

## 2023-05-09 DIAGNOSIS — G620 Drug-induced polyneuropathy: Secondary | ICD-10-CM | POA: Diagnosis not present

## 2023-05-09 DIAGNOSIS — C187 Malignant neoplasm of sigmoid colon: Secondary | ICD-10-CM | POA: Insufficient documentation

## 2023-05-09 LAB — CMP (CANCER CENTER ONLY)
ALT: 15 U/L (ref 0–44)
AST: 14 U/L — ABNORMAL LOW (ref 15–41)
Albumin: 4.5 g/dL (ref 3.5–5.0)
Alkaline Phosphatase: 118 U/L (ref 38–126)
Anion gap: 7 (ref 5–15)
BUN: 9 mg/dL (ref 6–20)
CO2: 26 mmol/L (ref 22–32)
Calcium: 9.3 mg/dL (ref 8.9–10.3)
Chloride: 104 mmol/L (ref 98–111)
Creatinine: 0.78 mg/dL (ref 0.61–1.24)
GFR, Estimated: >60 mL/min (ref >60)
Glucose, Bld: 289 mg/dL — ABNORMAL HIGH (ref 70–99)
Potassium: 4.1 mmol/L (ref 3.5–5.1)
Sodium: 137 mmol/L (ref 135–145)
Total Bilirubin: 0.9 mg/dL (ref <1.2)
Total Protein: 7 g/dL (ref 6.5–8.1)

## 2023-05-09 LAB — CBC WITH DIFFERENTIAL (CANCER CENTER ONLY)
Abs Immature Granulocytes: 0.04 10*3/uL (ref 0.00–0.07)
Basophils Absolute: 0.1 10*3/uL (ref 0.0–0.1)
Basophils Relative: 1 %
Eosinophils Absolute: 0.2 10*3/uL (ref 0.0–0.5)
Eosinophils Relative: 2 %
HCT: 43.7 % (ref 39.0–52.0)
Hemoglobin: 15.6 g/dL (ref 13.0–17.0)
Immature Granulocytes: 1 %
Lymphocytes Relative: 21 %
Lymphs Abs: 1.9 10*3/uL (ref 0.7–4.0)
MCH: 33.2 pg (ref 26.0–34.0)
MCHC: 35.7 g/dL (ref 30.0–36.0)
MCV: 93 fL (ref 80.0–100.0)
Monocytes Absolute: 0.8 10*3/uL (ref 0.1–1.0)
Monocytes Relative: 9 %
Neutro Abs: 6 10*3/uL (ref 1.7–7.7)
Neutrophils Relative %: 66 %
Platelet Count: 269 10*3/uL (ref 150–400)
RBC: 4.7 MIL/uL (ref 4.22–5.81)
RDW: 15.1 % (ref 11.5–15.5)
WBC Count: 8.9 10*3/uL (ref 4.0–10.5)
nRBC: 0.2 % (ref 0.0–0.2)

## 2023-05-09 NOTE — Progress Notes (Signed)
  [facility] OFFICE PROGRESS NOTE   Diagnosis: Colon cancer  INTERVAL HISTORY:   Brandon Ruiz returns as scheduled.  Following his last office visit Dr. Truett Ruiz recommended 4 more cycles of Xeloda.  He began the first cycle on 04/23/2023.  He denies nausea/vomiting.  No mouth sores.  No diarrhea.  No hand or foot pain or redness.  Neuropathy symptoms are better.  He notes intermittent numbness/tingling in the third and fourth fingers bilaterally.  Objective:  Vital signs in last 24 hours:  Blood pressure (!) 145/100, pulse 100, temperature 98.1 F (36.7 C), temperature source Temporal, resp. rate 18, height 6\' 4"  (1.93 m), weight 256 lb 9.6 oz (116.4 kg), SpO2 99%.    HEENT: No thrush or ulcers. Resp: Lungs clear bilaterally. Cardio: Regular rate and rhythm. GI: Abdomen soft and nontender.  No hepatosplenomegaly. Vascular: No leg edema. Skin: Palms with mild erythema.  No skin breakdown.  Soles with no erythema. Port-A-Cath without erythema.  Lab Results:  Lab Results  Component Value Date   WBC 7.1 04/19/2023   HGB 16.3 04/19/2023   HCT 46.5 04/19/2023   MCV 93.6 04/19/2023   PLT 228 04/19/2023   NEUTROABS 3.9 04/19/2023    Imaging:  No results found.  Medications: I have reviewed the patient's current medications.  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 and areas spacious for invasion Well to moderately differentiated adenocarcinoma, tumor invades through muscularis propria, no lymphovascular or perineural invasion, negative margins, no macroscopic tumor perforation, no tumor deposits, 1/14 nodes, no loss of mismatch repair protein expression Normal preoperative CEA CTs 10/31/2022-sigmoid wall thickening, no evidence of  metastatic disease, calcified right hilar lymph nodes and 14 mm right upper lobe nodule with adjacent tiny nodules favored to reflect prior granulomatous disease Cycle 1 CAPOX 01/11/2023 Cycle 2 CAPOX 01/31/2023 Cycle 3 CAPOX 02/22/2023 Cycle 4 CAPOX 03/14/2023, oxaliplatin held due to neuropathy Cycle 1 capecitabine 04/23/2023 Cycle 2 capecitabine 05/14/2023 2.  Multiple colon polyps removed on colonoscopy 10/19/2022-tubular adenomas and tubulovillous adenomas 3.  Nodular left thyroid on CT 10/31/2022 Thyroid ultrasound 11/29/2022-multinodular goiter, 3 nodules meet criteria for 1 year follow-up imaging   4.  Diabetes 5.  Hypertension 6.  Admission with GI bleeding following the colonoscopy 10/19/2022 7.  Hyperlipidemia  Disposition: Brandon Ruiz appears stable.  He completed 3 cycles of CAPOX.  With the fourth cycle oxaliplatin was held due to neuropathy.  He completed the capecitabine portion of the regimen with cycle 4.  He is now completing an additional 4 cycles of capecitabine alone.  He seems to be tolerating treatment well.  He will begin cycle 2 capecitabine 05/14/2023.  Neuropathy symptoms are better.  CBC and chemistry panel reviewed.  Labs adequate to proceed as above.  He will return for lab and follow-up in 3 weeks.    Brandon Ruiz ANP/GNP-BC   05/09/2023  2:22 PM

## 2023-05-10 ENCOUNTER — Ambulatory Visit (HOSPITAL_COMMUNITY)
Admission: RE | Admit: 2023-05-10 | Discharge: 2023-05-10 | Disposition: A | Discharge status: Home / Self Care | Payer: Managed Care, Other (non HMO) | Source: Ambulatory Visit | Attending: Oncology | Admitting: Oncology

## 2023-05-10 ENCOUNTER — Ambulatory Visit
Admission: RE | Admit: 2023-05-10 | Discharge: 2023-05-10 | Disposition: A | Discharge status: Home / Self Care | Payer: Managed Care, Other (non HMO) | Source: Ambulatory Visit | Attending: Orthopedic Surgery | Admitting: Orthopedic Surgery

## 2023-05-10 DIAGNOSIS — C187 Malignant neoplasm of sigmoid colon: Secondary | ICD-10-CM | POA: Insufficient documentation

## 2023-05-10 DIAGNOSIS — Z452 Encounter for adjustment and management of vascular access device: Secondary | ICD-10-CM | POA: Diagnosis present

## 2023-05-10 DIAGNOSIS — M25511 Pain in right shoulder: Secondary | ICD-10-CM

## 2023-05-10 HISTORY — PX: IR REMOVAL TUN ACCESS W/ PORT W/O FL MOD SED: IMG2290

## 2023-05-10 MED ORDER — LIDOCAINE-EPINEPHRINE 1 %-1:100000 IJ SOLN
INTRAMUSCULAR | Status: AC
  Filled 2023-05-10: qty 1

## 2023-05-10 MED ORDER — IOPAMIDOL (ISOVUE-M 200) INJECTION 41%
14.0000 mL | Freq: Once | INTRAMUSCULAR | Status: AC
  Administered 2023-05-10: 14 mL via INTRA_ARTICULAR

## 2023-05-10 MED ORDER — LIDOCAINE-EPINEPHRINE 1 %-1:100000 IJ SOLN
20.0000 mL | Freq: Once | INTRAMUSCULAR | Status: AC
  Administered 2023-05-10: 10 mL via INTRADERMAL

## 2023-05-15 ENCOUNTER — Telehealth: Payer: Self-pay

## 2023-05-15 NOTE — Telephone Encounter (Signed)
This patient states that [facility] has sent a PA for his Grant. Please advise for me and update me at your earliest convenience.

## 2023-05-15 NOTE — Progress Notes (Signed)
I tried calling him with the results but his phone does not allow for messages itches keep scrolling through the same you can leave a message after the be you can leave a message after the be and then you can never leave a message.

## 2023-05-16 ENCOUNTER — Other Ambulatory Visit (HOSPITAL_COMMUNITY): Payer: Self-pay

## 2023-05-16 ENCOUNTER — Telehealth: Payer: Self-pay

## 2023-05-16 NOTE — Telephone Encounter (Signed)
Pharmacy Patient Advocate Encounter   Received notification from Pt Calls Messages that prior authorization for Xyosted 75mg /0.35ml is required/requested.   Insurance verification completed.   The patient is insured through [employer] .   Per test claim: PA required; PA started via CoverMyMeds. KEY [id number] . Please see clinical question(s) below that I am not finding the answer to in her chart and advise.

## 2023-05-18 NOTE — Telephone Encounter (Signed)
 Clinical questions answered and PA submitted

## 2023-05-18 NOTE — Telephone Encounter (Signed)
The answer to the clinical question is "yes"

## 2023-05-21 ENCOUNTER — Other Ambulatory Visit: Payer: Self-pay

## 2023-05-21 NOTE — Telephone Encounter (Signed)
Advise the patient of the denial or are we going to do trial and errors ?

## 2023-05-21 NOTE — Telephone Encounter (Signed)
Pharmacy Patient Advocate Encounter  Received notification from [employer] that Prior Authorization for Xyosted 75mg /0.37ml has been DENIED.  See denial reason below. No denial letter attached in CMM. Will attach denial letter to Media tab once received.   PA #/Case ID/Reference #: [id number]

## 2023-05-23 ENCOUNTER — Other Ambulatory Visit: Payer: Self-pay

## 2023-05-23 ENCOUNTER — Ambulatory Visit: Payer: Managed Care, Other (non HMO) | Admitting: Orthopedic Surgery

## 2023-05-23 ENCOUNTER — Other Ambulatory Visit: Payer: Self-pay | Admitting: Oncology

## 2023-05-23 NOTE — Progress Notes (Signed)
Specialty Pharmacy Refill Coordination Note  Brandon Ruiz is a 47 y.o. male contacted today regarding refills of specialty medication(s) Capecitabine   Patient requested Delivery   Delivery date: 05/30/23   Verified address: [address]   [place] [address]   Medication will be filled on 05/29/23, pending refill request.

## 2023-05-25 ENCOUNTER — Other Ambulatory Visit: Payer: Self-pay | Admitting: Oncology

## 2023-05-25 ENCOUNTER — Other Ambulatory Visit: Payer: Self-pay

## 2023-05-28 ENCOUNTER — Other Ambulatory Visit: Payer: Self-pay

## 2023-05-28 MED ORDER — CAPECITABINE 500 MG PO TABS
ORAL_TABLET | ORAL | 0 refills | Status: DC
  Filled 2023-05-29: qty 126, 21d supply, fill #0

## 2023-05-29 ENCOUNTER — Other Ambulatory Visit (HOSPITAL_COMMUNITY): Payer: Self-pay

## 2023-05-29 ENCOUNTER — Other Ambulatory Visit: Payer: Self-pay

## 2023-05-30 ENCOUNTER — Inpatient Hospital Stay: Payer: Managed Care, Other (non HMO) | Attending: Oncology | Admitting: Nurse Practitioner

## 2023-05-30 ENCOUNTER — Encounter: Payer: Self-pay | Admitting: Nurse Practitioner

## 2023-05-30 ENCOUNTER — Inpatient Hospital Stay: Payer: Managed Care, Other (non HMO)

## 2023-05-30 ENCOUNTER — Ambulatory Visit: Payer: Managed Care, Other (non HMO) | Admitting: Internal Medicine

## 2023-05-30 VITALS — BP 133/93 | HR 108 | Temp 97.9°F | Resp 18 | Ht 76.0 in | Wt 261.6 lb

## 2023-05-30 DIAGNOSIS — E042 Nontoxic multinodular goiter: Secondary | ICD-10-CM | POA: Diagnosis not present

## 2023-05-30 DIAGNOSIS — C187 Malignant neoplasm of sigmoid colon: Secondary | ICD-10-CM

## 2023-05-30 LAB — CMP (CANCER CENTER ONLY)
ALT: 21 U/L (ref 0–44)
AST: 17 U/L (ref 15–41)
Albumin: 4.3 g/dL (ref 3.5–5.0)
Alkaline Phosphatase: 102 U/L (ref 38–126)
Anion gap: 10 (ref 5–15)
BUN: 11 mg/dL (ref 6–20)
CO2: 26 mmol/L (ref 22–32)
Calcium: 9.9 mg/dL (ref 8.9–10.3)
Chloride: 101 mmol/L (ref 98–111)
Creatinine: 0.82 mg/dL (ref 0.61–1.24)
GFR, Estimated: >60 mL/min (ref >60)
Glucose, Bld: 231 mg/dL — ABNORMAL HIGH (ref 70–99)
Potassium: 4.2 mmol/L (ref 3.5–5.1)
Sodium: 137 mmol/L (ref 135–145)
Total Bilirubin: 1 mg/dL (ref <1.2)
Total Protein: 7 g/dL (ref 6.5–8.1)

## 2023-05-30 LAB — CBC WITH DIFFERENTIAL (CANCER CENTER ONLY)
Abs Immature Granulocytes: 0.02 10*3/uL (ref 0.00–0.07)
Basophils Absolute: 0.1 10*3/uL (ref 0.0–0.1)
Basophils Relative: 1 %
Eosinophils Absolute: 0.3 10*3/uL (ref 0.0–0.5)
Eosinophils Relative: 4 %
HCT: 46.6 % (ref 39.0–52.0)
Hemoglobin: 16.7 g/dL (ref 13.0–17.0)
Immature Granulocytes: 0 %
Lymphocytes Relative: 31 %
Lymphs Abs: 2 10*3/uL (ref 0.7–4.0)
MCH: 33.3 pg (ref 26.0–34.0)
MCHC: 35.8 g/dL (ref 30.0–36.0)
MCV: 93 fL (ref 80.0–100.0)
Monocytes Absolute: 0.6 10*3/uL (ref 0.1–1.0)
Monocytes Relative: 10 %
Neutro Abs: 3.4 10*3/uL (ref 1.7–7.7)
Neutrophils Relative %: 54 %
Platelet Count: 213 10*3/uL (ref 150–400)
RBC: 5.01 MIL/uL (ref 4.22–5.81)
RDW: 14.9 % (ref 11.5–15.5)
WBC Count: 6.3 10*3/uL (ref 4.0–10.5)
nRBC: 0 % (ref 0.0–0.2)

## 2023-05-30 NOTE — Progress Notes (Signed)
  [facility] OFFICE PROGRESS NOTE   Diagnosis: Colon cancer  INTERVAL HISTORY:   Brandon Ruiz returns as scheduled.  He completed another cycle of capecitabine beginning 05/14/2023.  He denies nausea/vomiting.  No mouth sores.  No diarrhea.  No hand or foot pain or redness.  Objective:  Vital signs in last 24 hours:  Blood pressure (!) 133/93, pulse (!) 108, temperature 97.9 F (36.6 C), temperature source Temporal, resp. rate 18, height 6\' 4"  (1.93 m), weight 261 lb 9.6 oz (118.7 kg), SpO2 99%.    HEENT: No thrush or ulcers. Resp: Lungs clear bilaterally. Cardio: Regular rate and rhythm. GI: No hepatosplenomegaly. Vascular: No leg edema. Skin: Palms with mild erythema.   Lab Results:  Lab Results  Component Value Date   WBC 6.3 05/30/2023   HGB 16.7 05/30/2023   HCT 46.6 05/30/2023   MCV 93.0 05/30/2023   PLT 213 05/30/2023   NEUTROABS 3.4 05/30/2023    Imaging:  No results found.  Medications: I have reviewed the patient's current medications.  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 and areas spacious for invasion Well to moderately differentiated adenocarcinoma, tumor invades through muscularis propria, no lymphovascular or perineural invasion, negative margins, no macroscopic tumor perforation, no tumor deposits, 1/14 nodes, no loss of mismatch repair protein expression Normal preoperative CEA CTs 10/31/2022-sigmoid wall thickening, no evidence of metastatic disease, calcified right hilar lymph nodes and 14 mm right upper lobe nodule with adjacent tiny nodules favored to reflect prior granulomatous disease Cycle 1 CAPOX 01/11/2023 Cycle 2 CAPOX 01/31/2023 Cycle 3 CAPOX 02/22/2023 Cycle 4 CAPOX 03/14/2023, oxaliplatin held due to  neuropathy Cycle 1 capecitabine 04/23/2023 Cycle 2 capecitabine 05/14/2023 Cycle 3 capecitabine 06/04/2023 2.  Multiple colon polyps removed on colonoscopy 10/19/2022-tubular adenomas and tubulovillous adenomas 3.  Nodular left thyroid on CT 10/31/2022 Thyroid ultrasound 11/29/2022-multinodular goiter, 3 nodules meet criteria for 1 year follow-up imaging   4.  Diabetes 5.  Hypertension 6.  Admission with GI bleeding following the colonoscopy 10/19/2022 7.  Hyperlipidemia  Disposition: Brandon Ruiz appears stable.  He has completed 2 of a planned 4 cycles of single agent capecitabine.  He is tolerating well.  He will begin cycle 3 on 06/04/2023.  CBC and chemistry panel reviewed.  Labs adequate to proceed as above.  He will return for lab and follow-up in 3 weeks.  We are available to see him sooner if needed.    Lonna Cobb ANP/GNP-BC   05/30/2023  11:54 AM

## 2023-05-31 ENCOUNTER — Ambulatory Visit (INDEPENDENT_AMBULATORY_CARE_PROVIDER_SITE_OTHER): Payer: Managed Care, Other (non HMO) | Admitting: Orthopedic Surgery

## 2023-05-31 DIAGNOSIS — S46011D Strain of muscle(s) and tendon(s) of the rotator cuff of right shoulder, subsequent encounter: Secondary | ICD-10-CM | POA: Diagnosis not present

## 2023-06-01 ENCOUNTER — Encounter: Payer: Self-pay | Admitting: Orthopedic Surgery

## 2023-06-01 NOTE — Progress Notes (Signed)
Office Visit Note   Patient: Brandon Ruiz           Date of Birth: [birth year]           MRN: [id number] Visit Date: 05/31/2023 Requested by: Etta Grandchild, MD [address] [place],  [place] [address] PCP: Etta Grandchild, MD  Subjective: Chief Complaint  Patient presents with   Other     Review MRI    HPI: Brandon Ruiz is a 47 y.o. male who presents to the office reporting continued right shoulder symptoms.  He had an injury a year ago where he fell through a ceiling.  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  He likes doing outdoor type activities.  Since he was last seen he has had an MRI scan which confirms our clinical suspicion with prior clinic visit.  He does have full-thickness subscapular rupture with subluxation of the biceps tendon along with surprisingly a retracted.  Supraspinatus tear.  Patient does not report any instability symptoms.              ROS: All systems reviewed are negative as they relate to the chief complaint within the history of present illness.  Patient denies fevers or chills.  Assessment & Plan: Visit Diagnoses:  1. Traumatic complete tear of right rotator cuff, subsequent encounter     Plan: Impression is anterior superior rotator cuff pathology with subluxation of the biceps tendon which may in fact be helping his shoulder stability situation.  He is symptomatic in terms of weakness but not too much in terms of pain from this problem.  Supraspinatus tear is not repairable.  The subscapularis tear also has retracted too much to be repairable but we could perform a tendon transfer of the latissimus for that problem.  That would be about a 11-month hiatus from work to recover.  Patient is going to consider his options and will let me know if he wants to proceed.  At this time he is functioning reasonably well and has accommodated to his rotator cuff deficiency.  He will follow-up with [place] as  needed.  Follow-Up Instructions: No follow-ups on file.   Orders:  No orders of the defined types were placed in this encounter.  No orders of the defined types were placed in this encounter.     Procedures: No procedures performed   Clinical Data: No additional findings.  Objective: Vital Signs: There were no vitals taken for this visit.  Physical Exam:  Constitutional: Patient appears well-developed HEENT:  Head: Normocephalic Eyes:EOM are normal Neck: Normal range of motion Cardiovascular: Normal rate Pulmonary/chest: Effort normal Neurologic: Patient is alert Skin: Skin is warm Psychiatric: Patient has normal mood and affect  Ortho Exam: Ortho examination unchanged from prior visit.  Does have abduction to about 80 to 85 degrees with increased external rotation on the right compared to the left consistent with his subscapular rupture.  Does have weakness to subscap testing on the right compared to the left.  External rotation strength 5+ out of 5 bilaterally.  Not too much in terms of radiating biceps pain on the right-hand side.  Specialty Comments:  No specialty comments available.  Imaging: No results found.   PMFS History: Patient Active Problem List   Diagnosis Date Noted   Hypogonadism, male 04/30/2023   Genetic testing 03/23/2023   CHEK2 positive 03/23/2023   Post-traumatic osteoarthritis of right shoulder 03/22/2023   Port-A-Cath in place 01/11/2023  Colon cancer (HCC) 12/11/2022   Thyroid nodule 11/01/2022   Insulin-requiring or dependent type II diabetes mellitus (HCC) 08/03/2022   Primary hypertension 08/03/2022   Dyslipidemia, goal LDL below 100 08/03/2022   Encounter for general adult medical examination with abnormal findings 08/03/2022   Microalbuminuria due to type 2 diabetes mellitus (HCC) 08/03/2022   Loud snoring 08/03/2022   Attention deficit hyperactivity disorder (ADHD), predominantly inattentive type 07/01/2019   Past Medical  History:  Diagnosis Date   Anemia    Diabetes mellitus without complication (HCC)    Hyperlipidemia    Hypertension    RBBB     Family History  Problem Relation Age of Onset   Lung cancer Father    Colon polyps Neg Hx    Colon cancer Neg Hx    Esophageal cancer Neg Hx    Stomach cancer Neg Hx    Rectal cancer Neg Hx     Past Surgical History:  Procedure Laterality Date   APPENDECTOMY     COLONOSCOPY     INCISION AND DRAINAGE OF WOUND Right 05/22/2022   Procedure: IRRIGATION AND DEBRIDEMENT RIGHT GROIN;  Surgeon: Kinsinger, De Blanch, MD;  Location: [facility];  Service: General;  Laterality: Right;   IR IMAGING GUIDED PORT INSERTION  01/08/2023   IR REMOVAL TUN ACCESS W/ PORT W/O FL MOD SED  05/10/2023   TONSILLECTOMY     Social History   Occupational History   Not on file  Tobacco Use   Smoking status: Never   Smokeless tobacco: Never  Vaping Use   Vaping status: Never Used  Substance and Sexual Activity   Alcohol use: Yes    Alcohol/week: 3.0 standard drinks of alcohol    Types: 3 Cans of beer per week   Drug use: Never   Sexual activity: Yes    Partners: Female

## 2023-06-04 ENCOUNTER — Other Ambulatory Visit: Payer: Self-pay | Admitting: Nurse Practitioner

## 2023-06-04 DIAGNOSIS — C187 Malignant neoplasm of sigmoid colon: Secondary | ICD-10-CM

## 2023-06-11 ENCOUNTER — Encounter: Payer: Self-pay | Admitting: Internal Medicine

## 2023-06-12 ENCOUNTER — Telehealth: Payer: Self-pay

## 2023-06-12 NOTE — Telephone Encounter (Signed)
 Pharmacy Patient Advocate Encounter  Received notification from [employer] that Prior Authorization for Xyosted 75mg /0.37ml has been DENIED.  See denial reason below. No denial letter attached in CMM. Will attach denial letter to Media tab once received.   PA #/Case ID/Reference #: [id number]

## 2023-06-13 NOTE — Telephone Encounter (Signed)
Patient came and picked up some samples.

## 2023-06-14 ENCOUNTER — Encounter (HOSPITAL_BASED_OUTPATIENT_CLINIC_OR_DEPARTMENT_OTHER): Payer: Self-pay

## 2023-06-14 ENCOUNTER — Ambulatory Visit (HOSPITAL_BASED_OUTPATIENT_CLINIC_OR_DEPARTMENT_OTHER)
Admission: RE | Admit: 2023-06-14 | Discharge: 2023-06-14 | Disposition: A | Discharge status: Home / Self Care | Payer: Managed Care, Other (non HMO) | Source: Ambulatory Visit | Attending: Oncology | Admitting: Oncology

## 2023-06-14 DIAGNOSIS — C187 Malignant neoplasm of sigmoid colon: Secondary | ICD-10-CM | POA: Insufficient documentation

## 2023-06-14 MED ORDER — IOHEXOL 300 MG/ML  SOLN
100.0000 mL | Freq: Once | INTRAMUSCULAR | Status: AC | PRN
  Administered 2023-06-14: 100 mL via INTRAVENOUS

## 2023-06-15 ENCOUNTER — Telehealth: Payer: Self-pay | Admitting: *Deleted

## 2023-06-15 NOTE — Telephone Encounter (Signed)
Per Dr.Sherrill, called pt with message below. Pt verbalized understanding.

## 2023-06-15 NOTE — Telephone Encounter (Signed)
-----   Message from Thornton Papas sent at 06/15/2023 12:01 PM EST ----- Please call patient, CTs are negative for recurrent cancer, follow-up as scheduled

## 2023-06-18 ENCOUNTER — Other Ambulatory Visit: Payer: Self-pay

## 2023-06-18 ENCOUNTER — Other Ambulatory Visit: Payer: Managed Care, Other (non HMO)

## 2023-06-18 ENCOUNTER — Inpatient Hospital Stay: Payer: Managed Care, Other (non HMO)

## 2023-06-18 ENCOUNTER — Other Ambulatory Visit: Payer: Self-pay | Admitting: Oncology

## 2023-06-18 ENCOUNTER — Encounter: Payer: Self-pay | Admitting: Internal Medicine

## 2023-06-18 ENCOUNTER — Inpatient Hospital Stay (HOSPITAL_BASED_OUTPATIENT_CLINIC_OR_DEPARTMENT_OTHER): Payer: Managed Care, Other (non HMO) | Admitting: Oncology

## 2023-06-18 VITALS — BP 142/94 | HR 97 | Temp 97.9°F | Resp 18 | Ht 76.0 in | Wt 260.4 lb

## 2023-06-18 DIAGNOSIS — C187 Malignant neoplasm of sigmoid colon: Secondary | ICD-10-CM

## 2023-06-18 LAB — CMP (CANCER CENTER ONLY)
ALT: 23 U/L (ref 0–44)
AST: 21 U/L (ref 15–41)
Albumin: 4.4 g/dL (ref 3.5–5.0)
Alkaline Phosphatase: 98 U/L (ref 38–126)
Anion gap: 8 (ref 5–15)
BUN: 9 mg/dL (ref 6–20)
CO2: 28 mmol/L (ref 22–32)
Calcium: 9.5 mg/dL (ref 8.9–10.3)
Chloride: 101 mmol/L (ref 98–111)
Creatinine: 0.82 mg/dL (ref 0.61–1.24)
GFR, Estimated: >60 mL/min (ref >60)
Glucose, Bld: 206 mg/dL — ABNORMAL HIGH (ref 70–99)
Potassium: 3.7 mmol/L (ref 3.5–5.1)
Sodium: 137 mmol/L (ref 135–145)
Total Bilirubin: 1.3 mg/dL — ABNORMAL HIGH (ref <1.2)
Total Protein: 7.3 g/dL (ref 6.5–8.1)

## 2023-06-18 LAB — CBC WITH DIFFERENTIAL (CANCER CENTER ONLY)
Abs Immature Granulocytes: 0.01 10*3/uL (ref 0.00–0.07)
Basophils Absolute: 0.1 10*3/uL (ref 0.0–0.1)
Basophils Relative: 1 %
Eosinophils Absolute: 0.2 10*3/uL (ref 0.0–0.5)
Eosinophils Relative: 4 %
HCT: 46.6 % (ref 39.0–52.0)
Hemoglobin: 16.6 g/dL (ref 13.0–17.0)
Immature Granulocytes: 0 %
Lymphocytes Relative: 31 %
Lymphs Abs: 1.7 10*3/uL (ref 0.7–4.0)
MCH: 32.9 pg (ref 26.0–34.0)
MCHC: 35.6 g/dL (ref 30.0–36.0)
MCV: 92.5 fL (ref 80.0–100.0)
Monocytes Absolute: 0.6 10*3/uL (ref 0.1–1.0)
Monocytes Relative: 10 %
Neutro Abs: 3.1 10*3/uL (ref 1.7–7.7)
Neutrophils Relative %: 54 %
Platelet Count: 220 10*3/uL (ref 150–400)
RBC: 5.04 MIL/uL (ref 4.22–5.81)
RDW: 14.9 % (ref 11.5–15.5)
WBC Count: 5.7 10*3/uL (ref 4.0–10.5)
nRBC: 0 % (ref 0.0–0.2)

## 2023-06-18 MED ORDER — CAPECITABINE 500 MG PO TABS
ORAL_TABLET | ORAL | 0 refills | Status: DC
  Filled 2023-06-18: qty 126, 21d supply, fill #0

## 2023-06-18 NOTE — Progress Notes (Signed)
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  He began another cycle of Xeloda on 06/04/2023.  No mouth sores, nausea, diarrhea, or hand/foot pain.  He feels well.  He reports a cold feeling at the distal third and fourth fingers bilaterally.  Peripheral numbness remains improved.  Objective:  Vital signs in last 24 hours:  Blood pressure (!) 140/104, pulse 97, temperature 97.9 F (36.6 C), temperature source Temporal, resp. rate 18, height 6\' 4"  (1.93 m), weight 260 lb 6.4 oz (118.1 kg), SpO2 98%.    HEENT: No thrush, 2 mm ulcer at the lateral left tongue Resp: Lungs clear bilaterally, distant breath sounds Cardio: Regular rate and rhythm GI: Nontender, no hepatosplenomegaly Vascular: No leg edema  Skin: Mild paronychia at several fingers  Lab Results:  Lab Results  Component Value Date   WBC 5.7 06/18/2023   HGB 16.6 06/18/2023   HCT 46.6 06/18/2023   MCV 92.5 06/18/2023   PLT 220 06/18/2023   NEUTROABS 3.1 06/18/2023    CMP  Lab Results  Component Value Date   NA 137 05/30/2023   K 4.2 05/30/2023   CL 101 05/30/2023   CO2 26 05/30/2023   GLUCOSE 231 (H) 05/30/2023   BUN 11 05/30/2023   CREATININE 0.82 05/30/2023   CALCIUM 9.9 05/30/2023   PROT 7.0 05/30/2023   ALBUMIN 4.3 05/30/2023   AST 17 05/30/2023   ALT 21 05/30/2023   ALKPHOS 102 05/30/2023   BILITOT 1.0 05/30/2023   GFRNONAA >60 05/30/2023    Lab Results  Component Value Date   CEA1 1.5 10/20/2022   CEA 1.54 03/14/2023    Imaging:  CT CHEST ABDOMEN PELVIS W CONTRAST Result Date: 06/14/2023 CLINICAL DATA:  History of colon cancer, follow-up. * Tracking Code: BO * EXAM: CT CHEST, ABDOMEN, AND PELVIS WITH CONTRAST TECHNIQUE: Multidetector CT imaging of the chest, abdomen and pelvis was performed following the standard protocol during bolus administration of intravenous contrast. RADIATION DOSE REDUCTION: This  exam was performed according to the departmental dose-optimization program which includes automated exposure control, adjustment of the mA and/or kV according to patient size and/or use of iterative reconstruction technique. CONTRAST:  OMNIPAQUE IOHEXOL 300 MG/ML  SOLN COMPARISON:  CT Oct 31, 2022 FINDINGS: CT CHEST FINDINGS Cardiovascular: Normal caliber thoracic aorta. Gas in the pulmonary outflow tract reflects sequela of vascular access. Normal size heart. No significant pericardial effusion/thickening. Mediastinum/Nodes: Similar appearance of the multinodular thyroid previously evaluated by thyroid ultrasound November 29, 2022 with recommendation for annual sonographic follow-up on that study. Calcified mediastinal and right hilar lymph nodes. No pathologically enlarged mediastinal, hilar or axillary lymph nodes. The esophagus is grossly unremarkable. Lungs/Pleura: Stable partially calcified right upper lobe pulmonary nodules measuring up to 14 mm on image 57/4. No suspicious pulmonary nodules or masses. No pleural effusion. No pneumothorax. Musculoskeletal: No aggressive lytic or blastic lesion of bone. Multilevel degenerative changes spine. CT ABDOMEN PELVIS FINDINGS Hepatobiliary: Hepatomegaly. Diffuse hepatic steatosis with focal areas of fatty sparing along the gallbladder fossa and anterior margin of the liver. No suspicious hepatic lesion. Cholelithiasis without findings of acute cholecystitis. No biliary ductal dilation. Pancreas: No pancreatic ductal dilation or evidence of acute inflammation. Spleen: No splenomegaly or focal splenic lesion. Adrenals/Urinary Tract: Bilateral adrenal glands appear normal. No hydronephrosis. Kidneys demonstrate symmetric enhancement. Urinary bladder is unremarkable for degree of distension. Stomach/Bowel: Radiopaque enteric contrast material traverses distal loops of small bowel. Stomach  is unremarkable for degree of distension. No pathologic dilation of small or large  bowel. Interval partial sigmoidectomy with rectosigmoid anastomosis in the midline pelvis. No suspicious nodularity along the suture line or the sigmoid mesocolon. Vascular/Lymphatic: Normal caliber abdominal aorta. Smooth IVC contours. Mixing artifact in the IVC at the level of the renal veins. The portal, splenic and superior mesenteric veins are patent. 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 No pathologically enlarged abdominal or pelvic lymph nodes. Reproductive: Prostate is unremarkable. Other: No significant abdominopelvic free fluid. No discrete peritoneal or omental nodularity. Musculoskeletal: No aggressive lytic or blastic lesion of bone. Multilevel degenerative change of the spine. Degenerative change of the bilateral hips. IMPRESSION: 1. Interval partial sigmoidectomy with rectosigmoid anastomosis in the midline pelvis. No suspicious nodularity along the suture line or the sigmoid mesocolon. 2. No evidence of metastatic disease in the chest, abdomen or pelvis. 3. Hepatomegaly with diffuse hepatic steatosis. 4. Cholelithiasis without findings of acute cholecystitis. 5. 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 6. 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 Electronically Signed   By: Maudry Mayhew M.D.   On: 06/14/2023 16:41    Medications: I have reviewed the patient's current medications.   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 and areas spacious for invasion Well to moderately differentiated adenocarcinoma, tumor invades through muscularis propria, no lymphovascular  or perineural invasion, negative margins, no macroscopic tumor perforation, no tumor deposits, 1/14 nodes, no loss of mismatch repair protein expression Normal preoperative CEA CTs 10/31/2022-sigmoid wall thickening, no evidence of metastatic disease, calcified right hilar lymph nodes and 14 mm right upper lobe nodule with adjacent tiny nodules favored to reflect prior granulomatous disease Cycle 1 CAPOX 01/11/2023 Cycle 2 CAPOX 01/31/2023 Cycle 3 CAPOX 02/22/2023 Cycle 4 CAPOX 03/14/2023, oxaliplatin held due to neuropathy Cycle 1 capecitabine 04/23/2023 Cycle 2 capecitabine 05/14/2023 Cycle 3 capecitabine 06/04/2023 Cycle 4 capecitabine 06/25/2023 2.  Multiple colon polyps removed on colonoscopy 10/19/2022-tubular adenomas and tubulovillous adenomas 3.  Nodular left thyroid on CT 10/31/2022 Thyroid ultrasound 11/29/2022-multinodular goiter, 3 nodules meet criteria for 1 year follow-up imaging   4.  Diabetes 5.  Hypertension 6.  Admission with GI bleeding following the colonoscopy 10/19/2022 7.  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  He is tolerating the capecitabine well.  He will complete a final cycle of capecitabine beginning 06/25/2023.  He will use a moisturizer at the nailbeds.  We will follow-up on peripheral blood ct DNA testing from today.  He will return for an office and lab visit in 1 month.  Thornton Papas, MD  06/18/2023  9:58 AM

## 2023-06-18 NOTE — Progress Notes (Signed)
Specialty Pharmacy Refill Coordination Note  Brandon Ruiz is a 47 y.o. male contacted today regarding refills of specialty medication(s) Capecitabine (XELODA)   Patient requested Delivery   Delivery date: 06/21/23   Verified address: [address]   [place] [address]   Medication will be filled on 06/19/23.

## 2023-06-19 ENCOUNTER — Other Ambulatory Visit: Payer: Self-pay

## 2023-06-20 ENCOUNTER — Other Ambulatory Visit: Payer: Self-pay

## 2023-06-21 ENCOUNTER — Other Ambulatory Visit: Payer: Self-pay | Admitting: Internal Medicine

## 2023-06-21 ENCOUNTER — Other Ambulatory Visit: Payer: Self-pay

## 2023-06-21 DIAGNOSIS — E291 Testicular hypofunction: Secondary | ICD-10-CM

## 2023-06-21 MED ORDER — TESTOSTERONE CYPIONATE 100 MG/ML IM SOLN: 100.0000 mg | INTRAMUSCULAR | 0 refills | Status: DC

## 2023-06-22 ENCOUNTER — Other Ambulatory Visit: Payer: Self-pay | Admitting: Internal Medicine

## 2023-06-22 ENCOUNTER — Telehealth: Payer: Self-pay | Admitting: Internal Medicine

## 2023-06-22 DIAGNOSIS — E291 Testicular hypofunction: Secondary | ICD-10-CM

## 2023-06-22 DIAGNOSIS — G4733 Obstructive sleep apnea (adult) (pediatric): Secondary | ICD-10-CM | POA: Insufficient documentation

## 2023-06-22 MED ORDER — TESTOSTERONE CYPIONATE 100 MG/ML IM SOLN: 200.0000 mg | INTRAMUSCULAR | 0 refills | Status: DC

## 2023-06-22 MED ORDER — TESTOSTERONE CYPIONATE 200 MG/ML IM SOLN: 200.0000 mg | INTRAMUSCULAR | 0 refills | Status: DC

## 2023-06-22 NOTE — Telephone Encounter (Signed)
I have spoken with the pharmacy, I have spoken with D. Jones the patients medication has been corrected and sent to the pharmacy.

## 2023-06-22 NOTE — Telephone Encounter (Signed)
Copied from CRM [id number]. Topic: Clinical - Medication Question >> Jun 22, 2023  2:48 PM Alona Bene A wrote:  Reason for CRM: [facility] called in regarding testosterone cypionate (DEPO-TESTOSTERONE) 100 MG/ML injection. Please contact pharmacy at [phone]. Pharmacy is requesting immediate callback to discuss medication for patient.

## 2023-06-22 NOTE — Telephone Encounter (Signed)
Can you change the rx ? See mychart message I sent

## 2023-06-22 NOTE — Telephone Encounter (Signed)
Pt is wondering if this prescription can be changed from 100 mg to 200 so he can receive it from the pharmacy because insurance is denying it at 100 mg which is what was told to the pt.  PLEASE ADVISE, Thanks

## 2023-07-02 ENCOUNTER — Telehealth: Payer: Self-pay | Admitting: *Deleted

## 2023-07-02 NOTE — Telephone Encounter (Signed)
 Brandon Ruiz called to inquire about the Guardant Reveal test results. Per website he tested negative for ctDNA.

## 2023-07-04 ENCOUNTER — Ambulatory Visit: Payer: Managed Care, Other (non HMO) | Admitting: Internal Medicine

## 2023-07-05 ENCOUNTER — Other Ambulatory Visit: Payer: Self-pay

## 2023-07-10 ENCOUNTER — Other Ambulatory Visit: Payer: Self-pay

## 2023-07-10 ENCOUNTER — Other Ambulatory Visit (HOSPITAL_COMMUNITY): Payer: Self-pay

## 2023-07-12 ENCOUNTER — Other Ambulatory Visit: Payer: Managed Care, Other (non HMO) | Admitting: Oncology

## 2023-07-18 LAB — HM DIABETES EYE EXAM

## 2023-07-26 ENCOUNTER — Inpatient Hospital Stay (HOSPITAL_BASED_OUTPATIENT_CLINIC_OR_DEPARTMENT_OTHER): Payer: Managed Care, Other (non HMO) | Admitting: Oncology

## 2023-07-26 ENCOUNTER — Inpatient Hospital Stay: Payer: Managed Care, Other (non HMO) | Attending: Oncology

## 2023-07-26 VITALS — BP 146/92 | HR 100 | Temp 98.2°F | Resp 18 | Ht 76.0 in | Wt 257.8 lb

## 2023-07-26 DIAGNOSIS — C187 Malignant neoplasm of sigmoid colon: Secondary | ICD-10-CM

## 2023-07-26 DIAGNOSIS — Z85038 Personal history of other malignant neoplasm of large intestine: Secondary | ICD-10-CM | POA: Insufficient documentation

## 2023-07-26 DIAGNOSIS — Z9221 Personal history of antineoplastic chemotherapy: Secondary | ICD-10-CM | POA: Insufficient documentation

## 2023-07-26 LAB — CEA (ACCESS): CEA (CHCC): 1.44 ng/mL (ref 0.00–5.00)

## 2023-07-26 LAB — CBC WITH DIFFERENTIAL (CANCER CENTER ONLY)
Abs Immature Granulocytes: 0.02 10*3/uL (ref 0.00–0.07)
Basophils Absolute: 0.1 10*3/uL (ref 0.0–0.1)
Basophils Relative: 1 %
Eosinophils Absolute: 0.2 10*3/uL (ref 0.0–0.5)
Eosinophils Relative: 3 %
HCT: 49 % (ref 39.0–52.0)
Hemoglobin: 17.3 g/dL — ABNORMAL HIGH (ref 13.0–17.0)
Immature Granulocytes: 0 %
Lymphocytes Relative: 31 %
Lymphs Abs: 2.2 10*3/uL (ref 0.7–4.0)
MCH: 32.5 pg (ref 26.0–34.0)
MCHC: 35.3 g/dL (ref 30.0–36.0)
MCV: 92.1 fL (ref 80.0–100.0)
Monocytes Absolute: 0.7 10*3/uL (ref 0.1–1.0)
Monocytes Relative: 10 %
Neutro Abs: 3.9 10*3/uL (ref 1.7–7.7)
Neutrophils Relative %: 55 %
Platelet Count: 224 10*3/uL (ref 150–400)
RBC: 5.32 MIL/uL (ref 4.22–5.81)
RDW: 15.2 % (ref 11.5–15.5)
WBC Count: 7.1 10*3/uL (ref 4.0–10.5)
nRBC: 0 % (ref 0.0–0.2)

## 2023-07-26 LAB — CMP (CANCER CENTER ONLY)
ALT: 45 U/L — ABNORMAL HIGH (ref 0–44)
AST: 31 U/L (ref 15–41)
Albumin: 4.3 g/dL (ref 3.5–5.0)
Alkaline Phosphatase: 137 U/L — ABNORMAL HIGH (ref 38–126)
Anion gap: 8 (ref 5–15)
BUN: 14 mg/dL (ref 6–20)
CO2: 27 mmol/L (ref 22–32)
Calcium: 10.3 mg/dL (ref 8.9–10.3)
Chloride: 100 mmol/L (ref 98–111)
Creatinine: 0.82 mg/dL (ref 0.61–1.24)
GFR, Estimated: >60 mL/min (ref >60)
Glucose, Bld: 246 mg/dL — ABNORMAL HIGH (ref 70–99)
Potassium: 4.1 mmol/L (ref 3.5–5.1)
Sodium: 135 mmol/L (ref 135–145)
Total Bilirubin: 1 mg/dL (ref 0.0–1.2)
Total Protein: 7.7 g/dL (ref 6.5–8.1)

## 2023-07-26 NOTE — Progress Notes (Signed)
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  He completed a final cycle of capecitabine beginning 06/25/2023.  No mouth ulcers, nausea, hand/foot pain, or diarrhea.  He has noted a nodular area at the lower inner gumline. He had an upper respiratory infection several weeks ago.  This has resolved. Objective:  Vital signs in last 24 hours:  Blood pressure (!) 144/108, pulse 100, temperature 98.2 F (36.8 C), temperature source Temporal, resp. rate 18, height 6\' 4"  (1.93 m), weight 257 lb 12.8 oz (116.9 kg), SpO2 98%.    HEENT: No thrush or ulcers.  Slightly raised 3-4 mm white area at the left mid lower inner gumline, similar to a raised area on the right side. Lymphatics: No cervical, supraclavicular, or inguinal nodes.  Prominent bilateral axillary fat pads versus soft mobile 1-2 cm nodes Resp: Lungs clear bilaterally Cardio: Regular rate and rhythm GI: No hepatosplenomegaly, no mass, nontender Vascular: No leg edema  Skin: Palms without erythema   Lab Results:  Lab Results  Component Value Date   WBC 7.1 07/26/2023   HGB 17.3 (H) 07/26/2023   HCT 49.0 07/26/2023   MCV 92.1 07/26/2023   PLT 224 07/26/2023   NEUTROABS 3.9 07/26/2023    CMP  Lab Results  Component Value Date   NA 135 07/26/2023   K 4.1 07/26/2023   CL 100 07/26/2023   CO2 27 07/26/2023   GLUCOSE 246 (H) 07/26/2023   BUN 14 07/26/2023   CREATININE 0.82 07/26/2023   CALCIUM 10.3 07/26/2023   PROT 7.7 07/26/2023   ALBUMIN 4.3 07/26/2023   AST 31 07/26/2023   ALT 45 (H) 07/26/2023   ALKPHOS 137 (H) 07/26/2023   BILITOT 1.0 07/26/2023   GFRNONAA >60 07/26/2023    Lab Results  Component Value Date   CEA1 1.5 10/20/2022   CEA 1.54 03/14/2023     Medications: I have reviewed the patient's current medications.   Assessment/Plan: Sigmoid colon cancer, stage IIIb (pT3 pN1a), status post a robotic sigmoid  resection 6/17/202 Colonoscopy 10/19/2022, mass in the sigmoid colon at 30 cm, biopsy-superficial fragments of ulcerated tubulovillous adenoma with extensive high-grade dysplasia and areas spacious for invasion Well to moderately differentiated adenocarcinoma, tumor invades through muscularis propria, no lymphovascular or perineural invasion, negative margins, no macroscopic tumor perforation, no tumor deposits, 1/14 nodes, no loss of mismatch repair protein expression Normal preoperative CEA CTs 10/31/2022-sigmoid wall thickening, no evidence of metastatic disease, calcified right hilar lymph nodes and 14 mm right upper lobe nodule with adjacent tiny nodules favored to reflect prior granulomatous disease Cycle 1 CAPOX 01/11/2023 Cycle 2 CAPOX 01/31/2023 Cycle 3 CAPOX 02/22/2023 Cycle 4 CAPOX 03/14/2023, oxaliplatin held due to neuropathy Cycle 1 capecitabine 04/23/2023 Cycle 2 capecitabine 05/14/2023 Cycle 3 capecitabine 06/04/2023 06/18/2023: Guardant reveal-negative Cycle 4 capecitabine 06/25/2023 2.  Multiple colon polyps removed on colonoscopy 10/19/2022-tubular adenomas and tubulovillous adenomas 3.  Nodular left thyroid on CT 10/31/2022 Thyroid ultrasound 11/29/2022-multinodular goiter, 3 nodules meet criteria for 1 year follow-up imaging   4.  Diabetes 5.  Hypertension 6.  Admission with GI bleeding following the colonoscopy 10/19/2022 7.  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  He is in clinical remission from colon cancer.  He will return for an office visit and surveillance CTs in June.  He he is due for a surveillance colonoscopy this year. He will also be scheduled  for a follow-up thyroid ultrasound. Thornton Papas, MD  07/26/2023  9:23 AM

## 2023-07-27 ENCOUNTER — Other Ambulatory Visit: Payer: Self-pay

## 2023-07-28 ENCOUNTER — Other Ambulatory Visit: Payer: Self-pay | Admitting: Internal Medicine

## 2023-07-28 ENCOUNTER — Encounter: Payer: Self-pay | Admitting: Internal Medicine

## 2023-08-15 ENCOUNTER — Ambulatory Visit: Payer: Managed Care, Other (non HMO) | Admitting: Internal Medicine

## 2023-09-19 ENCOUNTER — Encounter: Payer: Self-pay | Admitting: Internal Medicine

## 2023-09-19 ENCOUNTER — Ambulatory Visit (INDEPENDENT_AMBULATORY_CARE_PROVIDER_SITE_OTHER): Payer: Managed Care, Other (non HMO) | Admitting: Internal Medicine

## 2023-09-19 VITALS — BP 130/86 | HR 90 | Temp 98.2°F | Ht 76.0 in | Wt 261.2 lb

## 2023-09-19 DIAGNOSIS — I10 Essential (primary) hypertension: Secondary | ICD-10-CM

## 2023-09-19 DIAGNOSIS — Z794 Long term (current) use of insulin: Secondary | ICD-10-CM

## 2023-09-19 DIAGNOSIS — E119 Type 2 diabetes mellitus without complications: Secondary | ICD-10-CM

## 2023-09-19 DIAGNOSIS — R000 Tachycardia, unspecified: Secondary | ICD-10-CM

## 2023-09-19 DIAGNOSIS — Z0001 Encounter for general adult medical examination with abnormal findings: Secondary | ICD-10-CM

## 2023-09-19 DIAGNOSIS — Z0000 Encounter for general adult medical examination without abnormal findings: Secondary | ICD-10-CM | POA: Diagnosis not present

## 2023-09-19 DIAGNOSIS — E1129 Type 2 diabetes mellitus with other diabetic kidney complication: Secondary | ICD-10-CM | POA: Diagnosis not present

## 2023-09-19 DIAGNOSIS — R809 Proteinuria, unspecified: Secondary | ICD-10-CM

## 2023-09-19 DIAGNOSIS — E785 Hyperlipidemia, unspecified: Secondary | ICD-10-CM | POA: Diagnosis not present

## 2023-09-19 DIAGNOSIS — E291 Testicular hypofunction: Secondary | ICD-10-CM

## 2023-09-19 DIAGNOSIS — R0683 Snoring: Secondary | ICD-10-CM

## 2023-09-19 LAB — CBC WITH DIFFERENTIAL/PLATELET
Basophils Absolute: 0.1 10*3/uL (ref 0.0–0.1)
Basophils Relative: 0.9 % (ref 0.0–3.0)
Eosinophils Absolute: 0.3 10*3/uL (ref 0.0–0.7)
Eosinophils Relative: 4 % (ref 0.0–5.0)
HCT: 51.5 % (ref 39.0–52.0)
Hemoglobin: 17.3 g/dL — ABNORMAL HIGH (ref 13.0–17.0)
Lymphocytes Relative: 25.3 % (ref 12.0–46.0)
Lymphs Abs: 1.6 10*3/uL (ref 0.7–4.0)
MCHC: 33.7 g/dL (ref 30.0–36.0)
MCV: 92.7 fl (ref 78.0–100.0)
Monocytes Absolute: 0.5 10*3/uL (ref 0.1–1.0)
Monocytes Relative: 7.9 % (ref 3.0–12.0)
Neutro Abs: 4 10*3/uL (ref 1.4–7.7)
Neutrophils Relative %: 61.9 % (ref 43.0–77.0)
Platelets: 246 10*3/uL (ref 150.0–400.0)
RBC: 5.56 Mil/uL (ref 4.22–5.81)
RDW: 14 % (ref 11.5–15.5)
WBC: 6.4 10*3/uL (ref 4.0–10.5)

## 2023-09-19 LAB — URINALYSIS, ROUTINE W REFLEX MICROSCOPIC
Bilirubin Urine: NEGATIVE
Hgb urine dipstick: NEGATIVE
Ketones, ur: NEGATIVE
Leukocytes,Ua: NEGATIVE
Nitrite: NEGATIVE
RBC / HPF: NONE SEEN (ref 0–?)
Specific Gravity, Urine: 1.01 (ref 1.000–1.030)
Total Protein, Urine: NEGATIVE
Urine Glucose: >=1000 — AB
Urobilinogen, UA: 0.2 (ref 0.0–1.0)
WBC, UA: NONE SEEN (ref 0–?)
pH: 7 (ref 5.0–8.0)

## 2023-09-19 LAB — MICROALBUMIN / CREATININE URINE RATIO
Creatinine,U: 46.3 mg/dL
Microalb Creat Ratio: 49.2 mg/g — ABNORMAL HIGH (ref 0.0–30.0)
Microalb, Ur: 2.3 mg/dL — ABNORMAL HIGH (ref 0.0–1.9)

## 2023-09-19 LAB — BASIC METABOLIC PANEL WITH GFR
BUN: 13 mg/dL (ref 6–23)
CO2: 27 meq/L (ref 19–32)
Calcium: 9.7 mg/dL (ref 8.4–10.5)
Chloride: 102 meq/L (ref 96–112)
Creatinine, Ser: 0.8 mg/dL (ref 0.40–1.50)
GFR: 105.37 mL/min (ref >60.00)
Glucose, Bld: 195 mg/dL — ABNORMAL HIGH (ref 70–99)
Potassium: 4.5 meq/L (ref 3.5–5.1)
Sodium: 138 meq/L (ref 135–145)

## 2023-09-19 LAB — HEMOGLOBIN A1C: Hgb A1c MFr Bld: 8.4 % — ABNORMAL HIGH (ref 4.6–6.5)

## 2023-09-19 LAB — LIPID PANEL
Cholesterol: 117 mg/dL (ref 0–200)
HDL: 24 mg/dL — ABNORMAL LOW (ref >39.00)
LDL Cholesterol: 28 mg/dL (ref 0–99)
NonHDL: 92.58
Total CHOL/HDL Ratio: 5
Triglycerides: 325 mg/dL — ABNORMAL HIGH (ref 0.0–149.0)
VLDL: 65 mg/dL — ABNORMAL HIGH (ref 0.0–40.0)

## 2023-09-19 NOTE — Progress Notes (Signed)
 Subjective:  Patient ID: Brandon Ruiz, male    DOB: 1976/02/12  Age: 48 y.o. MRN: [id number]  CC: Annual Exam, Hypertension, Diabetes, and Hyperlipidemia   HPI Brandon Ruiz presents for a CPX and f/up ----   Discussed the use of AI scribe software for clinical note transcription with the patient, who gave verbal consent to proceed.  History of Present Illness   Brandon Ruiz is a 48 year old male who presents for a follow-up visit.  He experiences soreness in his hips, which he attributes to arthritis. He remains physically active, walking a couple of times a week. No chest pain, shortness of breath, dizziness, or lightheadedness during these activities.  He has recently adjusted his medication regimen, doubling his dose of Olmesartan from 20 mg to 40 mg, resulting in improved blood pressure control. No side effects from this change. Additionally, he has increased his dose of rosuvastatin from 10 mg to 20 mg without experiencing muscle aches.  He monitors his blood sugar levels daily, with morning readings around 150 mg/dL. No symptoms such as excessive thirst or urination. He is on metformin and reports no gastrointestinal side effects such as diarrhea or constipation.  He mentions a history of snoring, which has resolved following weight loss. No symptoms of sleep apnea, such as being woken up by his girlfriend due to cessation of breathing.  He mentions completing chemotherapy in late January, which he believes may affect his blood sugar levels and A1c results.       Outpatient Medications Prior to Visit  Medication Sig Dispense Refill   acetaminophen (TYLENOL) 500 MG tablet Take 1,000 mg by mouth every 6 (six) hours as needed for moderate pain.     Continuous Glucose Receiver (DEXCOM G7 RECEIVER) DEVI 1 Act by Does not apply route daily. 9 each 1   Continuous Glucose Sensor (DEXCOM G7 SENSOR) MISC 1 Act by Does not apply route daily. 9 each 1   empagliflozin (JARDIANCE) 10 MG TABS tablet  Take 1 tablet (10 mg total) by mouth daily before breakfast. 90 tablet 0   Insulin Glargine (BASAGLAR KWIKPEN) 100 UNIT/ML INJECT 20 UNITS UNDER THE SKIN DAILY 15 mL 0   metFORMIN (GLUCOPHAGE) 1000 MG tablet Take 1 tablet (1,000 mg total) by mouth 2 (two) times daily with a meal. 180 tablet 0   metoprolol succinate (TOPROL-XL) 25 MG 24 hr tablet Take 1 tablet (25 mg total) by mouth daily. 90 tablet 0   olmesartan (BENICAR) 20 MG tablet Take 1 tablet (20 mg total) by mouth daily. 90 tablet 0   rosuvastatin (CRESTOR) 10 MG tablet Take 1 tablet (10 mg total) by mouth daily. 90 tablet 0   No facility-administered medications prior to visit.    ROS Review of Systems  Constitutional: Negative.  Negative for appetite change, chills, diaphoresis and fatigue.  HENT: Negative.    Eyes: Negative.   Respiratory: Negative.  Negative for cough, shortness of breath and wheezing.   Cardiovascular:  Negative for chest pain, palpitations and leg swelling.  Gastrointestinal: Negative.  Negative for abdominal pain, constipation, diarrhea, nausea and vomiting.  Endocrine: Negative.   Genitourinary: Negative.  Negative for difficulty urinating.  Musculoskeletal:  Positive for arthralgias. Negative for myalgias and neck pain.  Skin: Negative.   Neurological:  Negative for dizziness, weakness and headaches.  Hematological:  Negative for adenopathy. Does not bruise/bleed easily.  Psychiatric/Behavioral: Negative.      Objective:  BP 130/86 (BP Location: Left Arm, Patient Position: Sitting, Cuff Size:  Large)   Pulse 90   Temp 98.2 F (36.8 C) (Oral)   Ht 6\' 4"  (1.93 m)   Wt 261 lb 3.2 oz (118.5 kg)   SpO2 97%   BMI 31.79 kg/m   BP Readings from Last 3 Encounters:  09/19/23 130/86  07/26/23 (!) 146/92  06/18/23 (!) 142/94    Wt Readings from Last 3 Encounters:  09/19/23 261 lb 3.2 oz (118.5 kg)  07/26/23 257 lb 12.8 oz (116.9 kg)  06/18/23 260 lb 6.4 oz (118.1 kg)    Physical Exam Vitals  reviewed.  Constitutional:      Appearance: Normal appearance.  HENT:     Mouth/Throat:     Mouth: Mucous membranes are moist.  Eyes:     General: No scleral icterus.    Conjunctiva/sclera: Conjunctivae normal.  Cardiovascular:     Rate and Rhythm: Normal rate and regular rhythm.     Pulses: Normal pulses.     Heart sounds: Normal heart sounds, S1 normal and S2 normal. No murmur heard.    No friction rub. No gallop.     Comments: EKG- NSR, 86 bpm RBBB No LVH or Q waves Unchanged Pulmonary:     Effort: Pulmonary effort is normal.     Breath sounds: No stridor. No wheezing, rhonchi or rales.  Abdominal:     General: Abdomen is flat.     Palpations: There is no mass.     Tenderness: There is no abdominal tenderness. There is no guarding.     Hernia: No hernia is present.  Musculoskeletal:     Cervical back: Neck supple.     Right lower leg: No edema.     Left lower leg: No edema.  Skin:    General: Skin is warm and dry.  Neurological:     General: No focal deficit present.     Mental Status: He is alert. Mental status is at baseline.  Psychiatric:        Mood and Affect: Mood normal.        Behavior: Behavior normal.     Lab Results  Component Value Date   WBC 6.4 09/19/2023   HGB 17.3 (H) 09/19/2023   HCT 51.5 09/19/2023   PLT 246.0 09/19/2023   GLUCOSE 195 (H) 09/19/2023   CHOL 117 09/19/2023   TRIG 325.0 (H) 09/19/2023   HDL 24.00 (L) 09/19/2023   LDLDIRECT 85.0 08/03/2022   LDLCALC 28 09/19/2023   ALT 45 (H) 07/26/2023   AST 31 07/26/2023   NA 138 09/19/2023   K 4.5 09/19/2023   CL 102 09/19/2023   CREATININE 0.80 09/19/2023   BUN 13 09/19/2023   CO2 27 09/19/2023   TSH 1.21 09/19/2023   PSA 0.93 09/19/2023   INR 1.1 10/19/2022   HGBA1C 8.4 (H) 09/19/2023   MICROALBUR 2.3 (H) 09/19/2023    CT CHEST ABDOMEN PELVIS W CONTRAST Result Date: 06/14/2023 CLINICAL DATA:  History of colon cancer, follow-up. * Tracking Code: BO * EXAM: CT CHEST, ABDOMEN,  AND PELVIS WITH CONTRAST TECHNIQUE: Multidetector CT imaging of the chest, abdomen and pelvis was performed following the standard protocol during bolus administration of intravenous contrast. RADIATION DOSE REDUCTION: This exam was performed according to the departmental dose-optimization program which includes automated exposure control, adjustment of the mA and/or kV according to patient size and/or use of iterative reconstruction technique. CONTRAST:  OMNIPAQUE IOHEXOL 300 MG/ML  SOLN COMPARISON:  CT Oct 31, 2022 FINDINGS: CT CHEST FINDINGS Cardiovascular: Normal caliber thoracic  aorta. Gas in the pulmonary outflow tract reflects sequela of vascular access. Normal size heart. No significant pericardial effusion/thickening. Mediastinum/Nodes: Similar appearance of the multinodular thyroid previously evaluated by thyroid ultrasound November 29, 2022 with recommendation for annual sonographic follow-up on that study. Calcified mediastinal and right hilar lymph nodes. No pathologically enlarged mediastinal, hilar or axillary lymph nodes. The esophagus is grossly unremarkable. Lungs/Pleura: Stable partially calcified right upper lobe pulmonary nodules measuring up to 14 mm on image 57/4. No suspicious pulmonary nodules or masses. No pleural effusion. No pneumothorax. Musculoskeletal: No aggressive lytic or blastic lesion of bone. Multilevel degenerative changes spine. CT ABDOMEN PELVIS FINDINGS Hepatobiliary: Hepatomegaly. Diffuse hepatic steatosis with focal areas of fatty sparing along the gallbladder fossa and anterior margin of the liver. No suspicious hepatic lesion. Cholelithiasis without findings of acute cholecystitis. No biliary ductal dilation. Pancreas: No pancreatic ductal dilation or evidence of acute inflammation. Spleen: No splenomegaly or focal splenic lesion. Adrenals/Urinary Tract: Bilateral adrenal glands appear normal. No hydronephrosis. Kidneys demonstrate symmetric enhancement. Urinary bladder  is unremarkable for degree of distension. Stomach/Bowel: Radiopaque enteric contrast material traverses distal loops of small bowel. Stomach is unremarkable for degree of distension. No pathologic dilation of small or large bowel. Interval partial sigmoidectomy with rectosigmoid anastomosis in the midline pelvis. No suspicious nodularity along the suture line or the sigmoid mesocolon. Vascular/Lymphatic: Normal caliber abdominal aorta. Smooth IVC contours. Mixing artifact in the IVC at the level of the renal veins. The portal, splenic and superior mesenteric veins are patent. 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 No pathologically enlarged abdominal or pelvic lymph nodes. Reproductive: Prostate is unremarkable. Other: No significant abdominopelvic free fluid. No discrete peritoneal or omental nodularity. Musculoskeletal: No aggressive lytic or blastic lesion of bone. Multilevel degenerative change of the spine. Degenerative change of the bilateral hips. IMPRESSION: 1. Interval partial sigmoidectomy with rectosigmoid anastomosis in the midline pelvis. No suspicious nodularity along the suture line or the sigmoid mesocolon. 2. No evidence of metastatic disease in the chest, abdomen or pelvis. 3. Hepatomegaly with diffuse hepatic steatosis. 4. Cholelithiasis without findings of acute cholecystitis. 5. 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 6. 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 Electronically Signed   By: Maudry Mayhew M.D.   On: 06/14/2023 16:41    Assessment & Plan:   Insulin-requiring or dependent type II diabetes mellitus (HCC)- A1C is up to 8.4%. -     Microalbumin / creatinine urine ratio; Future -     Hemoglobin A1c; Future -     Basic metabolic panel with GFR; Future -     HM Diabetes Foot  Exam -     Basaglar KwikPen; Inject 24 Units into the skin daily.  Dispense: 24 mL; Refill: 1 -     Dexcom G7 Receiver; 1 Act by Does not apply route daily.  Dispense: 9 each; Refill: 1 -     Dexcom G7 Sensor; 1 Act by Does not apply route daily.  Dispense: 9 each; Refill: 1 -     metFORMIN HCl; Take 1 tablet (1,000 mg total) by mouth 2 (two) times daily with a meal.  Dispense: 180 tablet; Refill: 1 -     AMB Referral [facility] Care Management  Primary hypertension- BP is well controlled. EKG is negative for LVH. -  TSH; Future -     Urinalysis, Routine w reflex microscopic; Future -     CBC with Differential/Platelet; Future -     Basic metabolic panel with GFR; Future -     EKG 12-Lead -     Metoprolol Succinate ER; Take 1 tablet (25 mg total) by mouth daily.  Dispense: 90 tablet; Refill: 1 -     AMB Referral [facility]BCI Care Management -     Olmesartan Medoxomil; Take 1 tablet (40 mg total) by mouth daily.  Dispense: 90 tablet; Refill: 1  Dyslipidemia, goal LDL below 100- LDL goal achieved. Doing well on the statin  -     Lipid panel; Future -     TSH; Future -     AMB Referral [facility]BCI Care Management -     Rosuvastatin Calcium; Take 1 tablet (20 mg total) by mouth daily.  Dispense: 90 tablet; Refill: 1  Microalbuminuria due to type 2 diabetes mellitus (HCC) -     Microalbumin / creatinine urine ratio; Future -     Empagliflozin; Take 1 tablet (10 mg total) by mouth daily before breakfast.  Dispense: 90 tablet; Refill: 0 -     Olmesartan Medoxomil; Take 1 tablet (40 mg total) by mouth daily.  Dispense: 90 tablet; Refill: 1  Encounter for general adult medical examination with abnormal findings- Exam completed, labs reviewed, vaccines reviewed, cancer screenings addressed, pt ed material was given.  -     PSA; Future -     Urinalysis, Routine w reflex microscopic; Future  Hypogonadism, male -     Testosterone Total,Free,Bio, Males; Future  Loud snoring -     Ambulatory referral to Sleep  Studies  Tachycardia -     Metoprolol Succinate ER; Take 1 tablet (25 mg total) by mouth daily.  Dispense: 90 tablet; Refill: 1  Other orders -     EKG     Follow-up: Return in about 6 months (around 03/21/2024).  Sanda Linger, MD

## 2023-09-19 NOTE — Patient Instructions (Signed)
 Health Maintenance, Male  Adopting a healthy lifestyle and getting preventive care are important in promoting health and wellness. Ask your health care provider about:  The right schedule for you to have regular tests and exams.  Things you can do on your own to prevent diseases and keep yourself healthy.  What should I know about diet, weight, and exercise?  Eat a healthy diet    Eat a diet that includes plenty of vegetables, fruits, low-fat dairy products, and lean protein.  Do not eat a lot of foods that are high in solid fats, added sugars, or sodium.  Maintain a healthy weight  Body mass index (BMI) is a measurement that can be used to identify possible weight problems. It estimates body fat based on height and weight. Your health care provider can help determine your BMI and help you achieve or maintain a healthy weight.  Get regular exercise  Get regular exercise. This is one of the most important things you can do for your health. Most adults should:  Exercise for at least 150 minutes each week. The exercise should increase your heart rate and make you sweat (moderate-intensity exercise).  Do strengthening exercises at least twice a week. This is in addition to the moderate-intensity exercise.  Spend less time sitting. Even light physical activity can be beneficial.  Watch cholesterol and blood lipids  Have your blood tested for lipids and cholesterol at 48 years of age, then have this test every 5 years.  You may need to have your cholesterol levels checked more often if:  Your lipid or cholesterol levels are high.  You are older than 48 years of age.  You are at high risk for heart disease.  What should I know about cancer screening?  Many types of cancers can be detected early and may often be prevented. Depending on your health history and family history, you may need to have cancer screening at various ages. This may include screening for:  Colorectal cancer.  Prostate cancer.  Skin cancer.  Lung  cancer.  What should I know about heart disease, diabetes, and high blood pressure?  Blood pressure and heart disease  High blood pressure causes heart disease and increases the risk of stroke. This is more likely to develop in people who have high blood pressure readings or are overweight.  Talk with your health care provider about your target blood pressure readings.  Have your blood pressure checked:  Every 3-5 years if you are 9-95 years of age.  Every year if you are 85 years old or older.  If you are between the ages of 29 and 29 and are a current or former smoker, ask your health care provider if you should have a one-time screening for abdominal aortic aneurysm (AAA).  Diabetes  Have regular diabetes screenings. This checks your fasting blood sugar level. Have the screening done:  Once every three years after age 23 if you are at a normal weight and have a low risk for diabetes.  More often and at a younger age if you are overweight or have a high risk for diabetes.  What should I know about preventing infection?  Hepatitis B  If you have a higher risk for hepatitis B, you should be screened for this virus. Talk with your health care provider to find out if you are at risk for hepatitis B infection.  Hepatitis C  Blood testing is recommended for:  Everyone born from [birth year] through 1965.  Anyone  with known risk factors for hepatitis C.  Sexually transmitted infections (STIs)  You should be screened each year for STIs, including gonorrhea and chlamydia, if:  You are sexually active and are younger than 48 years of age.  You are older than 48 years of age and your health care provider tells you that you are at risk for this type of infection.  Your sexual activity has changed since you were last screened, and you are at increased risk for chlamydia or gonorrhea. Ask your health care provider if you are at risk.  Ask your health care provider about whether you are at high risk for HIV. Your health care provider  may recommend a prescription medicine to help prevent HIV infection. If you choose to take medicine to prevent HIV, you should first get tested for HIV. You should then be tested every 3 months for as long as you are taking the medicine.  Follow these instructions at home:  Alcohol use  Do not drink alcohol if your health care provider tells you not to drink.  If you drink alcohol:  Limit how much you have to 0-2 drinks a day.  Know how much alcohol is in your drink. In the [place] one drink equals one 12 oz bottle of beer (355 mL), one 5 oz glass of wine (148 mL), or one 1 oz glass of hard liquor (44 mL).  Lifestyle  Do not use any products that contain nicotine or tobacco. These products include cigarettes, chewing tobacco, and vaping devices, such as e-cigarettes. If you need help quitting, ask your health care provider.  Do not use street drugs.  Do not share needles.  Ask your health care provider for help if you need support or information about quitting drugs.  General instructions  Schedule regular health, dental, and eye exams.  Stay current with your vaccines.  Tell your health care provider if:  You often feel depressed.  You have ever been abused or do not feel safe at home.  Summary  Adopting a healthy lifestyle and getting preventive care are important in promoting health and wellness.  Follow your health care provider's instructions about healthy diet, exercising, and getting tested or screened for diseases.  Follow your health care provider's instructions on monitoring your cholesterol and blood pressure.  This information is not intended to replace advice given to you by your health care provider. Make sure you discuss any questions you have with your health care provider.  Document Revised: 11/01/2020 Document Reviewed: 11/01/2020  Elsevier Patient Education  2024 [employer].

## 2023-09-20 ENCOUNTER — Encounter: Payer: Self-pay | Admitting: Oncology

## 2023-09-20 LAB — PSA: PSA: 0.93 ng/mL (ref 0.10–4.00)

## 2023-09-20 LAB — TESTOSTERONE TOTAL,FREE,BIO, MALES
Albumin: 4.5 g/dL (ref 3.6–5.1)
Sex Hormone Binding: 11 nmol/L (ref 10–50)
Testosterone: 132 ng/dL — ABNORMAL LOW (ref 250–827)

## 2023-09-20 LAB — TSH: TSH: 1.21 u[IU]/mL (ref 0.35–5.50)

## 2023-09-21 ENCOUNTER — Encounter: Payer: Self-pay | Admitting: Internal Medicine

## 2023-09-21 MED ORDER — EMPAGLIFLOZIN 10 MG PO TABS: 10.0000 mg | ORAL_TABLET | Freq: Every day | ORAL | 0 refills | Status: AC

## 2023-09-21 MED ORDER — ROSUVASTATIN CALCIUM 10 MG PO TABS: 10.0000 mg | ORAL_TABLET | Freq: Every day | ORAL | 1 refills | Status: DC

## 2023-09-21 MED ORDER — BASAGLAR KWIKPEN 100 UNIT/ML ~~LOC~~ SOPN: 24.0000 [IU] | PEN_INJECTOR | Freq: Every day | SUBCUTANEOUS | 1 refills | Status: AC

## 2023-09-21 MED ORDER — OLMESARTAN MEDOXOMIL 20 MG PO TABS: 20.0000 mg | ORAL_TABLET | Freq: Every day | ORAL | 1 refills | Status: DC

## 2023-09-21 MED ORDER — METFORMIN HCL 1000 MG PO TABS
1000.0000 mg | ORAL_TABLET | Freq: Two times a day (BID) | ORAL | 1 refills | Status: AC
Start: 2023-09-21 — End: ?

## 2023-09-21 MED ORDER — DEXCOM G7 SENSOR MISC: 1.0000 | Freq: Every day | 1 refills | Status: AC

## 2023-09-21 MED ORDER — DEXCOM G7 RECEIVER DEVI: 1.0000 | Freq: Every day | 1 refills | Status: AC

## 2023-09-21 MED ORDER — METOPROLOL SUCCINATE ER 25 MG PO TB24: 25.0000 mg | ORAL_TABLET | Freq: Every day | ORAL | 1 refills | Status: AC

## 2023-09-24 MED ORDER — ROSUVASTATIN CALCIUM 20 MG PO TABS: 20.0000 mg | ORAL_TABLET | Freq: Every day | ORAL | 1 refills | Status: AC

## 2023-09-24 MED ORDER — OLMESARTAN MEDOXOMIL 40 MG PO TABS: 40.0000 mg | ORAL_TABLET | Freq: Every day | ORAL | 1 refills | Status: AC

## 2023-09-27 ENCOUNTER — Telehealth: Payer: Self-pay | Admitting: *Deleted

## 2023-09-27 NOTE — Progress Notes (Signed)
 Care Guide Pharmacy Note  09/27/2023 Name: Brandon Ruiz MRN: [id number] DOB: 23-Jul-1975  Referred By: Etta Grandchild, MD Reason for referral: Care Coordination (Outreach to schedule referral with pharmacist )   Brandon Ruiz is a 48 y.o. year old male who is a primary care patient of Etta Grandchild, MD.  [name] was referred to the pharmacist for assistance related to: DMII  An unsuccessful telephone outreach was attempted today to contact the patient who was referred to the pharmacy team for assistance with medication management. Additional attempts will be made to contact the patient.  [name], CMA [place]  [facility], [facility] Guide Direct Dial: [phone]  Fax: [phone] Website: [email or web address]

## 2023-09-28 NOTE — Progress Notes (Signed)
 Care Guide Pharmacy Note  09/28/2023 Name: [name] MRN: [id number] DOB: 1976/02/25  Referred By: Etta Grandchild, MD Reason for referral: Care Coordination (Outreach to schedule referral with pharmacist )   Brandon Ruiz is a 49 y.o. year old male who is a primary care patient of Etta Grandchild, MD.  [name] was referred to the pharmacist for assistance related to: DMII  A second unsuccessful telephone outreach was attempted today to contact the patient who was referred to the pharmacy team for assistance with medication management. Additional attempts will be made to contact the patient.  [name], CMA, [place]  [facility], [facility] Guide Direct Dial: [phone]  Fax: [phone] Website: [email or web address]

## 2023-10-01 NOTE — Progress Notes (Signed)
 Care Guide Pharmacy Note  10/01/2023 Name: Brandon Ruiz MRN: [id number] DOB: 05/22/1976  Referred By: Etta Grandchild, MD Reason for referral: Care Coordination (Outreach to schedule referral with pharmacist )   Brandon Ruiz is a 48 y.o. year old male who is a primary care patient of Etta Grandchild, MD.  Brandon Ruiz was referred to the pharmacist for assistance related to: DMII  A third unsuccessful telephone outreach was attempted today to contact the patient who was referred to the pharmacy team for assistance with medication management. The Population Health team is pleased to engage with this patient at any time in the future upon receipt of referral and should he/she be interested in assistance from the Population Health team.  Brandon Ruiz, CMA [facility]  [facility], [facility] Guide Direct Dial: [phone]  Fax: [phone] Website: [email or web address]

## 2023-10-02 ENCOUNTER — Encounter: Payer: Self-pay | Admitting: Internal Medicine

## 2023-10-02 ENCOUNTER — Other Ambulatory Visit (HOSPITAL_COMMUNITY): Payer: Self-pay

## 2023-10-05 ENCOUNTER — Other Ambulatory Visit: Payer: Self-pay

## 2023-10-05 ENCOUNTER — Other Ambulatory Visit (HOSPITAL_COMMUNITY): Payer: Self-pay

## 2023-10-05 ENCOUNTER — Telehealth: Payer: Self-pay

## 2023-10-05 NOTE — Telephone Encounter (Signed)
/  Pharmacy Patient Advocate Encounter  Received notification from [employer] that Prior Authorization for [facility] G7 Receiver device has been APPROVED from 10/05/23 to 10/04/24   PA #/Case ID/Reference #: [id number]  *per test claim, prior auth was approved but patient is only able to get 1 receiver one a year so he is not eligible again until 04/13/24

## 2023-10-05 NOTE — Telephone Encounter (Signed)
 Pharmacy Patient Advocate Encounter   Received notification from CoverMyMeds that prior authorization for [facility] G7 Receiver device is required/requested.   Insurance verification completed.   The patient is insured through [employer] .   Per test claim: PA required; PA submitted to above mentioned insurance via CoverMyMeds Key/confirmation #/EOC [id number] Status is pending

## 2023-10-08 NOTE — Telephone Encounter (Signed)
 Patient has been made aware.

## 2023-10-16 ENCOUNTER — Other Ambulatory Visit: Payer: Self-pay | Admitting: *Deleted

## 2023-10-16 ENCOUNTER — Encounter: Payer: Self-pay | Admitting: *Deleted

## 2023-10-16 DIAGNOSIS — C187 Malignant neoplasm of sigmoid colon: Secondary | ICD-10-CM

## 2023-10-16 NOTE — Progress Notes (Signed)
 Brandon Ruiz left message asking about date of his CT, thyroid  US  and colonoscopy that is due this year. Sent him Mychart with phone number to call central scheduling for the radiology orders. Entered referral to [facility] (Dr. Elvin Hammer) for his 1 year f/u colonoscopy.

## 2023-10-23 ENCOUNTER — Encounter: Payer: Self-pay | Admitting: *Deleted

## 2023-10-29 ENCOUNTER — Encounter: Payer: Self-pay | Admitting: Internal Medicine

## 2023-10-30 ENCOUNTER — Other Ambulatory Visit: Payer: Self-pay

## 2023-11-27 ENCOUNTER — Other Ambulatory Visit: Payer: Self-pay | Admitting: Internal Medicine

## 2023-11-27 ENCOUNTER — Telehealth: Payer: Self-pay | Admitting: Internal Medicine

## 2023-11-27 ENCOUNTER — Ambulatory Visit (AMBULATORY_SURGERY_CENTER)

## 2023-11-27 VITALS — Ht 76.0 in | Wt 254.0 lb

## 2023-11-27 DIAGNOSIS — E041 Nontoxic single thyroid nodule: Secondary | ICD-10-CM

## 2023-11-27 DIAGNOSIS — Z85038 Personal history of other malignant neoplasm of large intestine: Secondary | ICD-10-CM

## 2023-11-27 DIAGNOSIS — Z860100 Personal history of colon polyps, unspecified: Secondary | ICD-10-CM

## 2023-11-27 MED ORDER — NA SULFATE-K SULFATE-MG SULF 17.5-3.13-1.6 GM/177ML PO SOLN: 1.0000 | Freq: Once | ORAL | 0 refills | Status: AC

## 2023-11-27 NOTE — Telephone Encounter (Signed)
-----   Message from Sandra Crouch sent at 11/29/2022  4:11 PM EDT ----- Regarding: goiter recheck

## 2023-11-27 NOTE — Patient Instructions (Signed)
 ORAL DIABETIC MEDICATION INSTRUCTIONS Metformin  Glipizide Jardiance  Glimepiride Farixga Januvia  Rybelsus, Xigduo, Sitagliptin, Synjardy Tradjenta Actos, Alogliptin Invokana  The day before your procedure: 6/17  Do not take your diabetic pill   The day of your procedure: 6/18  Do not take your diabetic pill   We will check your blood sugar levels during the admission process and again in Recovery before discharging you home  _____________________________________________________________________________  INSULIN  (LONG ACTING) MEDICATION INSTRUCTIONS Lantus , Levemir, Basaglar , NPH, 70/30, Humulin, Novolin-N, Toujeo ,Tresiba, Xultophy, Soliqua  The day before your procedure: 6/17  Take  your regular daily dose   The day of your procedure: 6/18  Do not take your morning dose We will check your blood sugar levels during the admission process and again in Recovery before discharging you home

## 2023-11-27 NOTE — Progress Notes (Signed)

## 2023-11-28 ENCOUNTER — Encounter: Payer: Self-pay | Admitting: Internal Medicine

## 2023-11-29 ENCOUNTER — Ambulatory Visit (HOSPITAL_BASED_OUTPATIENT_CLINIC_OR_DEPARTMENT_OTHER)
Admission: RE | Admit: 2023-11-29 | Discharge: 2023-11-29 | Discharge status: Home / Self Care | Source: Ambulatory Visit | Attending: Oncology | Admitting: Oncology

## 2023-11-29 ENCOUNTER — Ambulatory Visit (HOSPITAL_BASED_OUTPATIENT_CLINIC_OR_DEPARTMENT_OTHER)
Admission: RE | Admit: 2023-11-29 | Discharge: 2023-11-29 | Disposition: A | Discharge status: Home / Self Care | Source: Ambulatory Visit | Attending: Oncology | Admitting: Oncology

## 2023-11-29 ENCOUNTER — Inpatient Hospital Stay: Attending: Oncology

## 2023-11-29 DIAGNOSIS — Z85038 Personal history of other malignant neoplasm of large intestine: Secondary | ICD-10-CM | POA: Diagnosis present

## 2023-11-29 DIAGNOSIS — C187 Malignant neoplasm of sigmoid colon: Secondary | ICD-10-CM

## 2023-11-29 DIAGNOSIS — I722 Aneurysm of renal artery: Secondary | ICD-10-CM | POA: Insufficient documentation

## 2023-11-29 DIAGNOSIS — I10 Essential (primary) hypertension: Secondary | ICD-10-CM | POA: Diagnosis not present

## 2023-11-29 LAB — BASIC METABOLIC PANEL - CANCER CENTER ONLY
Anion gap: 13 (ref 5–15)
BUN: 15 mg/dL (ref 6–20)
CO2: 24 mmol/L (ref 22–32)
Calcium: 10.1 mg/dL (ref 8.9–10.3)
Chloride: 101 mmol/L (ref 98–111)
Creatinine: 0.75 mg/dL (ref 0.61–1.24)
GFR, Estimated: >60 mL/min (ref >60)
Glucose, Bld: 124 mg/dL — ABNORMAL HIGH (ref 70–99)
Potassium: 4 mmol/L (ref 3.5–5.1)
Sodium: 138 mmol/L (ref 135–145)

## 2023-11-29 LAB — CEA (ACCESS): CEA (CHCC): <1 ng/mL (ref 0.00–5.00)

## 2023-11-29 LAB — POCT I-STAT CREATININE: Creatinine, Ser: 0.8 mg/dL (ref 0.61–1.24)

## 2023-11-29 MED ORDER — IOHEXOL 300 MG/ML  SOLN
100.0000 mL | Freq: Once | INTRAMUSCULAR | Status: AC | PRN
  Administered 2023-11-29: 100 mL via INTRAVENOUS

## 2023-12-02 ENCOUNTER — Ambulatory Visit: Payer: Self-pay | Admitting: Oncology

## 2023-12-03 ENCOUNTER — Other Ambulatory Visit: Payer: Self-pay

## 2023-12-03 DIAGNOSIS — E041 Nontoxic single thyroid nodule: Secondary | ICD-10-CM

## 2023-12-05 ENCOUNTER — Encounter: Payer: Self-pay | Admitting: Oncology

## 2023-12-05 ENCOUNTER — Inpatient Hospital Stay (HOSPITAL_BASED_OUTPATIENT_CLINIC_OR_DEPARTMENT_OTHER): Payer: Managed Care, Other (non HMO) | Admitting: Oncology

## 2023-12-05 VITALS — BP 142/100 | HR 92 | Temp 98.8°F | Resp 18 | Ht 76.0 in | Wt 255.5 lb

## 2023-12-05 DIAGNOSIS — Z85038 Personal history of other malignant neoplasm of large intestine: Secondary | ICD-10-CM | POA: Diagnosis not present

## 2023-12-05 DIAGNOSIS — C187 Malignant neoplasm of sigmoid colon: Secondary | ICD-10-CM | POA: Diagnosis not present

## 2023-12-05 NOTE — Progress Notes (Signed)
 [facility] OFFICE PROGRESS NOTE   Diagnosis: Colon cancer  INTERVAL HISTORY:   Mr. Brandon Ruiz returns as scheduled.  He feels well.  No difficulty with bowel function.  No bleeding.  He has a cold sensation in the right fourth finger.  No other neuropathy symptoms.  He is scheduled for a colonoscopy next week.  Objective:  Vital signs in last 24 hours:  Blood pressure (!) 142/100, pulse 92, temperature 98.8 F (37.1 C), temperature source Temporal, resp. rate 18, height 6' 4 (1.93 m), weight 255 lb 8 oz (115.9 kg), SpO2 96%.    Lymphatics: No cervical, supraclavicular, axillary, or inguinal nodes Resp: Lungs clear bilaterally Cardio: Regular rate and rhythm GI: No hepatosplenomegaly, nontender, no mass Vascular: No leg edema  Lab Results:  Lab Results  Component Value Date   WBC 6.4 09/19/2023   HGB 17.3 (H) 09/19/2023   HCT 51.5 09/19/2023   MCV 92.7 09/19/2023   PLT 246.0 09/19/2023   NEUTROABS 4.0 09/19/2023    CMP  Lab Results  Component Value Date   NA 138 11/29/2023   K 4.0 11/29/2023   CL 101 11/29/2023   CO2 24 11/29/2023   GLUCOSE 124 (H) 11/29/2023   BUN 15 11/29/2023   CREATININE 0.75 11/29/2023   CALCIUM  10.1 11/29/2023   PROT 7.7 07/26/2023   ALBUMIN 4.3 07/26/2023   AST 31 07/26/2023   ALT 45 (H) 07/26/2023   ALKPHOS 137 (H) 07/26/2023   BILITOT 1.0 07/26/2023   GFRNONAA >60 11/29/2023    Lab Results  Component Value Date   CEA1 1.5 10/20/2022   CEA <1.00 11/29/2023     Medications: I have reviewed the patient's current medications.   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 and areas spacious for invasion Well to moderately differentiated adenocarcinoma, tumor invades through muscularis propria,  no lymphovascular or perineural invasion, negative margins, no macroscopic tumor perforation, no tumor deposits, 1/14 nodes, no loss of mismatch repair protein expression Normal preoperative CEA CTs 10/31/2022-sigmoid wall thickening, no evidence of metastatic disease, calcified right hilar lymph nodes and 14 mm right upper lobe nodule with adjacent tiny nodules favored to reflect prior granulomatous disease Cycle 1 CAPOX 01/11/2023 Cycle 2 CAPOX 01/31/2023 Cycle 3 CAPOX 02/22/2023 Cycle 4 CAPOX 03/14/2023, oxaliplatin  held due to neuropathy Cycle 1 capecitabine  04/23/2023 Cycle 2 capecitabine  05/14/2023 Cycle 3 capecitabine  06/04/2023 06/18/2023: Guardant reveal-negative Cycle 4 capecitabine  06/25/2023 CT 11/29/2023: No evidence of recurrent disease, 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  Multiple colon polyps removed on colonoscopy 10/19/2022-tubular adenomas and tubulovillous adenomas 3.  Nodular left thyroid  on CT 10/31/2022 Thyroid  ultrasound 11/29/2022-multinodular goiter, 3 nodules meet criteria for 1 year follow-up imaging Thyroid  ultrasound 11/29/2023-no change in size or appearance of TI-RADS category 3 nodules in the left thyroid , 1 year follow-up ultrasound recommended   4.  Diabetes 5.  Hypertension 6.  Admission with GI bleeding following the colonoscopy 10/19/2022 7.  Hyperlipidemia      Disposition: Brandon Ruiz is in clinical remission from colon cancer.  He will return for an office visit and CEA in 6 months.  He will be scheduled for valence CTs and a follow-up thyroid  ultrasound in 1 year. I recommended he follow-up with Dr. Rochelle Chu for management of hypertension.  I will contact Dr.  Jones regarding the mesenteric and renal artery aneurysms to see if there is an indication for a vascular surgery evaluation.  Coni Deep, MD  12/05/2023  9:06 AM

## 2023-12-07 ENCOUNTER — Other Ambulatory Visit: Payer: Self-pay

## 2023-12-09 ENCOUNTER — Other Ambulatory Visit: Payer: Self-pay

## 2023-12-10 ENCOUNTER — Other Ambulatory Visit: Payer: Self-pay | Admitting: *Deleted

## 2023-12-10 DIAGNOSIS — E041 Nontoxic single thyroid nodule: Secondary | ICD-10-CM

## 2023-12-10 NOTE — Progress Notes (Signed)
 Per Dr. Scherrie Curt: Needs repeat thyroid  US  in 1 year to f/u nodules noted 11/2023. Order placed.

## 2023-12-12 ENCOUNTER — Encounter: Payer: Self-pay | Admitting: Internal Medicine

## 2023-12-12 ENCOUNTER — Ambulatory Visit: Admitting: Internal Medicine

## 2023-12-12 VITALS — BP 123/82 | HR 81 | Temp 98.1°F | Resp 16 | Ht 76.0 in | Wt 254.0 lb

## 2023-12-12 DIAGNOSIS — K635 Polyp of colon: Secondary | ICD-10-CM

## 2023-12-12 DIAGNOSIS — Z1211 Encounter for screening for malignant neoplasm of colon: Secondary | ICD-10-CM

## 2023-12-12 DIAGNOSIS — D122 Benign neoplasm of ascending colon: Secondary | ICD-10-CM

## 2023-12-12 DIAGNOSIS — K648 Other hemorrhoids: Secondary | ICD-10-CM | POA: Diagnosis not present

## 2023-12-12 DIAGNOSIS — K6389 Other specified diseases of intestine: Secondary | ICD-10-CM

## 2023-12-12 DIAGNOSIS — K5730 Diverticulosis of large intestine without perforation or abscess without bleeding: Secondary | ICD-10-CM | POA: Diagnosis not present

## 2023-12-12 DIAGNOSIS — Z860100 Personal history of colon polyps, unspecified: Secondary | ICD-10-CM

## 2023-12-12 DIAGNOSIS — Z85038 Personal history of other malignant neoplasm of large intestine: Secondary | ICD-10-CM

## 2023-12-12 MED ORDER — SODIUM CHLORIDE 0.9 % IV SOLN: 500.0000 mL | Freq: Once | INTRAVENOUS | Status: DC

## 2023-12-12 NOTE — Progress Notes (Signed)
 Called to room to assist during endoscopic procedure.  Patient ID and intended procedure confirmed with present staff. Received instructions for my participation in the procedure from the performing physician.

## 2023-12-12 NOTE — Patient Instructions (Signed)
 Handout on hemorrhoids, polyps, and diverticulosis given to patient Await pathology results Repeat colonoscopy in 1 year for surveillance Resume previous diet and continue present medications    YOU HAD AN ENDOSCOPIC PROCEDURE TODAY AT THE [facility]:   Refer to the procedure report that was given to you for any specific questions about what was found during the examination.  If the procedure report does not answer your questions, please call your gastroenterologist to clarify.  If you requested that your care partner not be given the details of your procedure findings, then the procedure report has been included in a sealed envelope for you to review at your convenience later.  YOU SHOULD EXPECT: Some feelings of bloating in the abdomen. Passage of more gas than usual.  Walking can help get rid of the air that was put into your GI tract during the procedure and reduce the bloating. If you had a lower endoscopy (such as a colonoscopy or flexible sigmoidoscopy) you may notice spotting of blood in your stool or on the toilet paper. If you underwent a bowel prep for your procedure, you may not have a normal bowel movement for a few days.  Please Note:  You might notice some irritation and congestion in your nose or some drainage.  This is from the oxygen used during your procedure.  There is no need for concern and it should clear up in a day or so.  SYMPTOMS TO REPORT IMMEDIATELY:  Following lower endoscopy (colonoscopy or flexible sigmoidoscopy):  Excessive amounts of blood in the stool  Significant tenderness or worsening of abdominal pains  Swelling of the abdomen that is new, acute  Fever of 100F or higher  For urgent or emergent issues, a gastroenterologist can be reached at any hour by calling [phone]. Do not use MyChart messaging for urgent concerns.    DIET:  We do recommend a small meal at first, but then you may proceed to your regular diet.  Drink plenty of  fluids but you should avoid alcoholic beverages for 24 hours.  ACTIVITY:  You should plan to take it easy for the rest of today and you should NOT DRIVE or use heavy machinery until tomorrow (because of the sedation medicines used during the test).    FOLLOW UP: Our staff will call the number listed on your records the next business day following your procedure.  We will call around 7:15- 8:00 am to check on you and address any questions or concerns that you may have regarding the information given to you following your procedure. If we do not reach you, we will leave a message.     If any biopsies were taken you will be contacted by phone or by letter within the next 1-3 weeks.  Please call us  at [phone] if you have not heard about the biopsies in 3 weeks.    SIGNATURES/CONFIDENTIALITY: You and/or your care partner have signed paperwork which will be entered into your electronic medical record.  These signatures attest to the fact that that the information above on your After Visit Summary has been reviewed and is understood.  Full responsibility of the confidentiality of this discharge information lies with you and/or your care-partner.

## 2023-12-12 NOTE — Progress Notes (Signed)
 Pt's states no medical or surgical changes since previsit or office visit.

## 2023-12-12 NOTE — Op Note (Signed)
 [facility] Patient Name: Brandon Ruiz Procedure Date: 12/12/2023 2:37 PM MRN: [id number] Endoscopist: Murel Arlington. Elvin Hammer , MD, [id number] Age: 48 Referring MD:  Date of Birth: [birth year] Gender: Male Account #: [id number] Procedure:                Colonoscopy with cold snare polypectomy x 2 Indications:              High risk colon cancer surveillance: Personal                            history of colon cancer. Sigmoid colon cancer                            diagnosed April 2024. Status post resection in June                            2024. Has completed adjuvant chemotherapy. Now for                            surveillance Medicines:                Monitored Anesthesia Care Procedure:                Pre-Anesthesia Assessment:                           - Prior to the procedure, a History and Physical                            was performed, and patient medications and                            allergies were reviewed. The patient's tolerance of                            previous anesthesia was also reviewed. The risks                            and benefits of the procedure and the sedation                            options and risks were discussed with the patient.                            All questions were answered, and informed consent                            was obtained. Prior Anticoagulants: The patient has                            taken no anticoagulant or antiplatelet agents. ASA                            Grade Assessment: II - A patient with mild systemic  disease. After reviewing the risks and benefits,                            the patient was deemed in satisfactory condition to                            undergo the procedure.                           After obtaining informed consent, the colonoscope                            was passed under direct vision. Throughout the                            procedure, the patient's  blood pressure, pulse, and                            oxygen saturations were monitored continuously. The                            CF [id number] #[id number] was introduced through the anus                            and advanced to the the cecum, identified by                            appendiceal orifice and ileocecal valve. The                            ileocecal valve, appendiceal orifice, and rectum                            were photographed. The quality of the bowel                            preparation was excellent. The colonoscopy was                            performed without difficulty. The patient tolerated                            the procedure well. The bowel preparation used was                            SUPREP via split dose instruction. Scope In: 2:54:34 PM Scope Out: 3:06:21 PM Scope Withdrawal Time: 0 hours 9 minutes 50 seconds  Total Procedure Duration: 0 hours 11 minutes 47 seconds  Findings:                 Two polyps were found in the ascending colon. The                            polyps were 3 to 4 mm in size. These polyps  were                            removed with a cold snare. Resection and retrieval                            were complete.                           A few diverticula were found in the sigmoid colon.                            Healthy appearing surgical anastomosis at                            approximately 20 cm from the anal verge.                           Internal hemorrhoids were found during retroflexion.                           The exam was otherwise without abnormality on                            direct and retroflexion views. Complications:            No immediate complications. Estimated blood loss:                            None. Estimated Blood Loss:     Estimated blood loss: none. Impression:               - Two 3 to 4 mm polyps in the ascending colon,                            removed with a cold snare. Resected and  retrieved.                           - Diverticulosis in the sigmoid colon. Status post                            sigmoid colectomy.                           - Internal hemorrhoids.                           - The examination was otherwise normal on direct                            and retroflexion views. Recommendation:           - Repeat colonoscopy in 1 year for surveillance.                           - Patient has a contact number available for  emergencies. The signs and symptoms of potential                            delayed complications were discussed with the                            patient. Return to normal activities tomorrow.                            Written discharge instructions were provided to the                            patient.                           - Resume previous diet.                           - Continue present medications.                           - Await pathology results. Murel Arlington. Elvin Hammer, MD 12/12/2023 3:11:34 PM This report has been signed electronically.

## 2023-12-12 NOTE — Progress Notes (Signed)
 HISTORY OF PRESENT ILLNESS:  Brandon Ruiz is a 48 y.o. male with a personal history colon cancer and adenomatous colon polyps.  Status post resection last year followed by adjuvant chemotherapy.  Now for his first follow-up exam  REVIEW OF SYSTEMS:  All non-GI ROS negative except for  Past Medical History:  Diagnosis Date   Anemia    Diabetes mellitus without complication (HCC)    Hyperlipidemia    Hypertension    RBBB     Past Surgical History:  Procedure Laterality Date   APPENDECTOMY     COLONOSCOPY     INCISION AND DRAINAGE OF WOUND Right 05/22/2022   Procedure: IRRIGATION AND DEBRIDEMENT RIGHT GROIN;  Surgeon: Kinsinger, Alphonso Aschoff, MD;  Location: [facility];  Service: General;  Laterality: Right;   IR IMAGING GUIDED PORT INSERTION  01/08/2023   IR REMOVAL TUN ACCESS W/ PORT W/O FL MOD SED  05/10/2023   TONSILLECTOMY      Social History Brandon Ruiz  reports that he has never smoked. He has never used smokeless tobacco. He reports current alcohol use of about 3.0 standard drinks of alcohol per week. He reports that he does not use drugs.  family history includes Lung cancer in his father.  Allergies  Allergen Reactions   Penicillins Itching       PHYSICAL EXAMINATION: Vital signs: BP 121/79   Pulse 82   Temp 98.1 F (36.7 C) (Skin)   Resp 13   Ht 6' 4 (1.93 m)   Wt 254 lb (115.2 kg)   SpO2 96%   BMI 30.92 kg/m  General: Well-developed, well-nourished, no acute distress HEENT: Sclerae are anicteric, conjunctiva pink. Oral mucosa intact Lungs: Clear Heart: Regular Abdomen: soft, nontender, nondistended, no obvious ascites, no peritoneal signs, normal bowel sounds. No organomegaly. Extremities: No edema Psychiatric: alert and oriented x3. Cooperative     ASSESSMENT:  Personal history of colon cancer and colon polyps   PLAN:  Surveillance colonoscopy

## 2023-12-12 NOTE — Progress Notes (Signed)
 Report to PACU, RN, vss, BBS= Clear.

## 2023-12-13 ENCOUNTER — Telehealth: Payer: Self-pay

## 2023-12-13 NOTE — Telephone Encounter (Signed)
No answer on follow up call. No voicemail.  

## 2023-12-17 ENCOUNTER — Ambulatory Visit: Payer: Self-pay | Admitting: Internal Medicine

## 2023-12-17 LAB — SURGICAL PATHOLOGY

## 2024-04-28 ENCOUNTER — Encounter: Payer: Self-pay | Admitting: Radiology

## 2024-04-30 ENCOUNTER — Other Ambulatory Visit: Payer: Self-pay | Admitting: Internal Medicine

## 2024-05-22 ENCOUNTER — Other Ambulatory Visit: Payer: Self-pay

## 2024-06-05 ENCOUNTER — Encounter: Payer: Self-pay | Admitting: Nurse Practitioner

## 2024-06-05 ENCOUNTER — Ambulatory Visit: Admitting: Oncology

## 2024-06-05 ENCOUNTER — Telehealth: Payer: Self-pay

## 2024-06-05 ENCOUNTER — Inpatient Hospital Stay: Attending: Nurse Practitioner

## 2024-06-05 ENCOUNTER — Inpatient Hospital Stay (HOSPITAL_BASED_OUTPATIENT_CLINIC_OR_DEPARTMENT_OTHER): Admitting: Nurse Practitioner

## 2024-06-05 VITALS — BP 159/105 | HR 100 | Temp 97.8°F | Resp 18 | Ht 76.0 in | Wt 257.0 lb

## 2024-06-05 DIAGNOSIS — C187 Malignant neoplasm of sigmoid colon: Secondary | ICD-10-CM

## 2024-06-05 DIAGNOSIS — Z9221 Personal history of antineoplastic chemotherapy: Secondary | ICD-10-CM | POA: Insufficient documentation

## 2024-06-05 DIAGNOSIS — Z08 Encounter for follow-up examination after completed treatment for malignant neoplasm: Secondary | ICD-10-CM | POA: Diagnosis present

## 2024-06-05 DIAGNOSIS — Z85038 Personal history of other malignant neoplasm of large intestine: Secondary | ICD-10-CM | POA: Diagnosis not present

## 2024-06-05 LAB — CEA (ACCESS): CEA (CHCC): 1.31 ng/mL (ref 0.00–5.00)

## 2024-06-05 NOTE — Telephone Encounter (Signed)
 Patient voiced understanding, no further questions at this time.

## 2024-06-05 NOTE — Telephone Encounter (Signed)
-----   Message from Brandon Ned, NP sent at 06/05/2024  1:45 PM EST ----- Please let him know CEA is in normal range.  Follow-up as scheduled.

## 2024-06-05 NOTE — Progress Notes (Signed)
°  [facility] OFFICE PROGRESS NOTE   Diagnosis: Colon cancer  INTERVAL HISTORY:   Brandon Ruiz returns as scheduled.  He feels well.  He has a good appetite and good energy level.  Bowels moving regularly.  No blood or pain with bowel movements.  No abdominal pain.  Occasional cold sensation fourth digit right hand.  No other neuropathy symptoms.  He forgot to take his blood pressure medication this morning.  Objective:  Vital signs in last 24 hours:  Blood pressure (!) 159/105, pulse 100, temperature 97.8 F (36.6 C), temperature source Temporal, resp. rate 18, height 6' 4 (1.93 m), weight 257 lb (116.6 kg), SpO2 98%.    HEENT: No thrush or ulcers. Lymphatics: No palpable cervical, supraclavicular, axillary or inguinal lymph nodes. Resp: Lungs clear bilaterally. Cardio: Regular rate and rhythm. GI: No hepatosplenomegaly.  Nontender.  No mass. Vascular: No leg edema.   Lab Results:  Lab Results  Component Value Date   WBC 6.4 09/19/2023   HGB 17.3 (H) 09/19/2023   HCT 51.5 09/19/2023   MCV 92.7 09/19/2023   PLT 246.0 09/19/2023   NEUTROABS 4.0 09/19/2023    Imaging:  No results found.  Medications: I have reviewed the patient's current medications.  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 and areas spacious for invasion Well to moderately differentiated adenocarcinoma, tumor invades through muscularis propria, no lymphovascular or perineural invasion, negative margins, no macroscopic tumor perforation, no tumor deposits, 1/14 nodes, no loss of mismatch repair protein expression Normal preoperative CEA CTs 10/31/2022-sigmoid wall thickening, no evidence of metastatic disease, calcified right hilar lymph nodes and 14 mm right upper  lobe nodule with adjacent tiny nodules favored to reflect prior granulomatous disease Cycle 1 CAPOX 01/11/2023 Cycle 2 CAPOX 01/31/2023 Cycle 3 CAPOX 02/22/2023 Cycle 4 CAPOX 03/14/2023, oxaliplatin  held due to neuropathy Cycle 1 capecitabine  04/23/2023 Cycle 2 capecitabine  05/14/2023 Cycle 3 capecitabine  06/04/2023 06/18/2023: Guardant reveal-negative Cycle 4 capecitabine  06/25/2023 CT 11/29/2023: No evidence of recurrent disease, 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  Multiple colon polyps removed on colonoscopy 10/19/2022-tubular adenomas and tubulovillous adenomas 3.  Nodular left thyroid  on CT 10/31/2022 Thyroid  ultrasound 11/29/2022-multinodular goiter, 3 nodules meet criteria for 1 year follow-up imaging Thyroid  ultrasound 11/29/2023-no change in size or appearance of TI-RADS category 3 nodules in the left thyroid , 1 year follow-up ultrasound recommended   4.  Diabetes 5.  Hypertension 6.  Admission with GI bleeding following the colonoscopy 10/19/2022 7.  Hyperlipidemia    Disposition: Brandon Ruiz remains in clinical remission from colon cancer.  We will follow-up on the CEA from today.  He will have surveillance CT scans and a follow-up thyroid  ultrasound in 6 months.  We discussed the elevated blood pressure reading.  He forgot to take his medication this morning.  He monitors his blood pressure at home and generally notes good readings.  He will return for follow-up in 6 months.  We are available to see him sooner if needed.    Brandon Ruiz ANP/GNP-BC   06/05/2024  9:42 AM

## 2024-06-06 ENCOUNTER — Other Ambulatory Visit: Payer: Self-pay

## 2024-06-20 ENCOUNTER — Other Ambulatory Visit: Payer: Self-pay | Admitting: Internal Medicine

## 2024-07-14 ENCOUNTER — Other Ambulatory Visit: Payer: Self-pay | Admitting: Internal Medicine

## 2024-07-30 ENCOUNTER — Other Ambulatory Visit: Payer: Self-pay | Admitting: Internal Medicine

## 2024-12-01 ENCOUNTER — Inpatient Hospital Stay: Admitting: Oncology
